# Patient Record
Sex: Female | Born: 1985
Health system: Southern US, Community
[De-identification: ages and names within clinical notes are randomized; demographics above are authoritative.]

## PROBLEM LIST (undated history)

## (undated) ENCOUNTER — Ambulatory Visit: Admission: EM | Payer: 59

## (undated) ENCOUNTER — Emergency Department (HOSPITAL_COMMUNITY): Discharge status: Absent without leave | Payer: Self-pay

## (undated) DIAGNOSIS — F32A Depression, unspecified: Secondary | ICD-10-CM

## (undated) DIAGNOSIS — G5600 Carpal tunnel syndrome, unspecified upper limb: Secondary | ICD-10-CM

## (undated) DIAGNOSIS — K5790 Diverticulosis of intestine, part unspecified, without perforation or abscess without bleeding: Secondary | ICD-10-CM

## (undated) DIAGNOSIS — E669 Obesity, unspecified: Secondary | ICD-10-CM

## (undated) DIAGNOSIS — F419 Anxiety disorder, unspecified: Secondary | ICD-10-CM

## (undated) HISTORY — PX: CHOLECYSTECTOMY: SHX55

## (undated) HISTORY — PX: WISDOM TOOTH EXTRACTION: SHX21

## (undated) HISTORY — DX: Depression, unspecified: F32.A

## (undated) HISTORY — DX: Carpal tunnel syndrome, unspecified upper limb: G56.00

## (undated) HISTORY — DX: Anxiety disorder, unspecified: F41.9

## (undated) HISTORY — PX: TUBAL LIGATION: SHX77

## (undated) HISTORY — PX: MOUTH SURGERY: SHX715

## (undated) HISTORY — DX: Obesity, unspecified: E66.9

---
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
## 2003-05-05 ENCOUNTER — Emergency Department (HOSPITAL_COMMUNITY): Admission: AD | Admit: 2003-05-05 | Discharge: 2003-05-05 | Payer: Self-pay | Admitting: Family Medicine

## 2003-08-06 ENCOUNTER — Inpatient Hospital Stay (HOSPITAL_COMMUNITY): Admission: AD | Admit: 2003-08-06 | Discharge: 2003-08-07 | Payer: Self-pay | Admitting: Obstetrics and Gynecology

## 2003-08-16 ENCOUNTER — Inpatient Hospital Stay (HOSPITAL_COMMUNITY): Admission: AD | Admit: 2003-08-16 | Discharge: 2003-08-16 | Payer: Self-pay | Admitting: Obstetrics and Gynecology

## 2003-09-11 ENCOUNTER — Inpatient Hospital Stay (HOSPITAL_COMMUNITY): Admission: AD | Admit: 2003-09-11 | Discharge: 2003-09-11 | Payer: Self-pay | Admitting: Obstetrics and Gynecology

## 2003-10-04 ENCOUNTER — Inpatient Hospital Stay (HOSPITAL_COMMUNITY): Admission: AD | Admit: 2003-10-04 | Discharge: 2003-10-04 | Payer: Self-pay | Admitting: Obstetrics and Gynecology

## 2003-11-08 ENCOUNTER — Inpatient Hospital Stay (HOSPITAL_COMMUNITY): Admission: AD | Admit: 2003-11-08 | Discharge: 2003-11-10 | Payer: Self-pay | Admitting: Obstetrics and Gynecology

## 2003-11-12 ENCOUNTER — Inpatient Hospital Stay (HOSPITAL_COMMUNITY): Admission: AD | Admit: 2003-11-12 | Discharge: 2003-11-12 | Payer: Self-pay | Admitting: Obstetrics and Gynecology

## 2003-12-01 ENCOUNTER — Observation Stay (HOSPITAL_COMMUNITY): Admission: AD | Admit: 2003-12-01 | Discharge: 2003-12-02 | Payer: Self-pay | Admitting: Obstetrics & Gynecology

## 2003-12-03 ENCOUNTER — Inpatient Hospital Stay (HOSPITAL_COMMUNITY): Admission: AD | Admit: 2003-12-03 | Discharge: 2003-12-03 | Payer: Self-pay | Admitting: Obstetrics and Gynecology

## 2003-12-04 ENCOUNTER — Observation Stay (HOSPITAL_COMMUNITY): Admission: AD | Admit: 2003-12-04 | Discharge: 2003-12-04 | Payer: Self-pay | Admitting: Obstetrics and Gynecology

## 2003-12-13 ENCOUNTER — Inpatient Hospital Stay (HOSPITAL_COMMUNITY): Admission: RE | Admit: 2003-12-13 | Discharge: 2003-12-16 | Payer: Self-pay | Admitting: Obstetrics and Gynecology

## 2003-12-13 ENCOUNTER — Encounter (INDEPENDENT_AMBULATORY_CARE_PROVIDER_SITE_OTHER): Payer: Self-pay | Admitting: Specialist

## 2004-01-04 ENCOUNTER — Emergency Department (HOSPITAL_COMMUNITY): Admission: EM | Admit: 2004-01-04 | Discharge: 2004-01-04 | Payer: Self-pay | Admitting: Emergency Medicine

## 2004-01-07 ENCOUNTER — Emergency Department (HOSPITAL_COMMUNITY): Admission: EM | Admit: 2004-01-07 | Discharge: 2004-01-08 | Payer: Self-pay | Admitting: Emergency Medicine

## 2004-01-31 ENCOUNTER — Emergency Department (HOSPITAL_COMMUNITY): Admission: EM | Admit: 2004-01-31 | Discharge: 2004-01-31 | Payer: Self-pay | Admitting: Emergency Medicine

## 2004-02-01 ENCOUNTER — Ambulatory Visit (HOSPITAL_COMMUNITY): Admission: RE | Admit: 2004-02-01 | Discharge: 2004-02-01 | Payer: Self-pay | Admitting: General Surgery

## 2004-02-01 ENCOUNTER — Encounter (INDEPENDENT_AMBULATORY_CARE_PROVIDER_SITE_OTHER): Payer: Self-pay | Admitting: *Deleted

## 2004-02-05 ENCOUNTER — Other Ambulatory Visit: Admission: RE | Admit: 2004-02-05 | Discharge: 2004-02-05 | Payer: Self-pay | Admitting: Obstetrics and Gynecology

## 2004-04-04 ENCOUNTER — Emergency Department (HOSPITAL_COMMUNITY): Admission: EM | Admit: 2004-04-04 | Discharge: 2004-04-04 | Payer: Self-pay

## 2004-04-25 ENCOUNTER — Emergency Department (HOSPITAL_COMMUNITY): Admission: EM | Admit: 2004-04-25 | Discharge: 2004-04-26 | Payer: Self-pay | Admitting: Emergency Medicine

## 2004-06-27 ENCOUNTER — Emergency Department (HOSPITAL_COMMUNITY): Admission: EM | Admit: 2004-06-27 | Discharge: 2004-06-27 | Payer: Self-pay | Admitting: Emergency Medicine

## 2004-06-29 ENCOUNTER — Emergency Department (HOSPITAL_COMMUNITY): Admission: EM | Admit: 2004-06-29 | Discharge: 2004-06-30 | Payer: Self-pay

## 2004-06-30 ENCOUNTER — Emergency Department (HOSPITAL_COMMUNITY): Admission: EM | Admit: 2004-06-30 | Discharge: 2004-06-30 | Payer: Self-pay | Admitting: *Deleted

## 2004-08-13 ENCOUNTER — Emergency Department (HOSPITAL_COMMUNITY): Admission: EM | Admit: 2004-08-13 | Discharge: 2004-08-13 | Payer: Self-pay | Admitting: Emergency Medicine

## 2004-10-05 ENCOUNTER — Emergency Department (HOSPITAL_COMMUNITY): Admission: EM | Admit: 2004-10-05 | Discharge: 2004-10-05 | Payer: Self-pay | Admitting: Emergency Medicine

## 2004-10-15 ENCOUNTER — Emergency Department (HOSPITAL_COMMUNITY): Admission: EM | Admit: 2004-10-15 | Discharge: 2004-10-15 | Payer: Self-pay | Admitting: Emergency Medicine

## 2004-11-23 ENCOUNTER — Emergency Department (HOSPITAL_COMMUNITY): Admission: EM | Admit: 2004-11-23 | Discharge: 2004-11-23 | Payer: Self-pay | Admitting: Emergency Medicine

## 2004-11-25 ENCOUNTER — Emergency Department (HOSPITAL_COMMUNITY): Admission: EM | Admit: 2004-11-25 | Discharge: 2004-11-26 | Payer: Self-pay | Admitting: Emergency Medicine

## 2004-12-16 ENCOUNTER — Emergency Department (HOSPITAL_COMMUNITY): Admission: EM | Admit: 2004-12-16 | Discharge: 2004-12-16 | Payer: Self-pay | Admitting: Emergency Medicine

## 2005-01-02 ENCOUNTER — Emergency Department (HOSPITAL_COMMUNITY): Admission: EM | Admit: 2005-01-02 | Discharge: 2005-01-02 | Payer: Self-pay | Admitting: Emergency Medicine

## 2005-01-10 ENCOUNTER — Emergency Department (HOSPITAL_COMMUNITY): Admission: EM | Admit: 2005-01-10 | Discharge: 2005-01-10 | Payer: Self-pay | Admitting: Emergency Medicine

## 2005-01-14 ENCOUNTER — Emergency Department (HOSPITAL_COMMUNITY): Admission: EM | Admit: 2005-01-14 | Discharge: 2005-01-14 | Payer: Self-pay | Admitting: Emergency Medicine

## 2005-01-15 ENCOUNTER — Emergency Department (HOSPITAL_COMMUNITY): Admission: EM | Admit: 2005-01-15 | Discharge: 2005-01-15 | Payer: Self-pay | Admitting: Emergency Medicine

## 2005-01-27 ENCOUNTER — Emergency Department (HOSPITAL_COMMUNITY): Admission: EM | Admit: 2005-01-27 | Discharge: 2005-01-28 | Payer: Self-pay | Admitting: Emergency Medicine

## 2005-01-28 ENCOUNTER — Emergency Department (HOSPITAL_COMMUNITY): Admission: EM | Admit: 2005-01-28 | Discharge: 2005-01-28 | Payer: Self-pay | Admitting: Emergency Medicine

## 2005-01-29 ENCOUNTER — Emergency Department (HOSPITAL_COMMUNITY): Admission: EM | Admit: 2005-01-29 | Discharge: 2005-01-29 | Payer: Self-pay | Admitting: *Deleted

## 2005-02-05 ENCOUNTER — Emergency Department (HOSPITAL_COMMUNITY): Admission: EM | Admit: 2005-02-05 | Discharge: 2005-02-05 | Payer: Self-pay | Admitting: Emergency Medicine

## 2005-02-11 ENCOUNTER — Emergency Department (HOSPITAL_COMMUNITY): Admission: EM | Admit: 2005-02-11 | Discharge: 2005-02-12 | Payer: Self-pay | Admitting: Emergency Medicine

## 2005-02-14 ENCOUNTER — Emergency Department (HOSPITAL_COMMUNITY): Admission: EM | Admit: 2005-02-14 | Discharge: 2005-02-14 | Payer: Self-pay | Admitting: Emergency Medicine

## 2005-02-23 ENCOUNTER — Emergency Department (HOSPITAL_COMMUNITY): Admission: EM | Admit: 2005-02-23 | Discharge: 2005-02-23 | Payer: Self-pay | Admitting: Emergency Medicine

## 2005-03-10 ENCOUNTER — Emergency Department (HOSPITAL_COMMUNITY): Admission: EM | Admit: 2005-03-10 | Discharge: 2005-03-10 | Payer: Self-pay | Admitting: Emergency Medicine

## 2005-03-17 ENCOUNTER — Emergency Department (HOSPITAL_COMMUNITY): Admission: EM | Admit: 2005-03-17 | Discharge: 2005-03-17 | Payer: Self-pay | Admitting: Emergency Medicine

## 2005-04-07 ENCOUNTER — Emergency Department (HOSPITAL_COMMUNITY): Admission: EM | Admit: 2005-04-07 | Discharge: 2005-04-07 | Payer: Self-pay | Admitting: Family Medicine

## 2005-04-16 ENCOUNTER — Emergency Department (HOSPITAL_COMMUNITY): Admission: EM | Admit: 2005-04-16 | Discharge: 2005-04-16 | Payer: Self-pay | Admitting: Emergency Medicine

## 2005-04-25 ENCOUNTER — Emergency Department (HOSPITAL_COMMUNITY): Admission: EM | Admit: 2005-04-25 | Discharge: 2005-04-25 | Payer: Self-pay | Admitting: Emergency Medicine

## 2005-05-18 ENCOUNTER — Emergency Department (HOSPITAL_COMMUNITY): Admission: EM | Admit: 2005-05-18 | Discharge: 2005-05-18 | Payer: Self-pay | Admitting: Family Medicine

## 2005-06-15 ENCOUNTER — Emergency Department (HOSPITAL_COMMUNITY): Admission: EM | Admit: 2005-06-15 | Discharge: 2005-06-16 | Payer: Self-pay | Admitting: Emergency Medicine

## 2005-06-26 ENCOUNTER — Emergency Department (HOSPITAL_COMMUNITY): Admission: EM | Admit: 2005-06-26 | Discharge: 2005-06-27 | Payer: Self-pay | Admitting: Emergency Medicine

## 2005-07-06 ENCOUNTER — Emergency Department (HOSPITAL_COMMUNITY): Admission: EM | Admit: 2005-07-06 | Discharge: 2005-07-06 | Payer: Self-pay | Admitting: Emergency Medicine

## 2005-08-10 ENCOUNTER — Emergency Department (HOSPITAL_COMMUNITY): Admission: EM | Admit: 2005-08-10 | Discharge: 2005-08-10 | Payer: Self-pay | Admitting: Emergency Medicine

## 2005-08-13 ENCOUNTER — Emergency Department (HOSPITAL_COMMUNITY): Admission: EM | Admit: 2005-08-13 | Discharge: 2005-08-14 | Payer: Self-pay | Admitting: Emergency Medicine

## 2005-08-18 ENCOUNTER — Inpatient Hospital Stay (HOSPITAL_COMMUNITY): Admission: AD | Admit: 2005-08-18 | Discharge: 2005-08-18 | Payer: Self-pay | Admitting: Obstetrics and Gynecology

## 2005-09-02 ENCOUNTER — Other Ambulatory Visit: Admission: RE | Admit: 2005-09-02 | Discharge: 2005-09-02 | Payer: Self-pay | Admitting: Obstetrics and Gynecology

## 2005-09-04 ENCOUNTER — Inpatient Hospital Stay (HOSPITAL_COMMUNITY): Admission: AD | Admit: 2005-09-04 | Discharge: 2005-09-04 | Payer: Self-pay | Admitting: Obstetrics and Gynecology

## 2005-09-04 ENCOUNTER — Encounter: Payer: Self-pay | Admitting: Emergency Medicine

## 2005-09-15 ENCOUNTER — Inpatient Hospital Stay (HOSPITAL_COMMUNITY): Admission: AD | Admit: 2005-09-15 | Discharge: 2005-09-16 | Payer: Self-pay | Admitting: Obstetrics and Gynecology

## 2005-09-21 ENCOUNTER — Inpatient Hospital Stay (HOSPITAL_COMMUNITY): Admission: AD | Admit: 2005-09-21 | Discharge: 2005-09-21 | Payer: Self-pay | Admitting: Obstetrics & Gynecology

## 2005-10-03 ENCOUNTER — Inpatient Hospital Stay (HOSPITAL_COMMUNITY): Admission: AD | Admit: 2005-10-03 | Discharge: 2005-10-03 | Payer: Self-pay | Admitting: Obstetrics and Gynecology

## 2005-10-12 ENCOUNTER — Inpatient Hospital Stay (HOSPITAL_COMMUNITY): Admission: AD | Admit: 2005-10-12 | Discharge: 2005-10-12 | Payer: Self-pay | Admitting: Obstetrics and Gynecology

## 2005-10-20 ENCOUNTER — Inpatient Hospital Stay (HOSPITAL_COMMUNITY): Admission: AD | Admit: 2005-10-20 | Discharge: 2005-10-21 | Payer: Self-pay | Admitting: Obstetrics and Gynecology

## 2005-11-24 ENCOUNTER — Inpatient Hospital Stay (HOSPITAL_COMMUNITY): Admission: AD | Admit: 2005-11-24 | Discharge: 2005-11-24 | Payer: Self-pay | Admitting: Obstetrics and Gynecology

## 2005-12-02 ENCOUNTER — Inpatient Hospital Stay (HOSPITAL_COMMUNITY): Admission: AD | Admit: 2005-12-02 | Discharge: 2005-12-03 | Payer: Self-pay | Admitting: Obstetrics and Gynecology

## 2005-12-28 ENCOUNTER — Inpatient Hospital Stay (HOSPITAL_COMMUNITY): Admission: AD | Admit: 2005-12-28 | Discharge: 2005-12-28 | Payer: Self-pay | Admitting: Obstetrics and Gynecology

## 2006-01-18 ENCOUNTER — Inpatient Hospital Stay (HOSPITAL_COMMUNITY): Admission: AD | Admit: 2006-01-18 | Discharge: 2006-01-19 | Payer: Self-pay | Admitting: Obstetrics & Gynecology

## 2006-01-30 ENCOUNTER — Inpatient Hospital Stay (HOSPITAL_COMMUNITY): Admission: AD | Admit: 2006-01-30 | Discharge: 2006-01-30 | Payer: Self-pay | Admitting: Obstetrics and Gynecology

## 2006-02-09 ENCOUNTER — Inpatient Hospital Stay (HOSPITAL_COMMUNITY): Admission: AD | Admit: 2006-02-09 | Discharge: 2006-02-10 | Payer: Self-pay | Admitting: Obstetrics and Gynecology

## 2006-02-11 ENCOUNTER — Inpatient Hospital Stay (HOSPITAL_COMMUNITY): Admission: AD | Admit: 2006-02-11 | Discharge: 2006-02-11 | Payer: Self-pay | Admitting: Obstetrics and Gynecology

## 2006-02-19 ENCOUNTER — Inpatient Hospital Stay (HOSPITAL_COMMUNITY): Admission: AD | Admit: 2006-02-19 | Discharge: 2006-02-19 | Payer: Self-pay | Admitting: Obstetrics and Gynecology

## 2006-02-28 ENCOUNTER — Inpatient Hospital Stay (HOSPITAL_COMMUNITY): Admission: AD | Admit: 2006-02-28 | Discharge: 2006-02-28 | Payer: Self-pay | Admitting: Obstetrics and Gynecology

## 2006-03-04 ENCOUNTER — Inpatient Hospital Stay (HOSPITAL_COMMUNITY): Admission: AD | Admit: 2006-03-04 | Discharge: 2006-03-04 | Payer: Self-pay | Admitting: Obstetrics and Gynecology

## 2006-03-12 ENCOUNTER — Inpatient Hospital Stay (HOSPITAL_COMMUNITY): Admission: AD | Admit: 2006-03-12 | Discharge: 2006-03-15 | Payer: Self-pay | Admitting: Obstetrics and Gynecology

## 2006-03-12 ENCOUNTER — Encounter (INDEPENDENT_AMBULATORY_CARE_PROVIDER_SITE_OTHER): Payer: Self-pay | Admitting: Specialist

## 2006-03-19 ENCOUNTER — Inpatient Hospital Stay (HOSPITAL_COMMUNITY): Admission: RE | Admit: 2006-03-19 | Discharge: 2006-03-19 | Payer: Self-pay | Admitting: Obstetrics and Gynecology

## 2006-03-20 ENCOUNTER — Inpatient Hospital Stay (HOSPITAL_COMMUNITY): Admission: AD | Admit: 2006-03-20 | Discharge: 2006-03-20 | Payer: Self-pay | Admitting: Obstetrics and Gynecology

## 2006-03-21 ENCOUNTER — Inpatient Hospital Stay (HOSPITAL_COMMUNITY): Admission: AD | Admit: 2006-03-21 | Discharge: 2006-03-21 | Payer: Self-pay | Admitting: *Deleted

## 2006-04-16 ENCOUNTER — Emergency Department (HOSPITAL_COMMUNITY): Admission: EM | Admit: 2006-04-16 | Discharge: 2006-04-16 | Payer: Self-pay | Admitting: Emergency Medicine

## 2006-05-30 ENCOUNTER — Emergency Department (HOSPITAL_COMMUNITY): Admission: EM | Admit: 2006-05-30 | Discharge: 2006-05-31 | Payer: Self-pay | Admitting: Emergency Medicine

## 2006-06-06 ENCOUNTER — Emergency Department (HOSPITAL_COMMUNITY): Admission: EM | Admit: 2006-06-06 | Discharge: 2006-06-06 | Payer: Self-pay | Admitting: Emergency Medicine

## 2006-06-17 ENCOUNTER — Emergency Department (HOSPITAL_COMMUNITY): Admission: EM | Admit: 2006-06-17 | Discharge: 2006-06-17 | Payer: Self-pay | Admitting: Emergency Medicine

## 2006-07-23 ENCOUNTER — Emergency Department (HOSPITAL_COMMUNITY): Admission: EM | Admit: 2006-07-23 | Discharge: 2006-07-23 | Payer: Self-pay | Admitting: Emergency Medicine

## 2006-08-10 ENCOUNTER — Emergency Department (HOSPITAL_COMMUNITY): Admission: EM | Admit: 2006-08-10 | Discharge: 2006-08-10 | Payer: Self-pay | Admitting: Emergency Medicine

## 2006-09-06 ENCOUNTER — Emergency Department (HOSPITAL_COMMUNITY): Admission: EM | Admit: 2006-09-06 | Discharge: 2006-09-06 | Payer: Self-pay | Admitting: Emergency Medicine

## 2006-10-17 ENCOUNTER — Emergency Department (HOSPITAL_COMMUNITY): Admission: EM | Admit: 2006-10-17 | Discharge: 2006-10-18 | Payer: Self-pay | Admitting: Emergency Medicine

## 2006-10-24 ENCOUNTER — Emergency Department (HOSPITAL_COMMUNITY): Admission: EM | Admit: 2006-10-24 | Discharge: 2006-10-24 | Payer: Self-pay | Admitting: *Deleted

## 2006-11-15 ENCOUNTER — Emergency Department (HOSPITAL_COMMUNITY): Admission: EM | Admit: 2006-11-15 | Discharge: 2006-11-15 | Payer: Self-pay | Admitting: Emergency Medicine

## 2006-12-02 ENCOUNTER — Emergency Department (HOSPITAL_COMMUNITY): Admission: EM | Admit: 2006-12-02 | Discharge: 2006-12-02 | Payer: Self-pay | Admitting: Emergency Medicine

## 2006-12-04 ENCOUNTER — Emergency Department (HOSPITAL_COMMUNITY): Admission: EM | Admit: 2006-12-04 | Discharge: 2006-12-04 | Payer: Self-pay | Admitting: Emergency Medicine

## 2006-12-05 ENCOUNTER — Emergency Department (HOSPITAL_COMMUNITY): Admission: EM | Admit: 2006-12-05 | Discharge: 2006-12-05 | Payer: Self-pay | Admitting: Emergency Medicine

## 2006-12-08 ENCOUNTER — Emergency Department (HOSPITAL_COMMUNITY): Admission: EM | Admit: 2006-12-08 | Discharge: 2006-12-08 | Payer: Self-pay | Admitting: Emergency Medicine

## 2006-12-13 ENCOUNTER — Emergency Department (HOSPITAL_COMMUNITY): Admission: EM | Admit: 2006-12-13 | Discharge: 2006-12-13 | Payer: Self-pay | Admitting: Emergency Medicine

## 2007-01-16 ENCOUNTER — Emergency Department (HOSPITAL_COMMUNITY): Admission: EM | Admit: 2007-01-16 | Discharge: 2007-01-16 | Payer: Self-pay | Admitting: Emergency Medicine

## 2007-02-12 ENCOUNTER — Emergency Department (HOSPITAL_COMMUNITY): Admission: EM | Admit: 2007-02-12 | Discharge: 2007-02-12 | Payer: Self-pay | Admitting: Family Medicine

## 2007-03-06 ENCOUNTER — Emergency Department (HOSPITAL_COMMUNITY): Admission: EM | Admit: 2007-03-06 | Discharge: 2007-03-06 | Payer: Self-pay | Admitting: Emergency Medicine

## 2007-03-30 ENCOUNTER — Emergency Department (HOSPITAL_COMMUNITY): Admission: EM | Admit: 2007-03-30 | Discharge: 2007-03-30 | Payer: Self-pay | Admitting: Emergency Medicine

## 2007-04-11 ENCOUNTER — Emergency Department (HOSPITAL_COMMUNITY): Admission: EM | Admit: 2007-04-11 | Discharge: 2007-04-11 | Payer: Self-pay | Admitting: Emergency Medicine

## 2007-06-05 ENCOUNTER — Emergency Department (HOSPITAL_COMMUNITY): Admission: EM | Admit: 2007-06-05 | Discharge: 2007-06-06 | Payer: Self-pay | Admitting: Emergency Medicine

## 2007-07-01 ENCOUNTER — Emergency Department (HOSPITAL_COMMUNITY): Admission: EM | Admit: 2007-07-01 | Discharge: 2007-07-01 | Payer: Self-pay | Admitting: Family Medicine

## 2007-07-02 ENCOUNTER — Emergency Department (HOSPITAL_COMMUNITY): Admission: EM | Admit: 2007-07-02 | Discharge: 2007-07-02 | Payer: Self-pay | Admitting: Emergency Medicine

## 2007-08-31 ENCOUNTER — Emergency Department (HOSPITAL_COMMUNITY): Admission: EM | Admit: 2007-08-31 | Discharge: 2007-08-31 | Payer: Self-pay | Admitting: Emergency Medicine

## 2007-09-11 ENCOUNTER — Emergency Department (HOSPITAL_COMMUNITY): Admission: EM | Admit: 2007-09-11 | Discharge: 2007-09-11 | Payer: Self-pay | Admitting: Emergency Medicine

## 2007-10-11 ENCOUNTER — Emergency Department (HOSPITAL_COMMUNITY): Admission: EM | Admit: 2007-10-11 | Discharge: 2007-10-11 | Payer: Self-pay | Admitting: Family Medicine

## 2007-11-16 ENCOUNTER — Emergency Department (HOSPITAL_COMMUNITY): Admission: EM | Admit: 2007-11-16 | Discharge: 2007-11-16 | Payer: Self-pay | Admitting: Emergency Medicine

## 2007-11-18 ENCOUNTER — Emergency Department (HOSPITAL_COMMUNITY): Admission: EM | Admit: 2007-11-18 | Discharge: 2007-11-18 | Payer: Self-pay | Admitting: Emergency Medicine

## 2008-08-09 ENCOUNTER — Emergency Department (HOSPITAL_COMMUNITY): Admission: EM | Admit: 2008-08-09 | Discharge: 2008-08-09 | Payer: Self-pay | Admitting: Internal Medicine

## 2008-09-19 ENCOUNTER — Emergency Department (HOSPITAL_COMMUNITY): Admission: EM | Admit: 2008-09-19 | Discharge: 2008-09-19 | Payer: Self-pay | Admitting: Emergency Medicine

## 2009-01-04 ENCOUNTER — Emergency Department (HOSPITAL_COMMUNITY): Admission: EM | Admit: 2009-01-04 | Discharge: 2009-01-04 | Payer: Self-pay | Admitting: Emergency Medicine

## 2009-04-05 ENCOUNTER — Emergency Department (HOSPITAL_COMMUNITY): Admission: EM | Admit: 2009-04-05 | Discharge: 2009-04-06 | Payer: Self-pay | Admitting: Emergency Medicine

## 2009-04-17 ENCOUNTER — Emergency Department (HOSPITAL_COMMUNITY): Admission: EM | Admit: 2009-04-17 | Discharge: 2009-04-17 | Payer: Self-pay | Admitting: Emergency Medicine

## 2009-09-04 ENCOUNTER — Emergency Department (HOSPITAL_COMMUNITY): Admission: EM | Admit: 2009-09-04 | Discharge: 2009-09-05 | Payer: Self-pay | Admitting: Emergency Medicine

## 2010-01-11 ENCOUNTER — Emergency Department (HOSPITAL_COMMUNITY): Admission: EM | Admit: 2010-01-11 | Discharge: 2010-01-11 | Payer: Self-pay | Admitting: Emergency Medicine

## 2010-02-04 ENCOUNTER — Emergency Department (HOSPITAL_COMMUNITY): Admission: EM | Admit: 2010-02-04 | Discharge: 2010-02-04 | Payer: Self-pay | Admitting: Emergency Medicine

## 2010-02-09 ENCOUNTER — Emergency Department (HOSPITAL_COMMUNITY): Admission: EM | Admit: 2010-02-09 | Discharge: 2010-02-10 | Payer: Self-pay | Admitting: Emergency Medicine

## 2010-07-13 ENCOUNTER — Encounter: Payer: Self-pay | Admitting: Emergency Medicine

## 2010-08-24 ENCOUNTER — Emergency Department (HOSPITAL_COMMUNITY)
Admission: EM | Admit: 2010-08-24 | Discharge: 2010-08-24 | Disposition: A | Discharge status: Home / Self Care | Payer: Medicaid Other | Attending: Emergency Medicine | Admitting: Emergency Medicine

## 2010-08-24 DIAGNOSIS — R197 Diarrhea, unspecified: Secondary | ICD-10-CM | POA: Insufficient documentation

## 2010-08-24 DIAGNOSIS — R112 Nausea with vomiting, unspecified: Secondary | ICD-10-CM | POA: Insufficient documentation

## 2010-08-24 DIAGNOSIS — R109 Unspecified abdominal pain: Secondary | ICD-10-CM | POA: Insufficient documentation

## 2010-09-25 LAB — DIFFERENTIAL
Basophils Absolute: 0.1 10*3/uL (ref 0.0–0.1)
Basophils Relative: 1 % (ref 0–1)
Eosinophils Absolute: 0.3 10*3/uL (ref 0.0–0.7)
Eosinophils Relative: 3 % (ref 0–5)
Monocytes Absolute: 0.6 10*3/uL (ref 0.1–1.0)
Monocytes Relative: 6 % (ref 3–12)
Neutro Abs: 6.6 10*3/uL (ref 1.7–7.7)

## 2010-09-25 LAB — BASIC METABOLIC PANEL
CO2: 25 mEq/L (ref 19–32)
Calcium: 8.7 mg/dL (ref 8.4–10.5)
Chloride: 111 mEq/L (ref 96–112)
Creatinine, Ser: 0.58 mg/dL (ref 0.4–1.2)
GFR calc Af Amer: >60 mL/min (ref >60)
Glucose, Bld: 98 mg/dL (ref 70–99)
Sodium: 140 mEq/L (ref 135–145)

## 2010-09-25 LAB — CBC
Hemoglobin: 12.8 g/dL (ref 12.0–15.0)
MCHC: 35 g/dL (ref 30.0–36.0)
MCV: 80.4 fL (ref 78.0–100.0)
RBC: 4.52 MIL/uL (ref 3.87–5.11)
RDW: 13.6 % (ref 11.5–15.5)

## 2010-10-05 ENCOUNTER — Inpatient Hospital Stay (INDEPENDENT_AMBULATORY_CARE_PROVIDER_SITE_OTHER)
Admission: RE | Admit: 2010-10-05 | Discharge: 2010-10-05 | Disposition: A | Discharge status: Home / Self Care | Payer: Medicaid Other | Source: Ambulatory Visit | Attending: Family Medicine | Admitting: Family Medicine

## 2010-10-05 DIAGNOSIS — R197 Diarrhea, unspecified: Secondary | ICD-10-CM

## 2010-10-05 DIAGNOSIS — R112 Nausea with vomiting, unspecified: Secondary | ICD-10-CM

## 2010-10-07 LAB — URINALYSIS, ROUTINE W REFLEX MICROSCOPIC
Hgb urine dipstick: NEGATIVE
Ketones, ur: NEGATIVE mg/dL
Nitrite: NEGATIVE
Protein, ur: NEGATIVE mg/dL
Urobilinogen, UA: 0.2 mg/dL (ref 0.0–1.0)

## 2010-11-07 NOTE — Op Note (Signed)
NAME:  Stacy [name], Stacy [name]                           ACCOUNT NO.:  [id number]   MEDICAL RECORD NO.:  [id number]                   PATIENT TYPE:  INP   LOCATION:  9123                                 FACILITY:  [facility]   PHYSICIAN:  Guy Sandifer. Arleta Creek, M.D.           DATE OF BIRTH:  12-25-1985   DATE OF PROCEDURE:  12/13/2003  DATE OF DISCHARGE:                                 OPERATIVE REPORT   PREOPERATIVE DIAGNOSES:  1. Intrauterine pregnancy at 37-4/7 weeks estimated gestational age.  2. Advanced cervical dilation.  3. Active vulvar herpes.   POSTOPERATIVE DIAGNOSES:  1. Intrauterine pregnancy at 37-4/7 weeks estimated gestational age.  2. Advanced cervical dilation.  3. Active vulvar herpes.   PROCEDURE:  Low transverse cesarean section.   SURGEON:  Guy Sandifer. Henderson Cloud, M.D.   ANESTHESIA:  Epidural per Burnett Corrente, M.D.   ESTIMATED BLOOD LOSS:  800 mL.   SPECIMENS:  Placenta.   FINDINGS:  Viable female infant with Apgars of 9 and 9 at one and five  minutes, respectively.  Birth weight 5 pounds 15 ounces.  Arterial cord pH  7.27.   INDICATIONS FOR PROCEDURE:  This patient is a 25 year old single white  female G2, P0 with an EDC of December 30, 2003, who was 4 cm dilated, completely  effaced and +1 station with prodromal symptoms of a recurrent vulvar HSV  lesion.  Details are dictated in the history and physical.  Cesarean section  is discussed.  Potential risks and complications have been discussed  preoperatively including, but not limited to, infection, bowel, bladder,  ureteral damage, bleeding requiring transfusion of blood products and  possible transfusion reaction, HIV and hepatitis acquisition, DVT, PE,  pneumonia.  All questions have been answered and consent is signed on the  chart.   DESCRIPTION OF PROCEDURE:  The patient is taken to the operating room where  she is identified. Spinal anesthetic is placed and she is placed in the  dorsal supine position with a 15  degree left lateral wedge.  She is prepped  abdominally.  Foley catheter is placed in the bladder and she is draped in  sterile fashion.  After testing for adequate spinal anesthesia, skin is  entered through a Pfannenstiel incision and dissection is carried out in  layers to the peritoneum.  The peritoneum is incised and extended superiorly  and inferiorly.  Vesicouterine peritoneum is taken down cephalolaterally.  The bladder flap is developed and bladder blade is placed.  Uterus is  incised in a low transverse manner. The uterine cavity is entered bluntly  with a Kelly clamp.  The uterine incision is extended cephalolaterally with  the fingers.  Clear fluid is noted.  Baby is occiput posterior.  Vertex is  delivered without difficulty and oral and nasopharynx are suctioned.  Nuchal  cord x2 is reduced.  The baby is then delivered.  Good cry and tone is  noted.  Cord is clamped and cut and the baby is handed to the awaiting  pediatrics team.  Placenta is manually delivered.  Uterus is clean.  Uterus  is closed in a running locking layer of 0 Monocryl and a second imbricating  layer of 0 Monocryl.  Anterior peritoneum is closed in a running fashion  with 0 Monocryl suture which is also used to reapproximate the pyramidalis  muscle in the midline.  Anterior rectus fascia is closed in a running  fashion with 0 PDS suture and the skin is closed with clips.  Sponge, needle  and instrument counts were correct and the patient was transferred to the  recovery room in stable condition.                                               Guy Sandifer Arleta Creek, M.D.    JET/MEDQ  D:  12/13/2003  T:  12/13/2003  Job:  [id number]

## 2010-11-07 NOTE — Discharge Summary (Signed)
NAMEMARVELINE, Stacy [name]                 ACCOUNT NO.:  [id number]   MEDICAL RECORD NO.:  [id number]          PATIENT TYPE:  INP   LOCATION:  9136                          FACILITY:  [facility]   PHYSICIAN:  Guy Sandifer. Henderson Cloud, M.D. DATE OF BIRTH:  May 23, 1986   DATE OF ADMISSION:  03/12/2006  DATE OF DISCHARGE:  03/15/2006                                 DISCHARGE SUMMARY   ADMITTING DIAGNOSES:  1. Intrauterine pregnancy at 36-6/7th's weeks' estimated gestational age.  2. Spontaneous onset of labor.  3. Previous cesarean section, declines vaginal birth after delivery.  4. Active HSV.  5. Multiparity, desires permanent sterilization.   DISCHARGE DIAGNOSES:  1. Status post low transverse cesarean section.  2. Viable female infant.  3. Permanent sterilization.   PROCEDURE:  1. Repeat low transverse cesarean section.  2. Bilateral tubal ligation, modified Pomeroy procedure.   REASON FOR ADMISSION:  Please see written H&P.   HOSPITAL COURSE:  The patient is 25 year old gravida 2, para 1, that  presented to [facility] at 36-6/7th's weeks in active  labor.  The patient had had a previous cesarean section and desired repeat.  The patient also was known to have positive HSV outbreak.  Due to  multiparity, the patient also requested permanent sterilization.  On  admission, vital signs were stable.  Fetal heart tones were reactive.  Contractions were noted to be regular.  The patient was then transferred to  the operating room where spinal anesthesia was administered without  difficulty.  A low transverse incision was made with delivery of a viable  female infant, weighing 5 pounds 2 ounces, Apgar's of 9 at 1 minute and 9 at  5 minutes.  Bilateral tubal ligation was performed without difficulty.  The  patient tolerated procedure well and was taken to the recovery room in  stable condition.  On postoperative day #1, the patient was without complaint.  Vital signs  stable.  Urine  output was good.  Fundus was firm and nontender.  Abdominal  dressing was noted to be clean, dry and intact.  Laboratory findings  revealed hemoglobin of 11.2, platelet count 165,000, WBC count of 12.7.  On postoperative day #2, the patient was doing well.  Vital signs continued  be stable.  She was afebrile.  Fundus firm and nontender.  Abdominal  dressing had been removed revealing incision that is clean, dry and intact.  On postoperative day #3, the patient was without complaint.  Vital signs  were stable.  Fundus firm and nontender.  Incision was clean, dry and  intact.  Staples were removed and the patient was discharged home.   CONDITION ON DISCHARGE:  Good.   DIET:  Regular, as tolerated.   ACTIVITY:  No heavy lifting, no driving x2 weeks, no vaginal entry.   FOLLOWUP:  Patient to follow up in the office in 2 weeks for an incision  check.   She is to call for temperature greater than 100 degrees, persistent nausea,  vomiting, heavy vaginal bleeding and/or redness or drainage from incisional  site.   DISCHARGE MEDICATIONS:  1. Percocet  5/325, #30, one p.o. every four to six hours p.r.n.  2. Motrin 600 mg every 6 hours.  3. Prenatal vitamins one p.o. daily.  4. Colace one p.o. daily p.r.n.      Julio Sicks, N.P.      Guy Sandifer. Henderson Cloud, M.D.  Electronically Signed    CC/MEDQ  D:  04/02/2006  T:  04/03/2006  Job:  [id number]

## 2010-11-07 NOTE — Discharge Summary (Signed)
[name], [name]                 ACCOUNT NO.:  [id number]   MEDICAL RECORD NO.:  [id number]          PATIENT TYPE:  INP   LOCATION:  9158                          FACILITY:  [facility]   PHYSICIAN:  Juluis Mire, M.D.   DATE OF BIRTH:  January 05, 1986   DATE OF ADMISSION:  02/09/2006  DATE OF DISCHARGE:  02/10/2006                                 DISCHARGE SUMMARY   ADMISSION DIAGNOSIS:  Intrauterine pregnancy at 33-4/7 with preterm uterine  activity.  Prior cesarean section for repeat.   DISCHARGE DIAGNOSIS:  Intrauterine pregnancy at 33-4/7 with preterm uterine  activity.  Prior cesarean section for repeat.   For complete history and physical, please see written note.   HOSPITAL COURSE:  Patient brought in and begun on IV hydration as well as  p.o. Terbutaline.  She had a resolution of uterine activity.  Fetal  fibronectin was negative.  Next morning the cervix was 2, 50% which was  basically unchanged from in the office.  Evidently had been monitored with a  reactive tracing.  In view of negative fibronectin, resolution of uterine  activity, she will be sent home.  She had an ultrasound yesterday that was  unremarkable. Cervical length 3.7 cm.  Lab work was otherwise unremarkable.   __________  stay in the hospital.  Discharged home in stable condition.   DISPOSITION:  Will be resting at home.  Continue p.o. Terbutaline.  Preterm  labor warning signs are given.  Will follow up in the office in one to two  days.      Juluis Mire, M.D.  Electronically Signed     JSM/MEDQ  D:  02/10/2006  T:  02/10/2006  Job:  [id number]

## 2010-11-07 NOTE — Op Note (Signed)
NAME:  Stacy [name], STRASSNER                           ACCOUNT NO.:  [id number]   MEDICAL RECORD NO.:  [id number]                   PATIENT TYPE:  AMB   LOCATION:  DAY                                  FACILITY:  [facility]   PHYSICIAN:  Timothy E. Earlene Plater, M.D.              DATE OF BIRTH:  1986-02-29   DATE OF PROCEDURE:  DATE OF DISCHARGE:                                 OPERATIVE REPORT   PREOPERATIVE DIAGNOSES:  Chronic cholecystolithiasis.   POSTOPERATIVE DIAGNOSES:  Chronic cholecystolithiasis.   PROCEDURE:  Laparoscopic cholecystectomy and cholangiogram.   SURGEON:  Timothy E. Earlene Plater, M.D.   ASSISTANT:  Lebron Conners, M.D.   ANESTHESIA:  General.   Ms. Stacy [name] is otherwise healthy, 25 years old, overweight, recent postpartum  with symptomatic gallstones, normal liver function studies. After careful  discussion she is ready to proceed with surgery. She was identified and the  permit signed.   She was taken to the operating room, placed supine, general endotracheal  anesthesia administered.  The abdomen was prepped and draped in the usual  fashion.  Marcaine 0.25% with epinephrine is used prior to each incision. A  vertical infraumbilical incision made, the fascia identified, opened in the  midline, peritoneum entered without complications.  The Hasson catheter  placed, tied in place with a #1 Vicryl.  The abdomen was insufflated,  general peritoneoscopy was unremarkable except for a small amount of dilute  blood in the pelvis. A second 10 mm trocar was placed in the mid  epigastrium, two 5 mm trocars in the right upper quadrant.  Instruments were  applied, the gallbladder was grasped, placed on tension, there were no  adhesions.  Gentle dissection at the base of the gallbladder revealed a  normal appearing cystic duct, this was cleared and dissected free.  Behind  that a window was created revealing an anterior and a posterior artery both  of which were dissected. A clip was placed on  the gallbladder side of the  cystic duct, the cystic duct was opened. A percutaneously passed Cook  catheter was introduced into the cystic duct and then using real-time  fluoroscopy and 1/2 strength dye, a cholangiogram was made.  There was  complete filling of the biliary tree and free flow of dye into the duodenum.  The [facility] catheter were removed, the base of the cystic duct was doubly  clipped, it was fully divided. The arteries were then triply clipped and  each divided and then the gallbladder was dissected from the gallbladder bed  without incident or complication. The gallbladder bed was dry, the irrigant  was clear.  The gallbladder was removed through the infraumbilical incision  which was then closed under direct vision with a #1 Vicryl tie. Again  irrigant was clear. Counts were correct, all irrigation, CO2,  instruments and trocars removed from the abdomen. Each skin incision was  checked and then closed with 3-0 Monocryl,  Steri-Strips applied.  Final  counts correct, dry sterile dressing applied. She was awakened and taken to  the recovery room in good condition.                                               Timothy E. Earlene Plater, M.D.    TED/MEDQ  D:  02/01/2004  T:  02/02/2004  Job:  [id number]   cc:   Marne A. Milinda Antis, M.D. [facility]

## 2010-11-07 NOTE — Op Note (Signed)
Stacy [name], Stacy [name]                 ACCOUNT NO.:  [id number]   MEDICAL RECORD NO.:  [id number]          PATIENT TYPE:  INP   LOCATION:  9136                          FACILITY:  [facility]   PHYSICIAN:  Michelle L. Grewal, M.D.DATE OF BIRTH:  1985-08-20   DATE OF PROCEDURE:  03/15/2006  DATE OF DISCHARGE:  03/15/2006                                 OPERATIVE REPORT   PREOPERATIVE DIAGNOSES:  1. Intrauterine pregnancy at 49 and 6.  2. Labor.  3. Previous cesarean section, declines vaginal birth after cesarean      section.  4. Active herpes simplex virus outbreak.  5. Desires permanent sterilization.   POSTOPERATIVE DIAGNOSES:  1. Intrauterine pregnancy at 53 and 6.  2. Labor.  3. Previous cesarean section, declines vaginal birth after cesarean      section.  4. Active herpes simplex virus outbreak.  5. Desires permanent sterilization.   PROCEDURES:  1. Repeat low transverse cesarean section  2. Modified Pomeroy bilateral tubal ligation.   SURGEON:  Michelle L. Vincente Poli, M.D.   ANESTHESIA:  Spinal.   SPECIMENS:  Female infant with Apgars 9 at 1 minute and 9 at 5 minutes.   ESTIMATED BLOOD LOSS:  200 mL.   PATHOLOGY:  Fallopian tube segments.   DRAINS:  Foley.   COMPLICATIONS:  None.   PROCEDURE:  Patient is taken to the operating room from [facility].  Her spinal was  placed by Dr. Pamalee Leyden.  She is prepped and draped in a Foley catheter is  inserted.  A low transverse incision was made, carried down to the fascia.  Fascia was scored in the midline and extended laterally.  The rectus muscles  were separated in the midline and the peritoneum was entered bluntly.  The  peritoneal incision was stretched.  The lower uterine segment was  identified.  The bladder flap was created sharply and then digitally and the  bladder blade was readjusted.  A low transverse incision was made in the  uterus.  The uterus was entered using a hemostat.  The baby's head was at  the incision and was  delivered quite easily with one gentle pull of the  vacuum with no pop-offs.  The baby was a female infant with Apgars of 9 at 1  minute and 9 at 5 minutes. NICU was present at the delivery.  After the baby  was delivered and cord was clamped and cut, the placenta was manually  removed and noted be normal and intact with three-vessel cord.  The uterus  was exteriorized and cleared of all clots and debris.  The uterine incision  was closed in one layer using 0 chromic in continuous running locked stitch.  It was hemostatic.  The patient had signed her tubal ligation papers and  prior to surgery, had voiced that she wanted to have permanent  sterilization.  The fallopian tubes were then identified.  The fimbriated  end on each side was noted.  The mid portion of each tube on each side was  grasped using the Babcock clamp.  A 2 cm knuckle was tied off on  each side  using plain gut suture and that knuckle of tissue on each side was  separately excised using Metzenbaum scissors.  Hemostasis was excellent.  The uterus was returned to the abdomen and irrigation performed.  All  surgical sites were inspected and hemostasis was noted.  The peritoneum was  closed using 0 Vicryl in a running stitch.  The rectus muscles were  reapproximated using 0 Vicryl.  The fascia was closed using 0 Vicryl in  continuous running stitch starting at each corner and meeting in the  midline.  After irrigation of the subcutaneous layers, skin was closed with  staples.  All sponge, lap and instrument counts were correct x2.  The  patient went to recovery room in stable condition.      Michelle L. Vincente Poli, M.D.  Electronically Signed     MLG/MEDQ  D:  03/12/2006  T:  03/15/2006  Job:  [id number]

## 2010-11-07 NOTE — Discharge Summary (Signed)
NAME:  Stacy [name], Stacy [name]                           ACCOUNT NO.:  [id number]   MEDICAL RECORD NO.:  [id number]                   PATIENT TYPE:  INP   LOCATION:  9123                                 FACILITY:  [facility]   PHYSICIAN:  Michelle L. Vincente Poli, M.D.            DATE OF BIRTH:  1985/08/16   DATE OF ADMISSION:  12/13/2003  DATE OF DISCHARGE:  12/16/2003                                 DISCHARGE SUMMARY   ADMISSION DIAGNOSES:  1.  Intrauterine pregnancy at  37-4/7 weeks estimated gestational age.  2.  Active herpes simplex virus.  3.  Advanced cervical dilatation.   DISCHARGE DIAGNOSES:  1.  Status post low transverse cesarean section.  2.  Viable female infant.   PROCEDURE:  Primary low transverse cesarean section.   REASON FOR ADMISSION:  Please see dictated H&P.   HOSPITAL COURSE:  The patient was a 25 year old white single female, gravida  2, para 0, who was admitted to [facility] for a cesarean  section.  The patient had been seen in the office with prodromal HSV  symptoms for approximately 3 days.  No obvious vulvar lesions were noted.  Cervix was noted to be unchanged at 4 cm dilated, completely effaced, in a  +1 station.  The patient was not leaking any amniotic fluid.  She did  complain of some decreased fetal movement.  An NST in the office had been  performed which was reactive.  In view of her advanced cervical dilatation  and low station with HSV prodromal symptoms for the past 3 days, cesarean  section was recommend.  The patient was then taken to the operating room  where spinal anesthesia was administered without difficulty.  A low  transverse incision was made with the delivery of a viable female infant,  weighing 5 pounds 15 ounces with Apgars of 9 at one minute, 9 at five  minute.  Arterial cord pH was 7.27.  The patient tolerated the procedure  well and was taken to the recovery room in stable condition.  On  postoperative day one, the patient was  without complaint.  Vital signs were  stable.  She was afebrile.  Abdomen was soft with good return of bowel  function.  Fundus was firm and nontender.  Abdominal dressing was noted to  be clean, dry, and intact.  Labs revealed a hemoglobin of 9.7, platelet  count 197,000, WBC count of 9.4.  On postoperative day two, the patient was  doing well.  She was tolerating a regular diet without complaints of nausea  and vomiting.  Vital signs were stable.  She was afebrile.  Fundus was firm  and nontender.  Incision was clean, dry, and intact.  On postoperative day  three, the patient was doing well.  Vital signs remained stable.  She was  afebrile.  Fundus was firm and nontender.  Incision was clean, dry, and  intact.  Staples  were removed.  The patient was discharged home.   CONDITION ON DISCHARGE:  Good.   DIET:  Regular as tolerated.   ACTIVITY:  No heavy lifting.  No driving x 2 weeks.  No vaginal entry.   FOLLOW UP:  The patient is to follow up in the office in 1-2 weeks for an  incision check.  She is to call for temperature greater than 100 degrees,  persistent nausea and vomiting, heavy vaginal bleeding, and/or redness or  drainage from the incisional site.   DISCHARGE MEDICATIONS:  1.  Tylox #30, 1 p.o. q.4-6h. p.r.n.  2.  Motrin 600 mg q.6h. p.r.n.  3.  Prenatal vitamins 1 p.o. daily.  4.  Colace 1 p.o. daily p.r.n.     Julio Sicks, N.P.                        Stann Mainland. Vincente Poli, M.D.    CC/MEDQ  D:  02/22/2004  T:  02/25/2004  Job:  [id number]

## 2010-11-07 NOTE — H&P (Signed)
NAME:  Stacy [name], Stacy [name]                           ACCOUNT NO.:  [id number]   MEDICAL RECORD NO.:  [id number]                   PATIENT TYPE:  INP   LOCATION:  NA                                   FACILITY:  [facility]   PHYSICIAN:  Guy Sandifer. Arleta Creek, M.D.           DATE OF BIRTH:  04-07-1986   DATE OF ADMISSION:  12/13/2003  DATE OF DISCHARGE:                                HISTORY & PHYSICAL   HISTORY OF PRESENT ILLNESS:  This patient is a 25 year old, single white  female, G2, P0 with an EDC of December 30, 2003 which is consistent with 13 week  examination and 18 5/7 week ultrasound placing her at 37 4/7 weeks.  She has  a history of vulvar HSV.  On December 04, 2003, she was 36 3/7 weeks. The cervix  was 3-4 cm dilated, completely effaced and plus 1 station. She had no  prodromal HSV symptoms and no lesions. She was Valtrex daily.  On December 12, 2003, she returned to the office complaining of three days of HSV prodromal  type symptoms.  No obvious vulvar lesions are noted.  The cervix is  unchanged at 4 cm dilated, completely effaced and +1 station.  No symptoms  of leaking fluid.  She did complain of some decreased fetal movement and NST  is reactive.  In view of her advanced cervical dilation and low station with  the HSV prodromal symptoms for the past three days, cesarean section is  recommended.  The potential risks and complications have been reviewed.   Past medical history, past surgical history, OB history, family history see  prenatal history and physical.   MEDICATIONS:  Prenatal vitamins.   ALLERGIES:  No known drug allergies.   SOCIAL HISTORY:  The patient denies tobacco, alcohol or drug abuse.   REVIEW OF SYMPTOMS:  NEUROLOGIC:  Denies recent headache.  CARDIO:  Denies  chest pain.  PULMONARY:  Denies shortness of breath.   PHYSICAL EXAMINATION:  VITAL SIGNS:  Height 5 feet 7 inches, weight 260 1/2  pounds, blood pressure 108/68.  HEENT:  Without thyromegaly.  LUNGS:  Clear to  auscultation.  HEART:  Regular rate and rhythm.  BREASTS:  Not examined.  ABDOMEN:  Gravid, nontender.  Cervix as above.  EXTREMITIES:  2+ edema, deep tendon reflexes 2+ without clonus.   LABORATORY DATA:  blood type O positive, Rh antibody screen negative,  toxoplasmosis negative, RPR nonreactive, rubella titer immune, HSV surface  antigen negative, HIV nonreactive, gonorrhea and chlamydia negative.   ASSESSMENT:  Intrauterine pregnancy at 37 4/7 weeks with advanced cervical  dilation and prodromal symptoms of vulvar HSV.   PLAN:  Cesarean section.  Guy Sandifer Arleta Creek, M.D.    JET/MEDQ  D:  12/12/2003  T:  12/12/2003  Job:  [id number]

## 2010-11-20 ENCOUNTER — Emergency Department (HOSPITAL_COMMUNITY): Payer: Medicaid Other

## 2010-11-20 ENCOUNTER — Emergency Department (HOSPITAL_COMMUNITY)
Admission: EM | Admit: 2010-11-20 | Discharge: 2010-11-20 | Disposition: A | Discharge status: Home / Self Care | Payer: Medicaid Other | Attending: Emergency Medicine | Admitting: Emergency Medicine

## 2010-11-20 DIAGNOSIS — M25569 Pain in unspecified knee: Secondary | ICD-10-CM | POA: Insufficient documentation

## 2010-11-20 DIAGNOSIS — S8990XA Unspecified injury of unspecified lower leg, initial encounter: Secondary | ICD-10-CM | POA: Insufficient documentation

## 2010-11-20 DIAGNOSIS — X500XXA Overexertion from strenuous movement or load, initial encounter: Secondary | ICD-10-CM | POA: Insufficient documentation

## 2010-11-20 DIAGNOSIS — M2390 Unspecified internal derangement of unspecified knee: Secondary | ICD-10-CM | POA: Insufficient documentation

## 2010-11-20 DIAGNOSIS — Y9302 Activity, running: Secondary | ICD-10-CM | POA: Insufficient documentation

## 2010-11-25 ENCOUNTER — Emergency Department (HOSPITAL_COMMUNITY)
Admission: EM | Admit: 2010-11-25 | Discharge: 2010-11-25 | Disposition: A | Discharge status: Home / Self Care | Payer: Medicaid Other | Attending: Emergency Medicine | Admitting: Emergency Medicine

## 2010-11-25 ENCOUNTER — Other Ambulatory Visit: Payer: Self-pay | Admitting: Family Medicine

## 2010-11-25 ENCOUNTER — Emergency Department (HOSPITAL_COMMUNITY): Payer: Medicaid Other

## 2010-11-25 DIAGNOSIS — R071 Chest pain on breathing: Secondary | ICD-10-CM | POA: Insufficient documentation

## 2010-11-25 DIAGNOSIS — M25562 Pain in left knee: Secondary | ICD-10-CM

## 2010-11-26 ENCOUNTER — Emergency Department (HOSPITAL_COMMUNITY)
Admission: EM | Admit: 2010-11-26 | Discharge: 2010-11-27 | Disposition: A | Discharge status: Home / Self Care | Payer: Medicaid Other | Attending: Emergency Medicine | Admitting: Emergency Medicine

## 2010-11-26 DIAGNOSIS — M79609 Pain in unspecified limb: Secondary | ICD-10-CM | POA: Insufficient documentation

## 2010-11-28 ENCOUNTER — Ambulatory Visit (HOSPITAL_COMMUNITY)
Admission: RE | Admit: 2010-11-28 | Discharge: 2010-11-28 | Disposition: A | Discharge status: Home / Self Care | Payer: Medicaid Other | Source: Ambulatory Visit | Attending: Family Medicine | Admitting: Family Medicine

## 2010-11-28 DIAGNOSIS — M25562 Pain in left knee: Secondary | ICD-10-CM

## 2010-11-28 DIAGNOSIS — M25569 Pain in unspecified knee: Secondary | ICD-10-CM | POA: Insufficient documentation

## 2010-11-28 DIAGNOSIS — M7120 Synovial cyst of popliteal space [Baker], unspecified knee: Secondary | ICD-10-CM | POA: Insufficient documentation

## 2010-12-28 ENCOUNTER — Emergency Department (HOSPITAL_COMMUNITY)
Admission: EM | Admit: 2010-12-28 | Discharge: 2010-12-28 | Disposition: A | Discharge status: Home / Self Care | Payer: Medicaid Other | Attending: Emergency Medicine | Admitting: Emergency Medicine

## 2010-12-28 ENCOUNTER — Encounter: Payer: Self-pay | Admitting: *Deleted

## 2010-12-28 DIAGNOSIS — R51 Headache: Secondary | ICD-10-CM | POA: Insufficient documentation

## 2010-12-28 MED ORDER — DEXAMETHASONE SODIUM PHOSPHATE 4 MG/ML IJ SOLN
10.0000 mg | Freq: Once | INTRAMUSCULAR | Status: AC
  Administered 2010-12-28: 10 mg via INTRAVENOUS
  Filled 2010-12-28: qty 3

## 2010-12-28 MED ORDER — SODIUM CHLORIDE 0.9 % IV BOLUS (SEPSIS)
1000.0000 mL | Freq: Once | INTRAVENOUS | Status: AC
  Administered 2010-12-28: 1000 mL via INTRAVENOUS
  Filled 2010-12-28: qty 1000

## 2010-12-28 MED ORDER — METOCLOPRAMIDE HCL 5 MG/ML IJ SOLN
10.0000 mg | Freq: Once | INTRAMUSCULAR | Status: AC
  Administered 2010-12-28: 10 mg via INTRAVENOUS
  Filled 2010-12-28: qty 2

## 2010-12-28 MED ORDER — KETOROLAC TROMETHAMINE 30 MG/ML IJ SOLN
30.0000 mg | Freq: Once | INTRAMUSCULAR | Status: AC
  Administered 2010-12-28: 30 mg via INTRAVENOUS
  Filled 2010-12-28: qty 1

## 2010-12-28 MED ORDER — DIPHENHYDRAMINE HCL 50 MG/ML IJ SOLN
25.0000 mg | Freq: Once | INTRAMUSCULAR | Status: AC
  Administered 2010-12-28: 25 mg via INTRAVENOUS
  Filled 2010-12-28: qty 1

## 2010-12-28 NOTE — ED Notes (Addendum)
Pt states pain from 10 to 9 at this time. EDP aware

## 2010-12-28 NOTE — ED Provider Notes (Signed)
History     Chief Complaint  Patient presents with  . Migraine    Pt c/o migraine x 2 days with nausea. No relief with ibuprofen.   Patient is a 25 y.o. female presenting with migraine. The history is provided by the patient. No language interpreter was used.  Migraine This is a new problem. The current episode started 2 days ago. Episode frequency: Initially intermittent 2 days ago and became constant yesterday. The problem has been gradually worsening. Pertinent negatives include no chest pain, no abdominal pain and no shortness of breath. Associated symptoms comments: Associated nausea. Denies blurred vision, photophobia, cough, congestion and rhinorrhea. . The symptoms are relieved by nothing (No relief from Ibuprofen). The treatment provided no relief.  Patient c/o gradual onset left sided HA 2 days ago and persistent since with associated nausea. Reports HA was intermittent initially and progressed to a constant HA yesterday. No relief from Ibuprofen.   History reviewed. No pertinent past medical history.  Past Surgical History  Procedure Date  . Cholecystectomy     Family History  Problem Relation Age of Onset  . Diabetes Mother   . Hypertension Father     History  Substance Use Topics  . Smoking status: Former Games developer  . Smokeless tobacco: Not on file  . Alcohol Use: Yes    OB History    Grav Para Term Preterm Abortions TAB SAB Ect Mult Living   2 2 2              Review of Systems  Constitutional: Negative for fever.  HENT: Negative for congestion and rhinorrhea.   Eyes: Negative for photophobia and visual disturbance.  Respiratory: Negative for cough and shortness of breath.   Cardiovascular: Negative for chest pain.  Gastrointestinal: Positive for nausea. Negative for vomiting and abdominal pain.  All other systems reviewed and are negative.    Physical Exam  BP 123/88  Pulse 85  Temp 97.5 F (36.4 C)  Resp 20  Ht 5\' 7"  (1.702 m)  Wt 235 lb (106.595  kg)  BMI 36.81 kg/m2  SpO2 99%  LMP 12/21/2010  Physical Exam  Nursing note and vitals reviewed. Constitutional: She is oriented to person, place, and time. Vital signs are normal. She appears well-developed and well-nourished.  HENT:  Head: Normocephalic and atraumatic.  Mouth/Throat: Uvula is midline, oropharynx is clear and moist and mucous membranes are normal.  Eyes: Conjunctivae and EOM are normal. Pupils are equal, round, and reactive to light. No scleral icterus.  Neck: Neck supple.  Cardiovascular: Normal rate, regular rhythm and normal heart sounds.   Pulmonary/Chest: Effort normal and breath sounds normal. She has no wheezes.  Abdominal: Soft. Bowel sounds are normal. She exhibits no distension. There is no tenderness.  Neurological: She is alert and oriented to person, place, and time. She has normal strength. No sensory deficit.       Grip strength normal and equal bilaterally.   Skin: Skin is warm and dry.  Psychiatric: She has a normal mood and affect.    ED Course  Procedures Recheck- 8:55AM, Patient resting comfortably upon entry into exam room. Patient reports HA has improved at this time. Informed of intent to d/c home. Patient agrees with plan set forth at this time.   MDM Headache without concern for life threatening intracranial process. Will give IV meds and reassess.   I personally performed the services described in the documentation, which were scribed in my presence. The recorded information has been reviewed  and considered.      Charles B. Bernette Mayers, MD 12/28/10 [phone]

## 2010-12-31 ENCOUNTER — Encounter: Payer: Self-pay | Admitting: Family Medicine

## 2010-12-31 DIAGNOSIS — G5600 Carpal tunnel syndrome, unspecified upper limb: Secondary | ICD-10-CM | POA: Insufficient documentation

## 2010-12-31 DIAGNOSIS — E669 Obesity, unspecified: Secondary | ICD-10-CM | POA: Insufficient documentation

## 2011-01-31 ENCOUNTER — Emergency Department (HOSPITAL_COMMUNITY)
Admission: EM | Admit: 2011-01-31 | Discharge: 2011-01-31 | Disposition: A | Discharge status: Home / Self Care | Payer: Medicaid Other | Attending: Emergency Medicine | Admitting: Emergency Medicine

## 2011-01-31 ENCOUNTER — Encounter (HOSPITAL_COMMUNITY): Payer: Self-pay | Admitting: Emergency Medicine

## 2011-01-31 DIAGNOSIS — K08409 Partial loss of teeth, unspecified cause, unspecified class: Secondary | ICD-10-CM

## 2011-01-31 DIAGNOSIS — K089 Disorder of teeth and supporting structures, unspecified: Secondary | ICD-10-CM | POA: Insufficient documentation

## 2011-01-31 MED ORDER — OXYCODONE-ACETAMINOPHEN 5-325 MG PO TABS: 1.0000 | ORAL_TABLET | ORAL | Status: AC | PRN

## 2011-01-31 MED ORDER — IBUPROFEN 800 MG PO TABS: 800.0000 mg | ORAL_TABLET | Freq: Three times a day (TID) | ORAL | Status: AC

## 2011-01-31 NOTE — ED Provider Notes (Signed)
History     CSN: [id number] Arrival date & time: 01/31/2011  1:17 AM  Chief Complaint  Patient presents with  . Dental Pain   Patient is a 25 y.o. female presenting with tooth pain. The history is provided by the patient.  Dental PainThe primary symptoms include mouth pain. Primary symptoms do not include fever or shortness of breath. Primary symptoms comment: She had a dental extraction 10 days ago and continues to have pain and swelling at the extraction site.   She denies fever. The symptoms began 5 to 7 days ago. The symptoms are unchanged. The symptoms occur constantly.  Additional symptoms include: gum swelling, gum tenderness, jaw pain and ear pain. Additional symptoms do not include: purulent gums, facial swelling, pain with swallowing and dry mouth.    Past Medical History  Diagnosis Date  . Obesity   . Carpal tunnel syndrome     Past Surgical History  Procedure Date  . Cholecystectomy   . Cesarean section     Family History  Problem Relation Age of Onset  . Diabetes Mother   . Hypertension Father     History  Substance Use Topics  . Smoking status: Former Games developer  . Smokeless tobacco: Not on file  . Alcohol Use: No    OB History    Grav Para Term Preterm Abortions TAB SAB Ect Mult Living   2 2 2              Review of Systems  Constitutional: Negative for fever.  HENT: Positive for ear pain. Negative for facial swelling.   Respiratory: Negative for chest tightness and shortness of breath.   Cardiovascular: Negative for chest pain.  Gastrointestinal: Negative for abdominal pain.  Musculoskeletal: Negative for arthralgias.    Physical Exam  BP 126/72  Pulse 56  Temp(Src) 98.2 F (36.8 C) (Oral)  Resp 18  Ht 5\' 7"  (1.702 m)  Wt 235 lb (106.595 kg)  BMI 36.81 kg/m2  SpO2 100%  LMP 01/21/2011  Physical Exam  Constitutional: She is oriented to person, place, and time. She appears well-developed and well-nourished. No distress.  HENT:  Head:  Normocephalic and atraumatic. No trismus in the jaw.  Right Ear: Tympanic membrane and external ear normal.  Left Ear: Tympanic membrane and external ear normal.  Mouth/Throat: Oropharynx is clear and moist and mucous membranes are normal. No oral lesions. No dental abscesses or uvula swelling. No posterior oropharyngeal erythema.    Eyes: Conjunctivae are normal.  Neck: Normal range of motion. Neck supple.  Cardiovascular: Normal rate and normal heart sounds.   Pulmonary/Chest: Effort normal.  Abdominal: She exhibits no distension.  Musculoskeletal: Normal range of motion.  Lymphadenopathy:       Head (right side): No submental, no submandibular, no tonsillar, no preauricular, no posterior auricular and no occipital adenopathy present.       Head (left side): No submental, no submandibular, no tonsillar, no preauricular, no posterior auricular and no occipital adenopathy present.    She has no cervical adenopathy.  Neurological: She is alert and oriented to person, place, and time.  Skin: Skin is warm and dry. No erythema.  Psychiatric: She has a normal mood and affect.    ED Course  Procedures  MDM Patient has just comleted 10 day course of amoxil yesterday.  No sign of infection on todays exam.  Also ran out of her oxycodone ytd.        Candis Musa, PA 01/31/11 1217 Medical screening examination/treatment/procedure(s)  were performed by non-physician practitioner and as supervising physician I was immediately available for consultation/collaboration.  Nicoletta Dress. Colon Branch, MD 02/13/11 [phone]

## 2011-01-31 NOTE — ED Notes (Signed)
Pt has completed antibiotics & out of pain meds. States her procedure site hurts worse today.

## 2011-01-31 NOTE — ED Notes (Signed)
Patient had right lower tooth surgically removed last Thursday; c/o increased pain.

## 2011-02-10 ENCOUNTER — Other Ambulatory Visit: Payer: Self-pay

## 2011-02-10 ENCOUNTER — Encounter (HOSPITAL_COMMUNITY): Payer: Self-pay

## 2011-02-10 ENCOUNTER — Emergency Department (HOSPITAL_COMMUNITY): Payer: Medicaid Other

## 2011-02-10 ENCOUNTER — Emergency Department (HOSPITAL_COMMUNITY)
Admission: EM | Admit: 2011-02-10 | Discharge: 2011-02-10 | Disposition: A | Discharge status: Home / Self Care | Payer: Medicaid Other | Attending: Emergency Medicine | Admitting: Emergency Medicine

## 2011-02-10 DIAGNOSIS — Y92410 Unspecified street and highway as the place of occurrence of the external cause: Secondary | ICD-10-CM | POA: Insufficient documentation

## 2011-02-10 DIAGNOSIS — M545 Low back pain, unspecified: Secondary | ICD-10-CM | POA: Insufficient documentation

## 2011-02-10 DIAGNOSIS — Z87891 Personal history of nicotine dependence: Secondary | ICD-10-CM | POA: Insufficient documentation

## 2011-02-10 DIAGNOSIS — R001 Bradycardia, unspecified: Secondary | ICD-10-CM

## 2011-02-10 DIAGNOSIS — R079 Chest pain, unspecified: Secondary | ICD-10-CM | POA: Insufficient documentation

## 2011-02-10 DIAGNOSIS — I498 Other specified cardiac arrhythmias: Secondary | ICD-10-CM | POA: Insufficient documentation

## 2011-02-10 MED ORDER — OXYCODONE-ACETAMINOPHEN 5-325 MG PO TABS
1.0000 | ORAL_TABLET | Freq: Once | ORAL | Status: AC
  Administered 2011-02-10: 1 via ORAL
  Filled 2011-02-10: qty 1
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
## 2011-03-16 LAB — CBC
HCT: 40.3
MCV: 79.5
RBC: 5.07
WBC: 9.6

## 2011-03-16 LAB — DIFFERENTIAL
Eosinophils Absolute: 0.2
Eosinophils Relative: 2
Lymphs Abs: 2.2
Monocytes Relative: 5
Neutrophils Relative %: 70

## 2011-03-16 LAB — I-STAT 8, (EC8 V) (CONVERTED LAB)
BUN: 6
Chloride: 107
HCT: 42
Operator id: 151321
Potassium: 4.2
pCO2, Ven: 42.2 — ABNORMAL LOW

## 2011-03-18 LAB — COMPREHENSIVE METABOLIC PANEL
ALT: 19
AST: 24
Albumin: 3.7
Alkaline Phosphatase: 45
Chloride: 107
GFR calc Af Amer: >60
Potassium: 3.7
Sodium: 139
Total Bilirubin: 1

## 2011-03-18 LAB — URINALYSIS, ROUTINE W REFLEX MICROSCOPIC
Bilirubin Urine: NEGATIVE
Glucose, UA: NEGATIVE
Hgb urine dipstick: NEGATIVE
Nitrite: NEGATIVE
Protein, ur: NEGATIVE
Specific Gravity, Urine: 1.014
Urobilinogen, UA: 1
Urobilinogen, UA: 1

## 2011-03-18 LAB — PREGNANCY, URINE: Preg Test, Ur: NEGATIVE

## 2011-03-18 LAB — URINE MICROSCOPIC-ADD ON

## 2011-03-18 LAB — DIFFERENTIAL
Basophils Absolute: 0
Basophils Relative: 0
Eosinophils Relative: 2
Lymphocytes Relative: 26
Monocytes Absolute: 0.5

## 2011-03-18 LAB — CBC
Platelets: 271
WBC: 8.2

## 2011-03-26 ENCOUNTER — Encounter (HOSPITAL_COMMUNITY): Payer: Self-pay | Admitting: Emergency Medicine

## 2011-03-26 ENCOUNTER — Emergency Department (HOSPITAL_COMMUNITY)
Admission: EM | Admit: 2011-03-26 | Discharge: 2011-03-26 | Disposition: A | Discharge status: Home / Self Care | Payer: Medicaid Other | Attending: Emergency Medicine | Admitting: Emergency Medicine

## 2011-03-26 DIAGNOSIS — W2209XA Striking against other stationary object, initial encounter: Secondary | ICD-10-CM | POA: Insufficient documentation

## 2011-03-26 DIAGNOSIS — S90129A Contusion of unspecified lesser toe(s) without damage to nail, initial encounter: Secondary | ICD-10-CM

## 2011-03-26 DIAGNOSIS — M79609 Pain in unspecified limb: Secondary | ICD-10-CM | POA: Insufficient documentation

## 2011-03-26 MED ORDER — OXYCODONE-ACETAMINOPHEN 5-325 MG PO TABS: 2.0000 | ORAL_TABLET | ORAL | Status: AC | PRN

## 2011-03-26 MED ORDER — IBUPROFEN 800 MG PO TABS
800.0000 mg | ORAL_TABLET | Freq: Once | ORAL | Status: AC
  Administered 2011-03-26: 800 mg via ORAL
  Filled 2011-03-26: qty 1

## 2011-03-26 NOTE — ED Provider Notes (Signed)
History   Scribed for Dr. Bebe Shaggy, the patient was seen in room APA19/APA19. This chart was scribed by Stacy [name]. This patient's care was started at 9:18PM.  CSN: [id number] Arrival date & time: 03/26/2011  9:09 AM  Chief Complaint  Patient presents with  . Toe Pain   HPI Stacy [name] is a 25 y.o. female who presents to the Emergency Department complaining of constant moderate right great toe pain onset last night after striking toe against concrete while chasing her child. Denies neck pain, back pain, numbness and tingling. Patient reports toe pain is aggravated with walking and relieved by nothing.    HPI ELEMENTS: Location: right greater toe  Onset: last night Duration: persistent since onset  Timing: constant   Severity: moderate  Modifying factors: aggravated with walking and relieved by nothing.  Context:  as above  Associated symptoms: Denies neck pain, back pain, numbness and tingling  PAST MEDICAL HISTORY:  Past Medical History  Diagnosis Date  . Obesity   . Carpal tunnel syndrome     PAST SURGICAL HISTORY:  Past Surgical History  Procedure Date  . Cholecystectomy   . Cesarean section   . Tubal ligation     FAMILY HISTORY:  Family History  Problem Relation Age of Onset  . Diabetes Mother   . Hypertension Father      SOCIAL HISTORY: History   Social History  . Marital Status: Single    Spouse Name: N/A    Number of Children: N/A  . Years of Education: N/A   Social History Main Topics  . Smoking status: Former Games developer  . Smokeless tobacco: None  . Alcohol Use: No  . Drug Use: No  . Sexually Active: Not Currently    Birth Control/ Protection: Surgical   Other Topics Concern  . None   Social History Narrative  . None    OB History    Grav Para Term Preterm Abortions TAB SAB Ect Mult Living   2 2 2              Review of Systems  Musculoskeletal: Positive for joint swelling.  Neurological: Negative for weakness.    Allergies    Vicodin  Home Medications   Current Outpatient Rx  Name Route Sig Dispense Refill  . IBUPROFEN 200 MG PO TABS Oral Take 600 mg by mouth every 6 (six) hours as needed. Pain     . OXYCODONE-ACETAMINOPHEN 10-325 MG PO TABS Oral Take 1 tablet by mouth every 4 (four) hours as needed.        BP 128/80  Pulse 80  Temp(Src) 97.6 F (36.4 C) (Oral)  Resp 19  SpO2 100%  LMP 03/24/2011  Physical Exam CONSTITUTIONAL: Well developed/well nourished HEAD AND FACE: Normocephalic/atraumatic EYES: EOMI/PERRL ENMT: Mucous membranes moist NECK: supple no meningeal signs LUNGS: no apparent distress NEURO: Pt is awake/alert, moves all extremitiesx4 EXTREMITIES: pulses normal, full ROM, tender at joint of right great toe no deformity, no open skin noted SKIN: warm, color normal PSYCH: no abnormalities of mood noted  ED Course  Procedures MDM   OTHER DATA REVIEWED: Nursing notes, vital signs, and past medical records reviewed.  DIAGNOSTIC STUDIES: Oxygen Saturation is 100% on room air, normal by my interpretation.     MDM:  pt did not want xray, she would like buddy tape/postop shoe and to go to work She has no other bony tenderness to foot/ankle   I personally performed the services described in this documentation, which was scribed  in my presence. The recorded information has been reviewed and considered.         Joya Gaskins, MD 03/26/11 1018

## 2011-03-26 NOTE — ED Notes (Signed)
Pt stumped r great toe last night. States it was bleeding. Nad. Slight limp with ambulation.

## 2011-04-08 LAB — I-STAT 8, (EC8 V) (CONVERTED LAB)
Glucose, Bld: 81
HCT: 34 — ABNORMAL LOW
Hemoglobin: 11.6 — ABNORMAL LOW
Potassium: 3.8
Sodium: 140
TCO2: 28

## 2011-04-08 LAB — POCT I-STAT CREATININE
Creatinine, Ser: 0.6
Operator id: 285841

## 2011-04-09 LAB — BASIC METABOLIC PANEL
CO2: 25
Chloride: 109
Creatinine, Ser: 0.64
GFR calc Af Amer: >60
Potassium: 3.5
Sodium: 143

## 2011-04-09 LAB — DIFFERENTIAL
Basophils Absolute: 0.1
Basophils Relative: 1
Eosinophils Absolute: 0.1
Eosinophils Relative: 2
Eosinophils Relative: 1
Lymphocytes Relative: 22
Lymphs Abs: 2.5
Monocytes Absolute: 0.3
Monocytes Relative: 4
Monocytes Relative: 4
Neutrophils Relative %: 73

## 2011-04-09 LAB — POCT CARDIAC MARKERS: Troponin i, poc: <0.05

## 2011-04-09 LAB — URINE CULTURE
Colony Count: NO GROWTH
Culture: NO GROWTH

## 2011-04-09 LAB — URINALYSIS, ROUTINE W REFLEX MICROSCOPIC
Bilirubin Urine: NEGATIVE
Ketones, ur: NEGATIVE
Nitrite: NEGATIVE
Urobilinogen, UA: 1
pH: 8

## 2011-04-09 LAB — POCT I-STAT CREATININE
Creatinine, Ser: 1
Operator id: 146091

## 2011-04-09 LAB — COMPREHENSIVE METABOLIC PANEL
AST: 31
Albumin: 3.9
Alkaline Phosphatase: 48
Chloride: 105
GFR calc Af Amer: >60
Potassium: 4.6
Sodium: 136
Total Bilirubin: 1.9 — ABNORMAL HIGH

## 2011-04-09 LAB — I-STAT 8, (EC8 V) (CONVERTED LAB)
Acid-Base Excess: 8 — ABNORMAL HIGH
Bicarbonate: 34 — ABNORMAL HIGH
Chloride: 100
HCT: 43
Operator id: 146091
TCO2: 36
pCO2, Ven: 53.2 — ABNORMAL HIGH
pH, Ven: 7.413 — ABNORMAL HIGH

## 2011-04-09 LAB — CBC
Hemoglobin: 13.4
MCHC: 34.7
MCV: 76.3 — ABNORMAL LOW
Platelets: 261
RBC: 5.05
WBC: 7.9
WBC: 11.4 — ABNORMAL HIGH

## 2011-04-09 LAB — URINE MICROSCOPIC-ADD ON

## 2011-04-09 LAB — POCT PREGNANCY, URINE: Operator id: 146091

## 2011-04-09 LAB — CK: Total CK: 123

## 2011-05-24 ENCOUNTER — Emergency Department (HOSPITAL_COMMUNITY): Payer: Medicaid Other

## 2011-05-24 ENCOUNTER — Encounter (HOSPITAL_COMMUNITY): Payer: Self-pay | Admitting: *Deleted

## 2011-05-24 ENCOUNTER — Emergency Department (HOSPITAL_COMMUNITY)
Admission: EM | Admit: 2011-05-24 | Discharge: 2011-05-24 | Disposition: A | Discharge status: Home / Self Care | Payer: Medicaid Other | Attending: Emergency Medicine | Admitting: Emergency Medicine

## 2011-05-24 DIAGNOSIS — S8000XA Contusion of unspecified knee, initial encounter: Secondary | ICD-10-CM | POA: Insufficient documentation

## 2011-05-24 DIAGNOSIS — M545 Low back pain, unspecified: Secondary | ICD-10-CM | POA: Insufficient documentation

## 2011-05-24 DIAGNOSIS — S8001XA Contusion of right knee, initial encounter: Secondary | ICD-10-CM

## 2011-05-24 DIAGNOSIS — S335XXA Sprain of ligaments of lumbar spine, initial encounter: Secondary | ICD-10-CM | POA: Insufficient documentation

## 2011-05-24 DIAGNOSIS — T148XXA Other injury of unspecified body region, initial encounter: Secondary | ICD-10-CM

## 2011-05-24 DIAGNOSIS — S39012A Strain of muscle, fascia and tendon of lower back, initial encounter: Secondary | ICD-10-CM

## 2011-05-24 DIAGNOSIS — M25569 Pain in unspecified knee: Secondary | ICD-10-CM | POA: Insufficient documentation

## 2011-05-24 DIAGNOSIS — Y92410 Unspecified street and highway as the place of occurrence of the external cause: Secondary | ICD-10-CM | POA: Insufficient documentation

## 2011-05-24 MED ORDER — IBUPROFEN 400 MG PO TABS: 400.0000 mg | ORAL_TABLET | Freq: Three times a day (TID) | ORAL | Status: DC | PRN

## 2011-05-24 MED ORDER — KETOROLAC TROMETHAMINE 30 MG/ML IJ SOLN
30.0000 mg | Freq: Once | INTRAMUSCULAR | Status: AC
  Administered 2011-05-24: 30 mg via INTRAMUSCULAR
  Filled 2011-05-24: qty 1

## 2011-05-24 MED ORDER — DIAZEPAM 5 MG PO TABS
5.0000 mg | ORAL_TABLET | Freq: Once | ORAL | Status: DC
  Filled 2011-05-24: qty 1

## 2011-05-24 MED ORDER — CYCLOBENZAPRINE HCL 10 MG PO TABS: 10.0000 mg | ORAL_TABLET | Freq: Three times a day (TID) | ORAL | Status: AC | PRN

## 2011-05-24 MED ORDER — DIAZEPAM 5 MG PO TABS
10.0000 mg | ORAL_TABLET | Freq: Once | ORAL | Status: AC
  Administered 2011-05-24: 10 mg via ORAL
  Filled 2011-05-24: qty 1
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
## 2011-05-27 ENCOUNTER — Encounter (HOSPITAL_COMMUNITY): Payer: Self-pay | Admitting: Emergency Medicine

## 2011-05-27 ENCOUNTER — Emergency Department (HOSPITAL_COMMUNITY)
Admission: EM | Admit: 2011-05-27 | Discharge: 2011-05-27 | Disposition: A | Discharge status: Home / Self Care | Payer: Medicaid Other | Attending: Emergency Medicine | Admitting: Emergency Medicine

## 2011-05-27 DIAGNOSIS — S39012A Strain of muscle, fascia and tendon of lower back, initial encounter: Secondary | ICD-10-CM

## 2011-05-27 DIAGNOSIS — M545 Low back pain, unspecified: Secondary | ICD-10-CM | POA: Insufficient documentation

## 2011-05-27 DIAGNOSIS — Z87891 Personal history of nicotine dependence: Secondary | ICD-10-CM | POA: Insufficient documentation

## 2011-05-27 DIAGNOSIS — G5600 Carpal tunnel syndrome, unspecified upper limb: Secondary | ICD-10-CM | POA: Insufficient documentation

## 2011-05-27 DIAGNOSIS — Z6836 Body mass index (BMI) 36.0-36.9, adult: Secondary | ICD-10-CM | POA: Insufficient documentation

## 2011-05-27 DIAGNOSIS — E669 Obesity, unspecified: Secondary | ICD-10-CM | POA: Insufficient documentation

## 2011-05-27 MED ORDER — DEXAMETHASONE 4 MG PO TABS: ORAL_TABLET | ORAL | Status: AC

## 2011-05-27 MED ORDER — DEXAMETHASONE SODIUM PHOSPHATE 4 MG/ML IJ SOLN
8.0000 mg | Freq: Once | INTRAMUSCULAR | Status: AC
  Administered 2011-05-27: 8 mg via INTRAMUSCULAR
  Filled 2011-05-27: qty 2

## 2011-05-27 NOTE — ED Provider Notes (Signed)
History     CSN: [id number] Arrival date & time: 05/27/2011  8:00 AM   First MD Initiated Contact with Patient 05/27/11 [phone]      Chief Complaint  Patient presents with  . Optician, dispensing  . Back Pain    (Consider location/radiation/quality/duration/timing/severity/associated sxs/prior treatment) Patient is a 25 y.o. female presenting with back pain. The history is provided by the patient.  Back Pain     Past Medical History  Diagnosis Date  . Obesity   . Carpal tunnel syndrome     Past Surgical History  Procedure Date  . Cholecystectomy   . Cesarean section   . Tubal ligation     Family History  Problem Relation Age of Onset  . Diabetes Mother   . Hypertension Father     History  Substance Use Topics  . Smoking status: Former Games developer  . Smokeless tobacco: Not on file  . Alcohol Use: No    OB History    Grav Para Term Preterm Abortions TAB SAB Ect Mult Living   2 2 2              Review of Systems  Musculoskeletal: Positive for back pain.    Allergies  Vicodin  Home Medications   Current Outpatient Rx  Name Route Sig Dispense Refill  . CYCLOBENZAPRINE HCL 10 MG PO TABS Oral Take 1 tablet (10 mg total) by mouth 3 (three) times daily as needed for muscle spasms. 15 tablet 0  . IBUPROFEN 200 MG PO TABS Oral Take 600 mg by mouth every 6 (six) hours as needed. Pain     . IBUPROFEN 400 MG PO TABS Oral Take 1 tablet (400 mg total) by mouth every 8 (eight) hours as needed for pain (3 times a day for 3 days then PRN only). 15 tablet 0  . OXYCODONE-ACETAMINOPHEN 10-325 MG PO TABS Oral Take 1 tablet by mouth every 4 (four) hours as needed.        BP 120/66  Pulse 61  Temp(Src) 98 F (36.7 C) (Oral)  Resp 18  Ht 5\' 7"  (1.702 m)  Wt 230 lb (104.327 kg)  BMI 36.02 kg/m2  SpO2 96%  LMP 05/22/2011  Physical Exam  Nursing note and vitals reviewed. Constitutional: She is oriented to person, place, and time. She appears well-developed and  well-nourished.  Non-toxic appearance.  HENT:  Head: Normocephalic.  Right Ear: Tympanic membrane and external ear normal.  Left Ear: Tympanic membrane and external ear normal.  Eyes: EOM and lids are normal. Pupils are equal, round, and reactive to light.  Neck: Normal range of motion. Neck supple. Carotid bruit is not present.  Cardiovascular: Normal rate, regular rhythm, normal heart sounds, intact distal pulses and normal pulses.   Pulmonary/Chest: Breath sounds normal. No respiratory distress.  Abdominal: Soft. Bowel sounds are normal. There is no tenderness. There is no guarding.  Musculoskeletal: Normal range of motion.       Pain of the lumbar area with attempted ROM.  Lymphadenopathy:       Head (right side): No submandibular adenopathy present.       Head (left side): No submandibular adenopathy present.    She has no cervical adenopathy.  Neurological: She is alert and oriented to person, place, and time. She has normal strength. No cranial nerve deficit or sensory deficit. She exhibits normal muscle tone.       Gait wnl  Skin: Skin is warm and dry.  Psychiatric: She has a normal  mood and affect. Her speech is normal.    ED Course  Procedures (including critical care time)  Labs Reviewed - No data to display No results found.   No diagnosis found.    MDM  Previous ED report and xrays reviewed. Plan to add Decadron to current medications. Pt to see Orthopedics for evaluation if not improving.        Kathie Dike, [place] 05/27/11 1719
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
## 2011-05-29 NOTE — ED Provider Notes (Signed)
Medical screening examination/treatment/procedure(s) were performed by non-physician practitioner and as supervising physician I was immediately available for consultation/collaboration.  Nicoletta Dress. Colon Branch, MD 05/29/11 1050

## 2011-05-29 NOTE — ED Provider Notes (Signed)
Medical screening examination/treatment/procedure(s) were performed by non-physician practitioner and as supervising physician I was immediately available for consultation/collaboration.   Gerhard Munch, MD 05/29/11 [phone]

## 2011-06-18 ENCOUNTER — Inpatient Hospital Stay (HOSPITAL_COMMUNITY): Admission: RE | Admit: 2011-06-18 | Payer: Medicaid Other | Source: Ambulatory Visit | Admitting: Physical Therapy

## 2011-08-24 ENCOUNTER — Emergency Department (HOSPITAL_COMMUNITY)
Admission: EM | Admit: 2011-08-24 | Discharge: 2011-08-24 | Disposition: A | Discharge status: Home / Self Care | Payer: Medicaid Other | Attending: Emergency Medicine | Admitting: Emergency Medicine

## 2011-08-24 ENCOUNTER — Encounter (HOSPITAL_COMMUNITY): Payer: Self-pay | Admitting: *Deleted

## 2011-08-24 DIAGNOSIS — Z87891 Personal history of nicotine dependence: Secondary | ICD-10-CM | POA: Insufficient documentation

## 2011-08-24 DIAGNOSIS — G43909 Migraine, unspecified, not intractable, without status migrainosus: Secondary | ICD-10-CM | POA: Insufficient documentation

## 2011-08-24 MED ORDER — DEXAMETHASONE SODIUM PHOSPHATE 10 MG/ML IJ SOLN
10.0000 mg | Freq: Once | INTRAMUSCULAR | Status: AC
  Administered 2011-08-24: 10 mg via INTRAVENOUS
  Filled 2011-08-24: qty 1

## 2011-08-24 MED ORDER — METOCLOPRAMIDE HCL 5 MG/ML IJ SOLN
10.0000 mg | Freq: Once | INTRAMUSCULAR | Status: AC
  Administered 2011-08-24: 10 mg via INTRAVENOUS
  Filled 2011-08-24: qty 2

## 2011-08-24 MED ORDER — DIPHENHYDRAMINE HCL 50 MG/ML IJ SOLN
25.0000 mg | Freq: Once | INTRAMUSCULAR | Status: AC
  Administered 2011-08-24: 25 mg via INTRAVENOUS
  Filled 2011-08-24: qty 1

## 2011-08-24 MED ORDER — SODIUM CHLORIDE 0.9 % IV SOLN
INTRAVENOUS | Status: DC
  Administered 2011-08-24: 1000 mL via INTRAVENOUS

## 2011-08-24 NOTE — ED Provider Notes (Signed)
History     CSN: [id number]  Arrival date & time 08/24/11  0805   First MD Initiated Contact with Patient 08/24/11 [phone]      Chief Complaint  Patient presents with  . Migraine  . Nausea    (Consider location/radiation/quality/duration/timing/severity/associated sxs/prior treatment) Patient is a 26 y.o. female presenting with migraine. The history is provided by the patient.  Migraine This is a new problem. The current episode started in the past 7 days. The problem occurs constantly. The problem has been unchanged. Associated symptoms include headaches and nausea. Pertinent negatives include no chills, fever, rash or vomiting. Associated symptoms comments: Patient reports symptoms typical of her recurrent migraine headaches.. Exacerbated by: Photophobia. She has tried NSAIDs for the symptoms. The treatment provided no relief.    Past Medical History  Diagnosis Date  . Obesity   . Carpal tunnel syndrome   . Migraine     Past Surgical History  Procedure Date  . Cholecystectomy   . Cesarean section   . Tubal ligation   . Wisdom tooth extraction     Family History  Problem Relation Age of Onset  . Diabetes Mother   . Hypertension Father     History  Substance Use Topics  . Smoking status: Former Games developer  . Smokeless tobacco: Not on file  . Alcohol Use: No    OB History    Grav Para Term Preterm Abortions TAB SAB Ect Mult Living   2 2 2              Review of Systems  Constitutional: Negative for fever and chills.  Eyes: Positive for photophobia.  Respiratory: Negative.   Cardiovascular: Negative.   Gastrointestinal: Positive for nausea. Negative for vomiting.  Musculoskeletal: Negative.   Skin: Negative.  Negative for rash.  Neurological: Positive for headaches.    Allergies  Vicodin  Home Medications   Current Outpatient Rx  Name Route Sig Dispense Refill  . IBUPROFEN 200 MG PO TABS Oral Take 800 mg by mouth every 6 (six) hours as needed. Pain       BP 137/92  Pulse 70  Temp(Src) 96.9 F (36.1 C) (Oral)  Resp 18  Ht 5\' 7"  (1.702 m)  Wt 238 lb (107.956 kg)  BMI 37.28 kg/m2  SpO2 98%  Physical Exam  Constitutional: She is oriented to person, place, and time. She appears well-developed and well-nourished.  HENT:  Head: Normocephalic.  Eyes: Pupils are equal, round, and reactive to light.  Neck: Normal range of motion. Neck supple.  Cardiovascular: Normal rate and regular rhythm.   Pulmonary/Chest: Effort normal and breath sounds normal.  Abdominal: Soft. Bowel sounds are normal. There is no tenderness. There is no rebound and no guarding.  Musculoskeletal: Normal range of motion.  Neurological: She is alert and oriented to person, place, and time. She has normal strength and normal reflexes. No cranial nerve deficit or sensory deficit. She displays a negative Romberg sign. Coordination normal.  Skin: Skin is warm and dry. No rash noted.  Psychiatric: She has a normal mood and affect.    ED Course  Procedures (including critical care time)  Labs Reviewed - No data to display No results found.   No diagnosis found.    MDM          Rodena Medin, PA-C 08/24/11 [phone]

## 2011-08-24 NOTE — ED Notes (Signed)
Patient has hx of migraines,  She reports onset of headache on Friday night.  She reports nausea and photosensativity.  Patient has taken ibuprofen w/o relief.  Last dose was today at 300am

## 2011-08-24 NOTE — Discharge Instructions (Signed)
FOLLOW UP WITH [facility] FOR FURTHER MANAGEMENT OF MIGRAINE HEADACHES.  Migraine Headache A migraine is very bad pain on one or both sides of your head. The cause of a migraine is not always known. A migraine can be triggered or caused by different things, such as:  Alcohol.   Smoking.   Stress.   Periods (menstruation) in women.   Aged cheeses.   Foods or drinks that contain nitrates, glutamate, aspartame, or tyramine.   Lack of sleep.   Chocolate.   Caffeine.   Hunger.   Medicines, such as nitroglycerine (used to treat chest pain), birth control pills, estrogen, and some blood pressure medicines.  HOME CARE  Many medicines can help migraine pain or keep migraines from coming back. Your doctor can help you decide on a medicine or treatment program.   If you or your child gets a migraine, it may help to lie down in a dark, quiet room.   Keep a headache journal. This may help find out what is causing the headaches. For example, write down:   What you eat and drink.   How much sleep you get.   Any change to your diet or medicines.  GET HELP RIGHT AWAY IF:   The medicine does not work.   The pain begins again.   The neck is stiff.   You have trouble seeing.   The muscles are weak or you lose muscle control.   You have new symptoms.   You lose your balance.   You have trouble walking.   You feel faint or pass out.  MAKE SURE YOU:   Understand these instructions.   Will watch this condition.   Will get help right away if you are not doing well or get worse.  Document Released: 03/17/2008 Document Revised: 05/28/2011 Document Reviewed: 02/11/2009 [employer] Patient Information 2012 [place], [place].

## 2011-08-24 NOTE — ED Provider Notes (Signed)
Medical screening examination/treatment/procedure(s) were performed by non-physician practitioner and as supervising physician I was immediately available for consultation/collaboration.  Kerri Kovacik, MD 08/24/11 1709 

## 2011-08-24 NOTE — ED Notes (Signed)
Patient here with complaints of headache, sts hx of migraines but no rx meds because occurs very infrequently. Has attempted relief with ibuprofen with no success. Pt sts nauseated, and worse with light.

## 2011-08-24 NOTE — ED Notes (Signed)
Patient states pain improved to 6/10 achy states medication helped feels much better. Ambulate steady gait.

## 2011-08-24 NOTE — ED Notes (Signed)
Patient is resting comfortably. 

## 2011-08-24 NOTE — ED Notes (Signed)
Family at bedside. 

## 2011-09-14 ENCOUNTER — Emergency Department (HOSPITAL_COMMUNITY)
Admission: EM | Admit: 2011-09-14 | Discharge: 2011-09-14 | Disposition: A | Discharge status: Home / Self Care | Payer: Medicaid Other | Attending: Emergency Medicine | Admitting: Emergency Medicine

## 2011-09-14 ENCOUNTER — Other Ambulatory Visit: Payer: Self-pay

## 2011-09-14 ENCOUNTER — Encounter (HOSPITAL_COMMUNITY): Payer: Self-pay | Admitting: *Deleted

## 2011-09-14 ENCOUNTER — Emergency Department (HOSPITAL_COMMUNITY): Payer: Medicaid Other

## 2011-09-14 DIAGNOSIS — R0602 Shortness of breath: Secondary | ICD-10-CM | POA: Insufficient documentation

## 2011-09-14 DIAGNOSIS — E669 Obesity, unspecified: Secondary | ICD-10-CM | POA: Insufficient documentation

## 2011-09-14 DIAGNOSIS — R0789 Other chest pain: Secondary | ICD-10-CM | POA: Insufficient documentation

## 2011-09-14 DIAGNOSIS — R071 Chest pain on breathing: Secondary | ICD-10-CM | POA: Insufficient documentation

## 2011-09-14 LAB — BASIC METABOLIC PANEL
BUN: 14 mg/dL (ref 6–23)
CO2: 25 mEq/L (ref 19–32)
Chloride: 107 mEq/L (ref 96–112)
GFR calc non Af Amer: >90 mL/min (ref >90)
Glucose, Bld: 98 mg/dL (ref 70–99)
Potassium: 4.1 mEq/L (ref 3.5–5.1)
Sodium: 139 mEq/L (ref 135–145)

## 2011-09-14 LAB — URINALYSIS, ROUTINE W REFLEX MICROSCOPIC
Glucose, UA: NEGATIVE mg/dL
Leukocytes, UA: NEGATIVE
Protein, ur: NEGATIVE mg/dL
Specific Gravity, Urine: >1.03 — ABNORMAL HIGH (ref 1.005–1.030)
pH: 5.5 (ref 5.0–8.0)

## 2011-09-14 LAB — CBC
Hemoglobin: 12.5 g/dL (ref 12.0–15.0)
MCH: 26.8 pg (ref 26.0–34.0)
Platelets: 238 10*3/uL (ref 150–400)
RBC: 4.67 MIL/uL (ref 3.87–5.11)
WBC: 6.4 10*3/uL (ref 4.0–10.5)

## 2011-09-14 LAB — URINE MICROSCOPIC-ADD ON

## 2011-09-14 LAB — DIFFERENTIAL
Eosinophils Absolute: 0.2 10*3/uL (ref 0.0–0.7)
Lymphocytes Relative: 29 % (ref 12–46)
Lymphs Abs: 1.9 10*3/uL (ref 0.7–4.0)
Monocytes Relative: 6 % (ref 3–12)
Neutrophils Relative %: 62 % (ref 43–77)

## 2011-09-14 LAB — POCT PREGNANCY, URINE: Preg Test, Ur: NEGATIVE

## 2011-09-14 MED ORDER — NAPROXEN 250 MG PO TABS: 250.0000 mg | ORAL_TABLET | Freq: Two times a day (BID) | ORAL | Status: DC

## 2011-09-14 MED ORDER — KETOROLAC TROMETHAMINE 60 MG/2ML IM SOLN
60.0000 mg | Freq: Once | INTRAMUSCULAR | Status: AC
  Administered 2011-09-14: 60 mg via INTRAMUSCULAR
  Filled 2011-09-14: qty 2

## 2011-09-14 MED ORDER — OXYCODONE-ACETAMINOPHEN 5-325 MG PO TABS
1.0000 | ORAL_TABLET | Freq: Once | ORAL | Status: AC
  Administered 2011-09-14: 1 via ORAL
  Filled 2011-09-14: qty 1

## 2011-09-14 MED ORDER — CYCLOBENZAPRINE HCL 10 MG PO TABS: 10.0000 mg | ORAL_TABLET | Freq: Three times a day (TID) | ORAL | Status: AC | PRN

## 2011-09-14 NOTE — ED Notes (Signed)
Pt resting quietly at present. States that her pain med is not helping.

## 2011-09-14 NOTE — ED Provider Notes (Signed)
History    This chart was scribed for Stacy Anger, DO, MD by Stacy [name]. The patient was seen in room APA07 and the patient's care was started at 8:44AM.   CSN: [id number]  Arrival date & time 09/14/11  0830   First MD Initiated Contact with Patient 09/14/11 [phone]      Chief Complaint  Patient presents with  . Chest Pain  . Shortness of Breath    HPI Stacy [name] is a 26 y.o. female who presents to the Emergency Department complaining of gradual onset and persistence of constant chest pain that began today 1:00am PTA. She describes the pain as "sharp" and "stabbing," worse with palpation of the area and deep breath. Pt reports that she was unable to go to work today because of the pain. Denies strenuous activity/injury, denies SOB/coughing, no fevers, no palpitations, no abd pain, no N/V/D. Pt took ibuprofen without relief.  Pt has been eval in the ED multiple times previously for same complaint.    Past Medical History  Diagnosis Date  . Obesity   . Carpal tunnel syndrome   . Migraine     Past Surgical History  Procedure Date  . Cholecystectomy   . Cesarean section   . Tubal ligation   . Wisdom tooth extraction     Family History  Problem Relation Age of Onset  . Diabetes Mother   . Hypertension Father     History  Substance Use Topics  . Smoking status: Former Games developer  . Smokeless tobacco: Not on file  . Alcohol Use: No    OB History    Grav Para Term Preterm Abortions TAB SAB Ect Mult Living   2 2 2              Review of Systems ROS: Statement: All systems negative except as marked or noted in the HPI; Constitutional: Negative for fever and chills. ; ; Eyes: Negative for eye pain, redness and discharge. ; ; ENMT: Negative for ear pain, hoarseness, nasal congestion, sinus pressure and sore throat. ; ; Cardiovascular: +CP. Negative for palpitations, diaphoresis, dyspnea and peripheral edema. ; ; Respiratory: Negative for cough, wheezing and stridor. ; ;  Gastrointestinal: Negative for nausea, vomiting, diarrhea, abdominal pain, blood in stool, hematemesis, jaundice and rectal bleeding. . ; ; Genitourinary: Negative for dysuria, flank pain and hematuria. ; ; Musculoskeletal: Negative for back pain and neck pain. Negative for swelling and trauma.; ; Skin: Negative for pruritus, rash, abrasions, blisters, bruising and skin lesion.; ; Neuro: Negative for headache, lightheadedness and neck stiffness. Negative for weakness, altered level of consciousness , altered mental status, extremity weakness, paresthesias, involuntary movement, seizure and syncope.     Allergies  Vicodin  Home Medications   Current Outpatient Rx  Name Route Sig Dispense Refill  . IBUPROFEN 200 MG PO TABS Oral Take 800 mg by mouth every 6 (six) hours as needed. Pain      BP 95/50  Pulse 55  Temp 97.9 F (36.6 C)  Resp 20  Ht 5\' 7"  (1.702 m)  Wt 240 lb (108.863 kg)  BMI 37.59 kg/m2  LMP 09/12/2011  Physical Exam 0845: Physical examination:  Nursing notes reviewed; Vital signs and O2 SAT reviewed;  Constitutional: Well developed, Well nourished, Well hydrated, In no acute distress; Head:  Normocephalic, atraumatic; Eyes: EOMI, PERRL, No scleral icterus; ENMT: Mouth and pharynx normal, Mucous membranes moist; Neck: Supple, Full range of motion, No lymphadenopathy; Cardiovascular: Regular rate and rhythm, No murmur, rub,  or gallop; Respiratory: Breath sounds clear & equal bilaterally, No rales, rhonchi, wheezes, or rub, Normal respiratory effort/excursion; Chest: +TTP left upper chest wall and parasternal areas, no soft tissue crepitus, no ecchymosis, no abrasions/erythema, Movement normal; Abdomen: Soft, Nontender, Nondistended, Normal bowel sounds; Extremities: Pulses normal, No tenderness, No edema, No calf edema or asymmetry.; Neuro: AA&Ox3, Major CN grossly intact.  No gross focal motor or sensory deficits in extremities.; Skin: Color normal, Warm, Dry, no rash.    ED  Course  Procedures   MDM  MDM Reviewed: nursing note, vitals and previous chart Reviewed previous: ECG Interpretation: ECG, labs and x-ray    Date: 09/14/2011  Rate: 48  Rhythm: sinus bradycardia  QRS Axis: normal  Intervals: normal  ST/T Wave abnormalities: normal  Conduction Disutrbances:none  Narrative Interpretation:   Old EKG Reviewed: unchanged; no significant changes from previous EKG dated 02/10/2011.  Results for orders placed during the hospital encounter of 09/14/11  TROPONIN I      Component Value Range   Troponin I <0.30  <0.30 (ng/mL)  CBC      Component Value Range   WBC 6.4  4.0 - 10.5 (K/uL)   RBC 4.67  3.87 - 5.11 (MIL/uL)   Hemoglobin 12.5  12.0 - 15.0 (g/dL)   HCT 16.1  09.6 - 04.5 (%)   MCV 78.6  78.0 - 100.0 (fL)   MCH 26.8  26.0 - 34.0 (pg)   MCHC 34.1  30.0 - 36.0 (g/dL)   RDW 40.9  81.1 - 91.4 (%)   Platelets 238  150 - 400 (K/uL)  DIFFERENTIAL      Component Value Range   Neutrophils Relative 62  43 - 77 (%)   Neutro Abs 3.9  1.7 - 7.7 (K/uL)   Lymphocytes Relative 29  12 - 46 (%)   Lymphs Abs 1.9  0.7 - 4.0 (K/uL)   Monocytes Relative 6  3 - 12 (%)   Monocytes Absolute 0.4  0.1 - 1.0 (K/uL)   Eosinophils Relative 3  0 - 5 (%)   Eosinophils Absolute 0.2  0.0 - 0.7 (K/uL)   Basophils Relative 0  0 - 1 (%)   Basophils Absolute 0.0  0.0 - 0.1 (K/uL)  BASIC METABOLIC PANEL      Component Value Range   Sodium 139  135 - 145 (mEq/L)   Potassium 4.1  3.5 - 5.1 (mEq/L)   Chloride 107  96 - 112 (mEq/L)   CO2 25  19 - 32 (mEq/L)   Glucose, Bld 98  70 - 99 (mg/dL)   BUN 14  6 - 23 (mg/dL)   Creatinine, Ser 7.82  0.50 - 1.10 (mg/dL)   Calcium 8.7  8.4 - 95.6 (mg/dL)   GFR calc non Af Amer >90  >90 (mL/min)   GFR calc Af Amer >90  >90 (mL/min)  D-DIMER, QUANTITATIVE      Component Value Range   D-Dimer, Quant 0.24  0.00 - 0.48 (ug/mL-FEU)  URINALYSIS, ROUTINE W REFLEX MICROSCOPIC      Component Value Range   Color, Urine YELLOW  YELLOW     APPearance CLEAR  CLEAR    Specific Gravity, Urine >1.030 (*) 1.005 - 1.030    pH 5.5  5.0 - 8.0    Glucose, UA NEGATIVE  NEGATIVE (mg/dL)   Hgb urine dipstick MODERATE (*) NEGATIVE    Bilirubin Urine NEGATIVE  NEGATIVE    Ketones, ur NEGATIVE  NEGATIVE (mg/dL)   Protein, ur NEGATIVE  NEGATIVE (  mg/dL)   Urobilinogen, UA 0.2  0.0 - 1.0 (mg/dL)   Nitrite NEGATIVE  NEGATIVE    Leukocytes, UA NEGATIVE  NEGATIVE   POCT PREGNANCY, URINE      Component Value Range   Preg Test, Ur NEGATIVE  NEGATIVE   URINE MICROSCOPIC-ADD ON      Component Value Range   Squamous Epithelial / LPF RARE  RARE    WBC, UA 0-2  <3 (WBC/hpf)   RBC / HPF 3-6  <3 (RBC/hpf)   Bacteria, UA FEW (*) RARE    Urine-Other MUCOUS PRESENT     Dg Chest 2 View 09/14/2011  *RADIOLOGY REPORT*  Clinical Data: Chest pain.  CHEST - 2 VIEW  Comparison: 02/10/2011.  Findings: The cardiac silhouette, mediastinal and hilar contours are within normal limits and stable.  Slightly low lung volumes with vascular crowding and streaky atelectasis.  No infiltrates, edema or effusions.  No pneumothorax.  The bony thorax is intact.  IMPRESSION: Slightly low lung volumes with vascular crowding and atelectasis but no infiltrates, edema or effusions.  Original Report Authenticated By: P. Loralie Champagne, M.D.     10:21 AM:  Pt currently has menses.  No UTI.  CP likely msk at this time.  Doubt ACS with normal troponin and EKG after 9 hours of constant pain; doubt PE with neg d-dimer and no hypoxia/tachypnea/tachycardia while in the ED.  Wants to go home now; requesting a work note.  Dx testing d/w pt.  Questions answered.  Verb understanding, agreeable to d/c home with outpt f/u.          I personally performed the services described in this documentation, which was scribed in my presence. The recorded information has been reviewed and considered. Bladyn Tipps Allison Quarry, DO 09/15/11 0825

## 2011-09-14 NOTE — ED Notes (Addendum)
Pt presents to er with chest pain located from mid center to left side chest associated with sob, pt states that the pain woke her up from sleep, pt describes the pain as a sharp stabbing sensation. No n/v, no diaphoresis. Pt also states that pain is worse with cough or deep breath.

## 2011-09-14 NOTE — ED Notes (Signed)
Pt alert and oriented x 3. Skin warm and dry. Color pink. Breath sounds clear and equal bilaterally. Respirations deep and even. Pt showing SB on cardiac monitor. Pt states that the pain is worse with palpation or deep breathing.

## 2011-09-14 NOTE — Discharge Instructions (Signed)
RESOURCE GUIDE  Dental Problems  Patients with Medicaid: [facility]                     [facility] [address]                                           [address] Phone:  [phone]                                                  Phone:  [phone]  If unable to pay or uninsured, contact:  [facility] or [facility] to become qualified for the adult dental clinic.  Chronic Pain Problems Contact [facility]  [phone] Patients need to be referred by their primary care doctor.  Insufficient Money for Medicine Contact [employer]:  call "211" or [facility] 271-5999.  No Primary Care Doctor Call [facility]  832-8000 Other agencies that provide inexpensive medical care    [facility]  [phone]    [facility]  [phone]    [facility]  [phone]    Women's Clinic  [phone]    [facility]  [phone]    [facility]  [phone]  Psychological Services [facility]  [phone] [facility]  [phone] [facility]   [phone] (emergency services [phone])  Substance Abuse Resources [facility]  336-882-2125 [facility] 336-784-9470 [facility] 336-285-9073 [facility] 336-845-3988 Residential & Outpatient Substance Abuse Program  800-659-3381  Abuse/Neglect [place] Child Abuse Hotline (336) 641-3795 [place] Child Abuse Hotline 800-378-5315 (After Hours)  Emergency Shelter [facility] (336) 271-5985  Maternity Homes [facility] (336) 275-9566 [facility] (704) 372-4663  MRSA Hotline #:   832-7006    [place] Resources  [facility] [place] [facility]     [employer]                          [facility] [address] [place]                       [address]      [address]  [place]                                                [place]                            [place] Phone:  [phone]                                   Phone:  [phone]                 Phone:  [phone]  [facility] Phone:  [phone]    [facility] Child Abuse Hotline (479)163-0531 409-686-1970 (After Hours)   Take the prescriptions as directed.  Do not take ibuprofen (motrin, advil) while taking the naprosyn.  Apply moist heat or ice to the area(s) of discomfort, for 15 minutes at a time, several times per day for the next few days.  Do not fall asleep on a heating or ice pack.  Call your regular medical doctor today to schedule a follow up appointment this week.  Return to the Emergency Department immediately if worsening.

## 2011-09-16 ENCOUNTER — Emergency Department (INDEPENDENT_AMBULATORY_CARE_PROVIDER_SITE_OTHER)
Admission: EM | Admit: 2011-09-16 | Discharge: 2011-09-16 | Disposition: A | Discharge status: Home / Self Care | Payer: Medicaid Other | Source: Home / Self Care | Attending: Family Medicine | Admitting: Family Medicine

## 2011-09-16 ENCOUNTER — Encounter (HOSPITAL_COMMUNITY): Payer: Self-pay | Admitting: Emergency Medicine

## 2011-09-16 DIAGNOSIS — J069 Acute upper respiratory infection, unspecified: Secondary | ICD-10-CM

## 2011-09-16 MED ORDER — AMOXICILLIN 875 MG PO TABS: 875.0000 mg | ORAL_TABLET | Freq: Two times a day (BID) | ORAL | Status: AC

## 2011-09-16 NOTE — Discharge Instructions (Signed)
Tylenol or Ibuprofen as needed for discomfort. Increase fluids. May also use throat lozenges as needed for sore throat. Take an over the counter cold & cough medication as needed for nasal congestion and drainage. If you need help choosing one ask the pharmacist for advice. Return if symptoms change or worsen.    Upper Respiratory Infection, Adult An upper respiratory infection (URI) is also known as the common cold. It is often caused by a type of germ (virus). Colds are easily spread (contagious). You can pass it to others by kissing, coughing, sneezing, or drinking out of the same glass. Usually, you get better in 1 or 2 weeks.  HOME CARE   Only take medicine as told by your doctor.   Use a warm mist humidifier or breathe in steam from a hot shower.   Drink enough water and fluids to keep your pee (urine) clear or pale yellow.   Get plenty of rest.   Return to work when your temperature is back to normal or as told by your doctor. You may use a face mask and wash your hands to stop your cold from spreading.  GET HELP RIGHT AWAY IF:   After the first few days, you feel you are getting worse.   You have questions about your medicine.   You have chills, shortness of breath, or brown or red spit (mucus).   You have yellow or brown snot (nasal discharge) or pain in the face, especially when you bend forward.   You have a fever, puffy (swollen) neck, pain when you swallow, or white spots in the back of your throat.   You have a bad headache, ear pain, sinus pain, or chest pain.   You have a high-pitched whistling sound when you breathe in and out (wheezing).   You have a lasting cough or cough up blood.   You have sore muscles or a stiff neck.  MAKE SURE YOU:   Understand these instructions.   Will watch your condition.   Will get help right away if you are not doing well or get worse.  Document Released: 11/25/2007 Document Revised: 05/28/2011 Document Reviewed:  10/13/2010 [facility] Patient Information 2012 [place], [place].

## 2011-09-16 NOTE — ED Provider Notes (Signed)
History     CSN: [id number]  Arrival date & time 09/16/11  4098   First MD Initiated Contact with Patient 09/16/11 1024      Chief Complaint  Patient presents with  . Sore Throat  . Sinusitis    (Consider location/radiation/quality/duration/timing/severity/associated sxs/prior treatment) HPI Comments: Patient presents today with complaints of sore throat, ear discomfort, nasal congestion and cough for 3 days. She states the cough is productive with yellow sputum. No fever or chills. She hasn't taken an over-the-counter cough medication without relief.   Past Medical History  Diagnosis Date  . Obesity   . Carpal tunnel syndrome   . Migraine     Past Surgical History  Procedure Date  . Cholecystectomy   . Cesarean section   . Tubal ligation   . Wisdom tooth extraction     Family History  Problem Relation Age of Onset  . Diabetes Mother   . Hypertension Father     History  Substance Use Topics  . Smoking status: Former Games developer  . Smokeless tobacco: Not on file  . Alcohol Use: No    OB History    Grav Para Term Preterm Abortions TAB SAB Ect Mult Living   2 2 2              Review of Systems  Constitutional: Negative for fever, chills and fatigue.  HENT: Positive for ear pain, congestion, sore throat and rhinorrhea. Negative for sneezing, postnasal drip and sinus pressure.   Respiratory: Positive for cough. Negative for shortness of breath and wheezing.   Cardiovascular: Negative for chest pain.    Allergies  Vicodin  Home Medications   Current Outpatient Rx  Name Route Sig Dispense Refill  . AMOXICILLIN 875 MG PO TABS Oral Take 1 tablet (875 mg total) by mouth 2 (two) times daily. 20 tablet 0  . CYCLOBENZAPRINE HCL 10 MG PO TABS Oral Take 1 tablet (10 mg total) by mouth 3 (three) times daily as needed for muscle spasms. 20 tablet 0  . IBUPROFEN 200 MG PO TABS Oral Take 800 mg by mouth every 6 (six) hours as needed. Pain    . NAPROXEN 250 MG PO TABS Oral  Take 1 tablet (250 mg total) by mouth 2 (two) times daily with a meal. 14 tablet 0    BP 119/80  Pulse 74  Temp(Src) 97.2 F (36.2 C) (Oral)  Resp 20  SpO2 99%  LMP 09/12/2011  Physical Exam  Nursing note and vitals reviewed. Constitutional: She appears well-developed and well-nourished. No distress.  HENT:  Head: Normocephalic and atraumatic.  Right Ear: Tympanic membrane, external ear and ear canal normal.  Left Ear: Tympanic membrane, external ear and ear canal normal.  Nose: Nose normal.  Mouth/Throat: Uvula is midline, oropharynx is clear and moist and mucous membranes are normal. No oropharyngeal exudate, posterior oropharyngeal edema or posterior oropharyngeal erythema.  Neck: Neck supple.  Cardiovascular: Normal rate, regular rhythm and normal heart sounds.   Pulmonary/Chest: Effort normal and breath sounds normal. No respiratory distress.  Lymphadenopathy:    She has no cervical adenopathy.  Neurological: She is alert.  Skin: Skin is warm and dry.  Psychiatric: She has a normal mood and affect.    ED Course  Procedures (including critical care time)   Labs Reviewed  POCT RAPID STREP A ([facility] CARE ONLY)   No results found.   1. Acute URI       MDM  Rapid strep neg.  Melody Comas, [place] 09/16/11 1135

## 2011-09-16 NOTE — ED Notes (Signed)
PT HERE WITH SORE THROAT,NECK PAIN RADIATING TO RIGHT EAR,COUGH WITH YELLOW PHLEGM AND NASAL CONGESTION.SX STARTED X 3 DYS AGO.AFEBRILE.PT HAS BEEN TAKING OTC MEDS BUT NOT WORKING

## 2011-09-17 NOTE — ED Provider Notes (Signed)
Medical screening examination/treatment/procedure(s) were performed by non-physician practitioner and as supervising physician I was immediately available for consultation/collaboration.  Alen Bleacher, MD 09/17/11 1356

## 2011-09-27 ENCOUNTER — Emergency Department (HOSPITAL_COMMUNITY): Payer: Medicaid Other

## 2011-09-27 ENCOUNTER — Emergency Department (HOSPITAL_COMMUNITY)
Admission: EM | Admit: 2011-09-27 | Discharge: 2011-09-27 | Disposition: A | Discharge status: Home / Self Care | Payer: Medicaid Other | Attending: Emergency Medicine | Admitting: Emergency Medicine

## 2011-09-27 ENCOUNTER — Encounter (HOSPITAL_COMMUNITY): Payer: Self-pay | Admitting: Emergency Medicine

## 2011-09-27 DIAGNOSIS — Y92009 Unspecified place in unspecified non-institutional (private) residence as the place of occurrence of the external cause: Secondary | ICD-10-CM | POA: Insufficient documentation

## 2011-09-27 DIAGNOSIS — S8000XA Contusion of unspecified knee, initial encounter: Secondary | ICD-10-CM | POA: Insufficient documentation

## 2011-09-27 DIAGNOSIS — W010XXA Fall on same level from slipping, tripping and stumbling without subsequent striking against object, initial encounter: Secondary | ICD-10-CM | POA: Insufficient documentation

## 2011-09-27 DIAGNOSIS — S8002XA Contusion of left knee, initial encounter: Secondary | ICD-10-CM

## 2011-09-27 DIAGNOSIS — E669 Obesity, unspecified: Secondary | ICD-10-CM | POA: Insufficient documentation

## 2011-09-27 DIAGNOSIS — M25569 Pain in unspecified knee: Secondary | ICD-10-CM | POA: Insufficient documentation

## 2011-09-27 DIAGNOSIS — Y9302 Activity, running: Secondary | ICD-10-CM | POA: Insufficient documentation

## 2011-09-27 MED ORDER — TRAMADOL HCL 50 MG PO TABS
50.0000 mg | ORAL_TABLET | Freq: Once | ORAL | Status: AC
  Administered 2011-09-27: 50 mg via ORAL
  Filled 2011-09-27: qty 1

## 2011-09-27 MED ORDER — IBUPROFEN 800 MG PO TABS
800.0000 mg | ORAL_TABLET | Freq: Once | ORAL | Status: AC
  Administered 2011-09-27: 800 mg via ORAL
  Filled 2011-09-27: qty 1

## 2011-09-27 MED ORDER — IBUPROFEN 600 MG PO TABS: 600.0000 mg | ORAL_TABLET | Freq: Four times a day (QID) | ORAL | Status: AC | PRN

## 2011-09-27 MED ORDER — TRAMADOL HCL 50 MG PO TABS: 50.0000 mg | ORAL_TABLET | Freq: Four times a day (QID) | ORAL | Status: AC | PRN

## 2011-09-27 NOTE — ED Notes (Signed)
Pt states tripped over carpet in her home while running "to get the phone" landing on her knee. Pt has minimal swelling and redness noted to left knee. Weight bearing increases pain per pt. Pt given crutches with instructions on use. Return demonstration without difficulty noted. Ice pack also provided and ace wrap completed. Pt verbalized understanding.

## 2011-09-27 NOTE — Discharge Instructions (Signed)
Contusion A contusion is a deep bruise. Contusions are the result of an injury that caused bleeding under the skin. The contusion may turn blue, purple, or yellow. Minor injuries will give you a painless contusion, but more severe contusions may stay painful and swollen for a few weeks.  CAUSES  A contusion is usually caused by a blow, trauma, or direct force to an area of the body. SYMPTOMS   Swelling and redness of the injured area.   Bruising of the injured area.   Tenderness and soreness of the injured area.   Pain.  DIAGNOSIS  The diagnosis can be made by taking a history and physical exam. An X-ray, CT scan, or MRI may be needed to determine if there were any associated injuries, such as fractures. TREATMENT  Specific treatment will depend on what area of the body was injured. In general, the best treatment for a contusion is resting, icing, elevating, and applying cold compresses to the injured area. Over-the-counter medicines may also be recommended for pain control. Ask your caregiver what the best treatment is for your contusion. HOME CARE INSTRUCTIONS   Put ice on the injured area.   Put ice in a plastic bag.   Place a towel between your skin and the bag.   Leave the ice on for 15 to 20 minutes, 3 to 4 times a day.   Only take over-the-counter or prescription medicines for pain, discomfort, or fever as directed by your caregiver. Your caregiver may recommend avoiding anti-inflammatory medicines (aspirin, ibuprofen, and naproxen) for 48 hours because these medicines may increase bruising.   Rest the injured area.   If possible, elevate the injured area to reduce swelling.  SEEK IMMEDIATE MEDICAL CARE IF:   You have increased bruising or swelling.   You have pain that is getting worse.   Your swelling or pain is not relieved with medicines.  MAKE SURE YOU:   Understand these instructions.   Will watch your condition.   Will get help right away if you are not  doing well or get worse.  Document Released: 03/18/2005 Document Revised: 05/28/2011 Document Reviewed: 04/13/2011 [facility] Patient Information 2012 [place], [facility].'    Use the medicines prescribed for pain and swelling.  Also, an ice pack and elevation will help with your pain.  Use the crutches given as needed for comfort.  Expect gradual improvement over the next week, but get rechecked if your symptoms are not improving as expected.

## 2011-09-27 NOTE — ED Notes (Signed)
Pt ran through house to answer phone and tripped over vacuum and injured left knee.

## 2011-09-28 NOTE — ED Provider Notes (Signed)
History     CSN: [id number]  Arrival date & time 09/27/11  2023   First MD Initiated Contact with Patient 09/27/11 2049      Chief Complaint  Patient presents with  . Knee Pain    (Consider location/radiation/quality/duration/timing/severity/associated sxs/prior treatment) HPI Comments: Patient fell on her bent left knee as she was running through her house and tripped.  Patient is a 26 y.o. female presenting with knee pain. The history is provided by the patient.  Knee Pain This is a new problem. The current episode started today. The problem occurs constantly. The problem has been unchanged. Associated symptoms include arthralgias. Pertinent negatives include no chest pain, fever, joint swelling, nausea, neck pain, numbness, rash or weakness. The symptoms are aggravated by bending, twisting, standing and walking. She has tried nothing for the symptoms.    Past Medical History  Diagnosis Date  . Obesity   . Carpal tunnel syndrome   . Migraine     Past Surgical History  Procedure Date  . Cholecystectomy   . Cesarean section   . Tubal ligation   . Wisdom tooth extraction     Family History  Problem Relation Age of Onset  . Diabetes Mother   . Hypertension Father     History  Substance Use Topics  . Smoking status: Former Games developer  . Smokeless tobacco: Not on file  . Alcohol Use: No    OB History    Grav Para Term Preterm Abortions TAB SAB Ect Mult Living   2 2 2              Review of Systems  Constitutional: Negative for fever.  HENT: Negative for neck pain.   Eyes: Negative.   Respiratory: Negative.   Cardiovascular: Negative for chest pain.  Gastrointestinal: Negative for nausea.  Genitourinary: Negative.   Musculoskeletal: Positive for arthralgias. Negative for joint swelling.  Skin: Negative.  Negative for rash and wound.  Neurological: Negative for weakness and numbness.  Hematological: Negative.   Psychiatric/Behavioral: Negative.     Allergies   Vicodin  Home Medications   Current Outpatient Rx  Name Route Sig Dispense Refill  . IBUPROFEN 200 MG PO TABS Oral Take 800 mg by mouth every 6 (six) hours as needed. Pain    . IBUPROFEN 600 MG PO TABS Oral Take 1 tablet (600 mg total) by mouth every 6 (six) hours as needed for pain. 24 tablet 0  . NAPROXEN 250 MG PO TABS Oral Take 1 tablet (250 mg total) by mouth 2 (two) times daily with a meal. 14 tablet 0  . TRAMADOL HCL 50 MG PO TABS Oral Take 1 tablet (50 mg total) by mouth every 6 (six) hours as needed for pain. 15 tablet 0    BP 118/87  Pulse 56  Temp(Src) 97.8 F (36.6 C) (Oral)  Resp 16  SpO2 100%  LMP 09/12/2011  Physical Exam  Nursing note and vitals reviewed. Constitutional: She is oriented to person, place, and time. She appears well-developed and well-nourished.  HENT:  Head: Normocephalic.  Eyes: Conjunctivae are normal.  Neck: Normal range of motion.  Cardiovascular: Normal rate and intact distal pulses.  Exam reveals no decreased pulses.   Pulses:      Dorsalis pedis pulses are 2+ on the right side, and 2+ on the left side.       Posterior tibial pulses are 2+ on the right side, and 2+ on the left side.  Pulmonary/Chest: Effort normal.  Musculoskeletal: She exhibits  edema and tenderness.       Left knee: She exhibits swelling and bony tenderness. She exhibits no effusion, no ecchymosis, no deformity, no laceration, no LCL laxity and no MCL laxity. tenderness found. Patellar tendon tenderness noted.       Tender over inferior patellar tendon.  Pt can SLR with no weakness of knee joint. Left ankle and hip normal.  Neurological: She is alert and oriented to person, place, and time. No sensory deficit.  Skin: Skin is warm, dry and intact.    ED Course  Procedures (including critical care time)  Labs Reviewed - No data to display Dg Knee Complete 4 Views Left  09/27/2011  *RADIOLOGY REPORT*  Clinical Data: Anterior knee pain secondary to a fall today.  LEFT  KNEE - COMPLETE 4+ VIEW  Comparison: 05/24/2011  Findings: There is no fracture, dislocation, or joint effusion.  IMPRESSION: Normal exam.  Original Report Authenticated By: Gwynn Burly, M.D.     1. Contusion of left knee       MDM  Ace,  Crutches.  RICE.   Ibuprofen,  Ultram prescribed.        Candis Musa, PA 09/28/11 1227

## 2011-09-29 NOTE — ED Provider Notes (Signed)
Medical screening examination/treatment/procedure(s) were performed by non-physician practitioner and as supervising physician I was immediately available for consultation/collaboration. Prinston Kynard, MD, FACEP   Clela Hagadorn L Aysiah Jurado, MD 09/29/11 0048 

## 2012-01-18 ENCOUNTER — Encounter (HOSPITAL_COMMUNITY): Payer: Self-pay

## 2012-01-18 ENCOUNTER — Emergency Department (HOSPITAL_COMMUNITY): Payer: Medicaid Other

## 2012-01-18 ENCOUNTER — Emergency Department (HOSPITAL_COMMUNITY)
Admission: EM | Admit: 2012-01-18 | Discharge: 2012-01-18 | Disposition: A | Discharge status: Home / Self Care | Payer: Medicaid Other | Attending: Emergency Medicine | Admitting: Emergency Medicine

## 2012-01-18 DIAGNOSIS — S8392XA Sprain of unspecified site of left knee, initial encounter: Secondary | ICD-10-CM

## 2012-01-18 DIAGNOSIS — W108XXA Fall (on) (from) other stairs and steps, initial encounter: Secondary | ICD-10-CM | POA: Insufficient documentation

## 2012-01-18 DIAGNOSIS — IMO0002 Reserved for concepts with insufficient information to code with codable children: Secondary | ICD-10-CM | POA: Insufficient documentation

## 2012-01-18 MED ORDER — OXYCODONE-ACETAMINOPHEN 5-325 MG PO TABS: 1.0000 | ORAL_TABLET | ORAL | Status: AC | PRN

## 2012-01-18 NOTE — ED Notes (Signed)
Pt reports falling down 8 steps this a.m.  Pt reports injuring her left knee.  Pt denies any head injury or LOC.

## 2012-01-18 NOTE — ED Provider Notes (Signed)
History  This chart was scribed for [employer]. Stacy Payor, MD by Stacy [name]. This patient was seen in room APA18/APA18 and the patient's care was started at 7:34AM.  CSN: [id number]  Arrival date & time 01/18/12  0734   None     Chief Complaint  Patient presents with  . Fall    The history is provided by the patient. No language interpreter was used.    Stacy [name] is a 26 y.o. female who presents to the Emergency Department complaining of sudden onset, gradually worsening, constant left knee pain after a fall that occurred approximately one hour PTA. Pt reports that she miss stepped and fell down 8 steps landing on her left knee. She states that the pain is worse with walking. She reports experiencing chronic left knee pain due to prior falls several years ago. She denies head injury, LOC, wounds, nausea, emesis as associated symptoms. She has a h/o migraine and carpel tunnel syndrome. She is a former smoker but denies alcohol use.  PCP is Dr. Tanya [name].  Past Medical History  Diagnosis Date  . Obesity   . Carpal tunnel syndrome   . Migraine     Past Surgical History  Procedure Date  . Cholecystectomy   . Cesarean section   . Tubal ligation   . Wisdom tooth extraction     Family History  Problem Relation Age of Onset  . Diabetes Mother   . Hypertension Father     History  Substance Use Topics  . Smoking status: Former Games developer  . Smokeless tobacco: Not on file  . Alcohol Use: No    OB History    Grav Para Term Preterm Abortions TAB SAB Ect Mult Living   2 2 2              Review of Systems  Gastrointestinal: Negative for nausea and vomiting.  Musculoskeletal: Negative for back pain.       Left knee pain  Skin: Negative for wound.  Neurological: Negative for syncope and headaches.    Allergies  Vicodin  Home Medications   Current Outpatient Rx  Name Route Sig Dispense Refill  . IBUPROFEN 200 MG PO TABS Oral Take 800 mg by mouth every 6 (six) hours  as needed. Pain    . OXYCODONE-ACETAMINOPHEN 5-325 MG PO TABS Oral Take 1-2 tablets by mouth every 4 (four) hours as needed for pain. 15 tablet 0    Triage Vitals: BP 123/63  Pulse 76  Temp 97.7 F (36.5 C)  Resp 18  SpO2 97%  LMP 01/18/2012  Physical Exam  Nursing note and vitals reviewed. Constitutional: She is oriented to person, place, and time. She appears well-developed and well-nourished. No distress.  HENT:  Head: Normocephalic and atraumatic.  Eyes: EOM are normal.  Neck: Neck supple. No tracheal deviation present.  Cardiovascular: Normal rate.   Pulmonary/Chest: Effort normal. No respiratory distress.  Musculoskeletal: Normal range of motion.       Tenderness over the left MCL, left knee is stable, no effusion noted, neurovascularly intact  Neurological: She is alert and oriented to person, place, and time.  Skin: Skin is warm and dry.  Psychiatric: She has a normal mood and affect. Her behavior is normal.    ED Course  Procedures (including critical care time)  COORDINATION OF CARE: 7:48AM-Discussed treatment plan which includes x-ray and pain medication with pt at bedside and pt agreed to plan. 8:39AM-Informed pt of radiology report. Discussed discharge plan of pain medication.  Advised against using a knee embolizer.   Labs Reviewed - No data to display Dg Knee Complete 4 Views Left  01/18/2012  *RADIOLOGY REPORT*  Clinical Data: Fall, medial knee pain  LEFT KNEE - COMPLETE 4+ VIEW  Comparison: None.  Findings: No fracture or dislocation is seen.  The joint spaces are preserved.  The visualized soft tissues are unremarkable.  No suprapatellar knee joint effusion.  IMPRESSION: No fracture or dislocation is seen.  Original Report Authenticated By: Stacy [name], M.D.     1. Left knee sprain       MDM  patietn with left knee pain after fall. No effusion and rather benign exam. Will d/c with pain meds and ortho follow up as needed  I personally performed  the services described in this documentation, which was scribed in my presence. The recorded information has been reviewed and considered.          Stacy [name]. Stacy Payor, MD 01/18/12 [phone]

## 2012-04-24 ENCOUNTER — Emergency Department (HOSPITAL_COMMUNITY)
Admission: EM | Admit: 2012-04-24 | Discharge: 2012-04-24 | Disposition: A | Discharge status: Home / Self Care | Payer: Medicaid Other | Source: Home / Self Care | Attending: Emergency Medicine | Admitting: Emergency Medicine

## 2012-04-24 ENCOUNTER — Encounter (HOSPITAL_COMMUNITY): Payer: Self-pay | Admitting: Emergency Medicine

## 2012-04-24 DIAGNOSIS — J040 Acute laryngitis: Secondary | ICD-10-CM

## 2012-04-24 LAB — POCT RAPID STREP A: Streptococcus, Group A Screen (Direct): NEGATIVE

## 2012-04-24 MED ORDER — PREDNISONE 5 MG PO KIT: 1.0000 | PACK | Freq: Every day | ORAL | Status: DC

## 2012-04-24 MED ORDER — BENZONATATE 200 MG PO CAPS: 200.0000 mg | ORAL_CAPSULE | Freq: Three times a day (TID) | ORAL | Status: DC | PRN

## 2012-04-24 MED ORDER — FEXOFENADINE-PSEUDOEPHED ER 60-120 MG PO TB12: 1.0000 | ORAL_TABLET | Freq: Two times a day (BID) | ORAL | Status: DC

## 2012-04-24 MED ORDER — NAPROXEN 500 MG PO TABS: 500.0000 mg | ORAL_TABLET | Freq: Two times a day (BID) | ORAL | Status: DC

## 2012-04-24 NOTE — ED Provider Notes (Signed)
Chief Complaint  Patient presents with  . Sore Throat    History of Present Illness:   The patient is a 26 year old female who has had a two-day history of burning of the throat, hoarseness, dry cough, and chest tightness. She has constant pain over the sternal area which is worse with inspiration and coughing, has felt chilled, has some sneezing. She's been exposed to strep and flu. She denies any nasal congestion, rhinorrhea, headache, or GI symptoms.  Review of Systems:  Other than noted above, the patient denies any of the following symptoms. Systemic:  No fever, chills, sweats, fatigue, myalgias, headache, or anorexia. Eye:  No redness, pain or drainage. ENT:  No earache, ear congestion, nasal congestion, sneezing, rhinorrhea, sinus pressure, sinus pain, post nasal drip, or sore throat. Lungs:  No cough, sputum production, wheezing, shortness of breath, or chest pain. GI:  No abdominal pain, nausea, vomiting, or diarrhea.  PMFSH:  Past medical history, family history, social history, meds, and allergies were reviewed.  Physical Exam:   Vital signs:  BP 115/72  Pulse 60  Temp 98.5 F (36.9 C) (Oral)  Resp 16  SpO2 100% General:  Alert, in no distress. Eye:  No conjunctival injection or drainage. Lids were normal. ENT:  TMs and canals were normal, without erythema or inflammation.  Nasal mucosa was clear and uncongested, without drainage.  Mucous membranes were moist.  Pharynx was clear, with copious, mucoid postnasal drainage.  There were no oral ulcerations or lesions. Neck:  Supple, no adenopathy, tenderness or mass. Lungs:  No respiratory distress.  Lungs were clear to auscultation, without wheezes, rales or rhonchi.  Breath sounds were clear and equal bilaterally.  Heart:  Regular rhythm, without gallops, murmers or rubs. Skin:  Clear, warm, and dry, without rash or lesions.  Labs:   Results for orders placed during the hospital encounter of 04/24/12  POCT RAPID STREP A ([facility]  URG CARE ONLY)      Component Value Range   Streptococcus, Group A Screen (Direct) NEGATIVE  NEGATIVE   Assessment:  The encounter diagnosis was Laryngitis.  Plan:   1.  The following meds were prescribed:   New Prescriptions   BENZONATATE (TESSALON) 200 MG CAPSULE    Take 1 capsule (200 mg total) by mouth 3 (three) times daily as needed for cough.   FEXOFENADINE-PSEUDOEPHEDRINE (ALLEGRA-D) 60-120 MG PER TABLET    Take 1 tablet by mouth every 12 (twelve) hours.   NAPROXEN (NAPROSYN) 500 MG TABLET    Take 1 tablet (500 mg total) by mouth 2 (two) times daily.   PREDNISONE 5 MG KIT    Take 1 kit (5 mg total) by mouth daily after breakfast. Prednisone 5 mg 6 day dosepack.  Take as directed.   2.  The patient was instructed in symptomatic care and handouts were given. 3.  The patient was told to return if becoming worse in any way, if no better in 3 or 4 days, and given some red flag symptoms that would indicate earlier return.   Reuben Likes, MD 04/24/12 [phone]

## 2012-04-24 NOTE — ED Notes (Signed)
C/o sore throat since yesterday morning.   Tried OTC medication but no relief.  Denies fever and congestion.  Does have chills.  Burns when she swallows, eat, and drinks.

## 2012-05-25 ENCOUNTER — Emergency Department (HOSPITAL_COMMUNITY)
Admission: EM | Admit: 2012-05-25 | Discharge: 2012-05-25 | Disposition: A | Discharge status: Home / Self Care | Payer: Medicaid Other | Attending: Emergency Medicine | Admitting: Emergency Medicine

## 2012-05-25 ENCOUNTER — Encounter (HOSPITAL_COMMUNITY): Payer: Self-pay | Admitting: *Deleted

## 2012-05-25 DIAGNOSIS — G5600 Carpal tunnel syndrome, unspecified upper limb: Secondary | ICD-10-CM | POA: Insufficient documentation

## 2012-05-25 DIAGNOSIS — E669 Obesity, unspecified: Secondary | ICD-10-CM | POA: Insufficient documentation

## 2012-05-25 DIAGNOSIS — Z8669 Personal history of other diseases of the nervous system and sense organs: Secondary | ICD-10-CM | POA: Insufficient documentation

## 2012-05-25 DIAGNOSIS — Z87891 Personal history of nicotine dependence: Secondary | ICD-10-CM | POA: Insufficient documentation

## 2012-05-25 DIAGNOSIS — M5380 Other specified dorsopathies, site unspecified: Secondary | ICD-10-CM | POA: Insufficient documentation

## 2012-05-25 DIAGNOSIS — M62830 Muscle spasm of back: Secondary | ICD-10-CM

## 2012-05-25 DIAGNOSIS — Z8781 Personal history of (healed) traumatic fracture: Secondary | ICD-10-CM | POA: Insufficient documentation

## 2012-05-25 LAB — URINALYSIS, ROUTINE W REFLEX MICROSCOPIC
Glucose, UA: NEGATIVE mg/dL
Ketones, ur: NEGATIVE mg/dL
Nitrite: NEGATIVE
Specific Gravity, Urine: 1.031 — ABNORMAL HIGH (ref 1.005–1.030)
pH: 5.5 (ref 5.0–8.0)

## 2012-05-25 LAB — URINE MICROSCOPIC-ADD ON

## 2012-05-25 MED ORDER — TRAMADOL HCL 50 MG PO TABS: 50.0000 mg | ORAL_TABLET | Freq: Four times a day (QID) | ORAL | Status: DC | PRN

## 2012-05-25 MED ORDER — METHOCARBAMOL 500 MG PO TABS: 500.0000 mg | ORAL_TABLET | Freq: Two times a day (BID) | ORAL | Status: DC

## 2012-05-25 MED ORDER — MELOXICAM 15 MG PO TABS: 15.0000 mg | ORAL_TABLET | Freq: Every day | ORAL | Status: DC
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
## 2012-05-25 NOTE — ED Notes (Signed)
Pt reports bilateral mid back pain that started 3 days ago.  Pt went to PCP and was told that she has having muscle spasms.  States that he did not give her any medication.  Heating pad not working.  Pt denies injury.

## 2012-05-26 LAB — URINE CULTURE: Colony Count: 60000

## 2012-05-31 NOTE — ED Provider Notes (Signed)
Medical screening examination/treatment/procedure(s) were performed by non-physician practitioner and as supervising physician I was immediately available for consultation/collaboration.  Carmel Waddington T Zeanna Sunde, MD 05/31/12 0807 

## 2012-09-20 ENCOUNTER — Telehealth: Payer: Self-pay | Admitting: Family Medicine

## 2012-09-20 MED ORDER — METRONIDAZOLE 500 MG PO TABS: 500.0000 mg | ORAL_TABLET | Freq: Two times a day (BID) | ORAL | Status: DC

## 2012-09-20 NOTE — Telephone Encounter (Signed)
Med e-scribed, pt aware

## 2012-09-20 NOTE — Telephone Encounter (Signed)
done

## 2012-09-20 NOTE — Telephone Encounter (Signed)
Flagyl only treats BV.  Does she have any symptoms of BV?

## 2012-09-20 NOTE — Telephone Encounter (Signed)
Pt states that she get a weird feeling after her period and it is not every time but she can tell when it is going to get worse.

## 2012-09-24 ENCOUNTER — Emergency Department (HOSPITAL_COMMUNITY)
Admission: EM | Admit: 2012-09-24 | Discharge: 2012-09-24 | Disposition: A | Discharge status: Home / Self Care | Payer: Medicaid Other | Attending: Emergency Medicine | Admitting: Emergency Medicine

## 2012-09-24 ENCOUNTER — Encounter (HOSPITAL_COMMUNITY): Payer: Self-pay | Admitting: *Deleted

## 2012-09-24 DIAGNOSIS — R109 Unspecified abdominal pain: Secondary | ICD-10-CM | POA: Insufficient documentation

## 2012-09-24 DIAGNOSIS — Z8669 Personal history of other diseases of the nervous system and sense organs: Secondary | ICD-10-CM | POA: Insufficient documentation

## 2012-09-24 DIAGNOSIS — N390 Urinary tract infection, site not specified: Secondary | ICD-10-CM | POA: Insufficient documentation

## 2012-09-24 DIAGNOSIS — E669 Obesity, unspecified: Secondary | ICD-10-CM | POA: Insufficient documentation

## 2012-09-24 DIAGNOSIS — R110 Nausea: Secondary | ICD-10-CM | POA: Insufficient documentation

## 2012-09-24 DIAGNOSIS — Z3202 Encounter for pregnancy test, result negative: Secondary | ICD-10-CM | POA: Insufficient documentation

## 2012-09-24 DIAGNOSIS — Z8679 Personal history of other diseases of the circulatory system: Secondary | ICD-10-CM | POA: Insufficient documentation

## 2012-09-24 DIAGNOSIS — R300 Dysuria: Secondary | ICD-10-CM | POA: Insufficient documentation

## 2012-09-24 LAB — URINE MICROSCOPIC-ADD ON

## 2012-09-24 LAB — URINALYSIS, ROUTINE W REFLEX MICROSCOPIC
Bilirubin Urine: NEGATIVE
Glucose, UA: NEGATIVE mg/dL
Ketones, ur: NEGATIVE mg/dL
Protein, ur: NEGATIVE mg/dL

## 2012-09-24 MED ORDER — PHENAZOPYRIDINE HCL 200 MG PO TABS: 200.0000 mg | ORAL_TABLET | Freq: Three times a day (TID) | ORAL | Status: DC

## 2012-09-24 MED ORDER — NITROFURANTOIN MONOHYD MACRO 100 MG PO CAPS: 100.0000 mg | ORAL_CAPSULE | Freq: Two times a day (BID) | ORAL | Status: DC

## 2012-09-24 NOTE — ED Notes (Signed)
C/o dysuria, urinary frequency, lower abd pain x 2 days. "I have a UTI"

## 2012-09-24 NOTE — ED Provider Notes (Signed)
Medical screening examination/treatment/procedure(s) were performed by non-physician practitioner and as supervising physician I was immediately available for consultation/collaboration.   Dione Booze, MD 09/24/12 [phone]

## 2012-09-24 NOTE — ED Notes (Signed)
Denies vaginal discharge, hematuria, n/v/d. C/o pain only with urination

## 2012-09-24 NOTE — ED Provider Notes (Signed)
History     CSN: [id number]  Arrival date & time 09/24/12  4098   First MD Initiated Contact with Patient 09/24/12 [phone]      Chief Complaint  Patient presents with  . Urinary Frequency  . Abdominal Pain    (Consider location/radiation/quality/duration/timing/severity/associated sxs/prior treatment) Patient is a 27 y.o. female presenting with frequency and abdominal pain. The history is provided by the patient. No language interpreter was used.  Urinary Frequency This is a new problem. The current episode started in the past 7 days. The problem occurs constantly. The problem has been unchanged. Associated symptoms include abdominal pain, nausea and urinary symptoms. Pertinent negatives include no fever or vomiting. Nothing aggravates the symptoms. She has tried NSAIDs for the symptoms. The treatment provided no relief.  Abdominal Pain Associated symptoms: nausea   Associated symptoms: no fever and no vomiting   Pt is 26yo female c/o dysuria, urinary frequency and lower abdominal pain for past 2 days.  States she believes it is a UTI.  "Feels just like when I had UTIs when I was pregnant 7 years ago." States abdominal pain is a pressure after she pees.  Pt states she has felt nauseous but has not vomited.  Denies fevers, hematuria, vaginal discharge, or diarrhea.  Has tried ibuprofen and cranberry juice w/o relief.   Past Medical History  Diagnosis Date  . Obesity   . Carpal tunnel syndrome   . Migraine     Past Surgical History  Procedure Laterality Date  . Cholecystectomy    . Cesarean section    . Tubal ligation    . Wisdom tooth extraction    . Cesarean section      Family History  Problem Relation Age of Onset  . Diabetes Mother   . Hypertension Father     History  Substance Use Topics  . Smoking status: Former Games developer  . Smokeless tobacco: Not on file  . Alcohol Use: No    OB History   Grav Para Term Preterm Abortions TAB SAB Ect Mult Living   2 2 2               Review of Systems  Constitutional: Negative for fever.  Gastrointestinal: Positive for nausea and abdominal pain. Negative for vomiting.  Genitourinary: Positive for frequency.    Allergies  Coconut flavor; Pineapple; and Vicodin  Home Medications   Current Outpatient Rx  Name  Route  Sig  Dispense  Refill  . nitrofurantoin, macrocrystal-monohydrate, (MACROBID) 100 MG capsule   Oral   Take 1 capsule (100 mg total) by mouth 2 (two) times daily.   10 capsule   0   . phenazopyridine (PYRIDIUM) 200 MG tablet   Oral   Take 1 tablet (200 mg total) by mouth 3 (three) times daily.   6 tablet   0     BP 104/53  Pulse 68  Temp(Src) 98.1 F (36.7 C) (Oral)  Ht 5\' 7"  (1.702 m)  Wt 225 lb (102.059 kg)  BMI 35.23 kg/m2  SpO2 99%  LMP 09/01/2012  Physical Exam  Nursing note and vitals reviewed. Constitutional: She appears well-developed and well-nourished.  HENT:  Head: Normocephalic and atraumatic.  Eyes: Conjunctivae are normal. No scleral icterus.  Neck: Normal range of motion.  Cardiovascular: Normal rate and regular rhythm.   Pulmonary/Chest: Effort normal and breath sounds normal. No respiratory distress. She has no wheezes.  Abdominal: Soft. Bowel sounds are normal. She exhibits no distension and no mass. There is tenderness (  RLQ and suprapubic region). There is no rebound and no guarding.  Genitourinary:  No CVAT  Musculoskeletal: Normal range of motion.  Neurological: She is alert.  Skin: Skin is warm and dry.    ED Course  Procedures (including critical care time)  Labs Reviewed  URINALYSIS, ROUTINE W REFLEX MICROSCOPIC - Abnormal; Notable for the following:    APPearance CLOUDY (*)    Hgb urine dipstick SMALL (*)    Leukocytes, UA MODERATE (*)    All other components within normal limits  URINE CULTURE  URINE MICROSCOPIC-ADD ON  POCT PREGNANCY, URINE   No results found.   1. UTI (lower urinary tract infection)       MDM  Pt c/o dysuria,  urinary frequency, and lower abdominal pressure pain after urination.  Pt believes she has a UTI.  Has tried ibuprofen and cranberry juice w/o relief.  Denies fever, vomiting, diarrhea, vaginal discharge, or hematuria.   PE: RLQ and suprapubic TTP. No CVAT.  UA: WBC TNT  Dx: UTI  Rx: macrobid and pyridium. F/u with primary care if sxs not improving, or worsening.     Vitals: unremarkable. Discharged in stable condition.        Junius Finner, PA-C 09/24/12 [phone]

## 2012-09-26 LAB — URINE CULTURE

## 2012-09-27 ENCOUNTER — Telehealth (HOSPITAL_COMMUNITY): Payer: Self-pay | Admitting: Emergency Medicine

## 2012-10-04 ENCOUNTER — Emergency Department (HOSPITAL_COMMUNITY)
Admission: EM | Admit: 2012-10-04 | Discharge: 2012-10-04 | Disposition: A | Discharge status: Home / Self Care | Payer: Medicaid Other | Attending: Emergency Medicine | Admitting: Emergency Medicine

## 2012-10-04 ENCOUNTER — Emergency Department (HOSPITAL_COMMUNITY): Payer: Medicaid Other

## 2012-10-04 ENCOUNTER — Encounter (HOSPITAL_COMMUNITY): Payer: Self-pay | Admitting: Emergency Medicine

## 2012-10-04 DIAGNOSIS — S5012XA Contusion of left forearm, initial encounter: Secondary | ICD-10-CM

## 2012-10-04 DIAGNOSIS — Z8669 Personal history of other diseases of the nervous system and sense organs: Secondary | ICD-10-CM | POA: Insufficient documentation

## 2012-10-04 DIAGNOSIS — Z87891 Personal history of nicotine dependence: Secondary | ICD-10-CM | POA: Insufficient documentation

## 2012-10-04 DIAGNOSIS — G5600 Carpal tunnel syndrome, unspecified upper limb: Secondary | ICD-10-CM | POA: Insufficient documentation

## 2012-10-04 DIAGNOSIS — W108XXA Fall (on) (from) other stairs and steps, initial encounter: Secondary | ICD-10-CM | POA: Insufficient documentation

## 2012-10-04 DIAGNOSIS — E669 Obesity, unspecified: Secondary | ICD-10-CM | POA: Insufficient documentation

## 2012-10-04 DIAGNOSIS — Y9289 Other specified places as the place of occurrence of the external cause: Secondary | ICD-10-CM | POA: Insufficient documentation

## 2012-10-04 DIAGNOSIS — S5010XA Contusion of unspecified forearm, initial encounter: Secondary | ICD-10-CM | POA: Insufficient documentation

## 2012-10-04 DIAGNOSIS — Y9389 Activity, other specified: Secondary | ICD-10-CM | POA: Insufficient documentation

## 2012-10-04 MED ORDER — TRAMADOL HCL 50 MG PO TABS: 50.0000 mg | ORAL_TABLET | Freq: Four times a day (QID) | ORAL | Status: DC | PRN

## 2012-10-04 NOTE — Discharge Instructions (Signed)
Bone Bruise   A bone bruise is a small hidden fracture of the bone. It typically occurs with bones located close to the surface of the skin.   SYMPTOMS   The pain lasts longer than a normal bruise.   The bruised area is difficult to use.   There may be discoloration or swelling of the bruised area.   When a bone bruise is found with injury to the anterior cruciate ligament (in the knee) there is often an increased:   Amount of fluid in the knee   Time the fluid in the knee lasts.   Number of days until you are walking normally and regaining the motion you had before the injury.   Number of days with pain from the injury.  DIAGNOSIS   It can only be seen on X-rays known as MRIs. This stands for magnetic resonance imaging. A regular X-ray taken of a bone bruise would appear to be normal. A bone bruise is a common injury in the knee and the heel bone (calcaneus). The problems are similar to those produced by stress fractures, which are bone injuries caused by overuse. A bone bruise may also be a sign of other injuries. For example, bone bruises are commonly found where an anterior cruciate ligament (ACL) in the knee has been pulled away from the bone (ruptured). A ligament is a tough fibrous material that connects bones together to make our joints stable. Bruises of the bone last a lot longer than bruises of the muscle or tissues beneath the skin. Bone bruises can last from days to months and are often more severe and painful than other bruises.   TREATMENT   Because bone bruises are sudden injuries you cannot often prevent them, other than by being extremely careful. Some things you can do to improve the condition are:   Apply ice to the sore area for 15 to 20 minutes, 3 to 4 times per day while awake for the first 2 days. Put the ice in a plastic bag, and place a towel between the bag of ice and your skin.   Keep your bruised area raised (elevated) when possible to lessen swelling.   For activity:   Use crutches when  necessary; do not put weight on the injured leg until you are no longer tender.   You may walk on your affected part as the pain allows, or as instructed.   Start weight bearing gradually on the bruised part.   Continue to use crutches or a cane until you can stand without causing pain, or as instructed.   If a plaster splint was applied, wear the splint until you are seen for a follow-up examination. Rest it on nothing harder than a pillow the first 24 hours. Do not put weight on it. Do not get it wet. You may take it off to take a shower or bath.   If an air splint was applied, more air may be blown into or out of the splint as needed for comfort. You may take it off at night and to take a shower or bath.   Wiggle your toes in the splint several times per day if you are able.   You may have been given an elastic bandage to use with the plaster splint or alone. The splint is too tight if you have numbness, tingling or if your foot becomes cold and blue. Adjust the bandage to make it comfortable.   Only take over-the-counter or prescription medicines   for pain, discomfort, or fever as directed by your caregiver.   Follow all instructions for follow up with your caregiver. This includes any orthopedic referrals, physical therapy, and rehabilitation. Any delay in obtaining necessary care could result in a delay or failure of the bones to heal.  SEEK MEDICAL CARE IF:   You have an increase in bruising, swelling, or pain.   You notice coldness of your toes.   You do not get pain relief with medications.  SEEK IMMEDIATE MEDICAL CARE IF:   Your toes are numb or blue.   You have severe pain not controlled with medications.   If any of the problems that caused you to seek care are becoming worse.  Document Released: 08/29/2003 Document Revised: 08/31/2011 Document Reviewed: 01/11/2008   [employer] Patient Information 2013 [employer].

## 2012-10-04 NOTE — ED Provider Notes (Signed)
History     CSN: [id number]  Arrival date & time 10/04/12  4098   First MD Initiated Contact with Patient 10/04/12 1841      Chief Complaint  Patient presents with  . Arm Pain    (Consider location/radiation/quality/duration/timing/severity/associated sxs/prior treatment) Patient is a 27 y.o. female presenting with arm pain. The history is provided by the patient.  Arm Pain This is a new problem. The current episode started today. Episode frequency: the pain is constant. The problem has been unchanged. Pertinent negatives include no chills, fever, headaches, nausea or vomiting. Exacerbated by: movement of forearm. She has tried NSAIDs for the symptoms. The treatment provided no relief.   Stacy [name] is a 27 y.o. female who presents to the ED with arm pain. The pain is located in the left forearm. She rates the pain as 10/10. She states she was going down the steps after the rain and slipped on water and fell on left arm. Denies any other injuries.   Past Medical History  Diagnosis Date  . Obesity   . Carpal tunnel syndrome   . Migraine     Past Surgical History  Procedure Laterality Date  . Cholecystectomy    . Cesarean section    . Tubal ligation    . Wisdom tooth extraction    . Cesarean section      Family History  Problem Relation Age of Onset  . Diabetes Mother   . Hypertension Father     History  Substance Use Topics  . Smoking status: Former Games developer  . Smokeless tobacco: Not on file  . Alcohol Use: No    OB History   Grav Para Term Preterm Abortions TAB SAB Ect Mult Living   2 2 2              Review of Systems  Constitutional: Negative for fever and chills.  Eyes: Negative for visual disturbance.  Gastrointestinal: Negative for nausea and vomiting.  Skin: Negative for wound.  Allergic/Immunologic: Negative for immunocompromised state.  Neurological: Negative for dizziness and headaches.  Psychiatric/Behavioral: Negative for confusion. The patient  is not nervous/anxious.     Allergies  Coconut flavor; Pineapple; and Vicodin  Home Medications   Current Outpatient Rx  Name  Route  Sig  Dispense  Refill  . ibuprofen (ADVIL,MOTRIN) 200 MG tablet   Oral   Take 400 mg by mouth once as needed for pain.           BP 111/78  Pulse 61  Temp(Src) 98 F (36.7 C) (Oral)  Resp 18  SpO2 98%  LMP 10/01/2012  Physical Exam  Nursing note and vitals reviewed. Constitutional: She is oriented to person, place, and time. She appears well-developed and well-nourished.  HENT:  Head: Atraumatic.  Eyes: EOM are normal.  Neck: Normal range of motion. Neck supple.  Cardiovascular: Normal rate.   Pulmonary/Chest: Effort normal.  Musculoskeletal:       Left forearm: She exhibits tenderness. She exhibits no swelling, no deformity and no laceration.       Arms: Radial pulse strong, adequate circulation, good touch sensation. Passive flexion and extension of elbow without difficulty or pain.   Neurological: She is alert and oriented to person, place, and time. No cranial nerve deficit.  Skin: Skin is warm and dry.  Psychiatric: She has a normal mood and affect. Her behavior is normal. Judgment and thought content normal.    ED Course  Procedures (including critical care time)  Labs  Reviewed - No data to display Dg Forearm Left  10/04/2012  *RADIOLOGY REPORT*  Clinical Data: Forearm pain post fall  LEFT FOREARM - 2 VIEW  Comparison: All  Findings: Two views of the left forearm submitted.  No acute fracture or subluxation.  No radiopaque foreign body.  IMPRESSION: No acute fracture or subluxation.   Original Report Authenticated By: Natasha Mead, M.D.    Assessment: 27 y.o. female with left forearm pain s/p fall   Contusion to left forearm  Plan:  Sling, ice, elevate, pain management, follow up with Dr. Hilda Lias   MDM  I have reviewed this patient's vital signs, nurses notes, appropriate imaging and discussed findings with the patient and  plan of care. Patient voices understanding.    Medication List    TAKE these medications       traMADol 50 MG tablet  Commonly known as:  ULTRAM  Take 1 tablet (50 mg total) by mouth every 6 (six) hours as needed for pain.      ASK your doctor about these medications       ibuprofen 200 MG tablet  Commonly known as:  ADVIL,MOTRIN  Take 400 mg by mouth once as needed for pain.                 [facility] Orlene Och, [place] 10/04/12 1929

## 2012-10-04 NOTE — ED Notes (Signed)
Pt c/o left arm pain after falling down steps earlier this afternoon. Pt reports increased pain with movement. No deformity.

## 2012-10-05 NOTE — ED Provider Notes (Signed)
Medical screening examination/treatment/procedure(s) were performed by non-physician practitioner and as supervising physician I was immediately available for consultation/collaboration.   Shelda Jakes, MD 10/05/12 [phone]

## 2012-10-14 ENCOUNTER — Encounter: Payer: Self-pay | Admitting: Physician Assistant

## 2012-10-14 ENCOUNTER — Ambulatory Visit (INDEPENDENT_AMBULATORY_CARE_PROVIDER_SITE_OTHER): Payer: Medicaid Other | Admitting: Physician Assistant

## 2012-10-14 VITALS — BP 114/74 | HR 56 | Temp 97.2°F | Resp 18 | Ht 65.0 in | Wt 224.0 lb

## 2012-10-14 DIAGNOSIS — N390 Urinary tract infection, site not specified: Secondary | ICD-10-CM

## 2012-10-14 DIAGNOSIS — R350 Frequency of micturition: Secondary | ICD-10-CM

## 2012-10-14 LAB — URINALYSIS, MICROSCOPIC ONLY
Casts: NONE SEEN
Crystals: NONE SEEN

## 2012-10-14 LAB — URINALYSIS, ROUTINE W REFLEX MICROSCOPIC
Bilirubin Urine: NEGATIVE
Glucose, UA: NEGATIVE mg/dL
Ketones, ur: NEGATIVE mg/dL
Specific Gravity, Urine: 1.025 (ref 1.005–1.030)
pH: 7 (ref 5.0–8.0)

## 2012-10-14 MED ORDER — SULFAMETHOXAZOLE-TRIMETHOPRIM 800-160 MG PO TABS: 1.0000 | ORAL_TABLET | Freq: Two times a day (BID) | ORAL | Status: DC

## 2012-10-14 NOTE — Progress Notes (Signed)
Patient ID: IKEA DEMICCO MRN: [id number], DOB: 21-Oct-1985, 27 y.o. Date of Encounter: 10/14/2012, 11:24 AM    Chief Complaint:  Chief Complaint  Patient presents with  . C/O POSS UTI    tx 1 mth ago  seems to have returned  alot of pressure and frequency     HPI: 27 y.o. year old female says she had UTI about 1 month ago- was weekend so went to ER. Took abx as directed.  Now has same type symptoms. Feels pressure in suprapubic area when she urinates. Has urinary frequency-goes very frequently, but very small amount of urine. No dysuria, urgency. No fever/chills. But says this feels the same as symptoms she had with recent UTI.      Home Meds: Current Outpatient Prescriptions on File Prior to Visit  Medication Sig Dispense Refill  . ibuprofen (ADVIL,MOTRIN) 200 MG tablet Take 400 mg by mouth once as needed for pain.      . traMADol (ULTRAM) 50 MG tablet Take 1 tablet (50 mg total) by mouth every 6 (six) hours as needed for pain.  15 tablet  0   No current facility-administered medications on file prior to visit.    Allergies:  Allergies  Allergen Reactions  . Coconut Flavor Other (See Comments)    Mouth/throat lesions   . Pineapple Other (See Comments)    Mouth/throat leasions  . Vicodin (Hydrocodone-Acetaminophen) Other (See Comments)    Shaking/Rash      Review of Systems: see HPI for pertinent ros. All others negative   Physical Exam: Blood pressure 114/74, pulse 56, temperature 97.2 F (36.2 C), temperature source Oral, resp. rate 18, height 5\' 5"  (1.651 m), weight 224 lb (101.606 kg), last menstrual period 10/01/2012., Body mass index is 37.28 kg/(m^2). General: obese WF in no acute distress. Lungs: Clear bilaterally to auscultation without wheezes, rales, or rhonchi. Breathing is unlabored. Heart: Regular rhythm. No murmurs, rubs, or gallops. Abdomen: Soft, non-tender, non-distended with normoactive bowel sounds. No hepatomegaly. No rebound/guarding. No obvious  abdominal masses. Msk:  Strength and tone normal for age. Extremities/Skin: Warm and dry. No clubbing or cyanosis. No edema. No rashes or suspicious lesions. Neuro: Alert and oriented X 3. Moves all extremities spontaneously. Gait is normal. CNII-XII grossly in tact. Psych:  Responds to questions appropriately with a normal affect.   Results for orders placed in visit on 10/14/12  URINALYSIS, ROUTINE W REFLEX MICROSCOPIC      Result Value Range   Color, Urine YELLOW  YELLOW   APPearance CLOUDY (*) CLEAR   Specific Gravity, Urine 1.025  1.005 - 1.030   pH 7.0  5.0 - 8.0   Glucose, UA NEG  NEG mg/dL   Bilirubin Urine NEG  NEG   Ketones, ur NEG  NEG mg/dL   Hgb urine dipstick SMALL (*) NEG   Protein, ur 30 (*) NEG mg/dL   Urobilinogen, UA 1  0.0 - 1.0 mg/dL   Nitrite NEG  NEG   Leukocytes, UA SMALL (*) NEG  URINALYSIS, MICROSCOPIC ONLY      Result Value Range   Squamous Epithelial / LPF RARE  RARE   Crystals NONE SEEN  NONE SEEN   Casts NONE SEEN  NONE SEEN   WBC, UA 11-20 (*) <3 WBC/hpf   RBC / HPF 7-10 (*) <3 RBC/hpf   Bacteria, UA MANY (*) RARE     ASSESSMENT AND PLAN:  27 y.o. year old female with  1. UTI (urinary tract infection)  - sulfamethoxazole-trimethoprim (  BACTRIM DS,SEPTRA DS) 800-160 MG per tablet; Take 1 tablet by mouth 2 (two) times daily.  Dispense: 6 tablet; Refill: 0  2. Frequency of urination  - Urinalysis, Routine w reflex microscopic   Signed, [address] [place], [place], [facility] 10/14/2012 11:24 AM

## 2012-11-25 ENCOUNTER — Encounter (HOSPITAL_COMMUNITY): Payer: Self-pay | Admitting: *Deleted

## 2012-11-25 DIAGNOSIS — E669 Obesity, unspecified: Secondary | ICD-10-CM | POA: Insufficient documentation

## 2012-11-25 DIAGNOSIS — Z87891 Personal history of nicotine dependence: Secondary | ICD-10-CM | POA: Insufficient documentation

## 2012-11-25 DIAGNOSIS — R51 Headache: Secondary | ICD-10-CM | POA: Insufficient documentation

## 2012-11-25 DIAGNOSIS — G5600 Carpal tunnel syndrome, unspecified upper limb: Secondary | ICD-10-CM | POA: Insufficient documentation

## 2012-11-25 NOTE — ED Notes (Signed)
The pt has had a headache for 4 days with nv.  History of the same

## 2012-11-26 ENCOUNTER — Emergency Department (HOSPITAL_COMMUNITY)
Admission: EM | Admit: 2012-11-26 | Discharge: 2012-11-26 | Disposition: A | Discharge status: Home / Self Care | Payer: Medicaid Other | Attending: Emergency Medicine | Admitting: Emergency Medicine

## 2012-11-26 MED ORDER — METOCLOPRAMIDE HCL 5 MG/ML IJ SOLN
10.0000 mg | Freq: Once | INTRAMUSCULAR | Status: AC
  Administered 2012-11-26: 10 mg via INTRAVENOUS
  Filled 2012-11-26: qty 2

## 2012-11-26 MED ORDER — DIPHENHYDRAMINE HCL 50 MG/ML IJ SOLN
25.0000 mg | Freq: Once | INTRAMUSCULAR | Status: AC
  Administered 2012-11-26: 25 mg via INTRAVENOUS
  Filled 2012-11-26: qty 1

## 2012-11-26 NOTE — ED Notes (Signed)
Pt stated that she has been having a headache x4 days. She stated that she was having slight blurred vision in right eye. Headache in frontal area. No additional neurological deficits.

## 2012-11-26 NOTE — ED Provider Notes (Signed)
History     CSN: [id number]  Arrival date & time 11/25/12  2324   First MD Initiated Contact with Patient 11/26/12 0057      Chief Complaint  Patient presents with  . Headache    Patient is a 27 y.o. female presenting with headaches. The history is provided by the patient.  Headache Pain location:  Frontal Quality:  Dull Radiates to:  Does not radiate Onset quality:  Gradual Duration:  4 days Timing:  Intermittent Progression:  Worsening Similar to prior headaches: yes   Relieved by:  Nothing Worsened by:  Light Associated symptoms: blurred vision   Associated symptoms: no abdominal pain, no fever, no loss of balance, no seizures, no syncope, no vomiting and no weakness    Pt reports gradual onset of HA 4 days ago No recent head trauma No fever No rash and no tick bite No focal weakness She reports h/o headaches but none recently  Past Medical History  Diagnosis Date  . Obesity   . Carpal tunnel syndrome   . Migraine     Past Surgical History  Procedure Laterality Date  . Cholecystectomy    . Cesarean section    . Tubal ligation    . Wisdom tooth extraction    . Cesarean section      Family History  Problem Relation Age of Onset  . Diabetes Mother   . Hypertension Father     History  Substance Use Topics  . Smoking status: Former Games developer  . Smokeless tobacco: Not on file  . Alcohol Use: No    OB History   Grav Para Term Preterm Abortions TAB SAB Ect Mult Living   2 2 2              Review of Systems  Constitutional: Negative for fever.  Eyes: Positive for blurred vision.  Respiratory: Negative for shortness of breath.   Cardiovascular: Negative for chest pain and syncope.  Gastrointestinal: Negative for vomiting and abdominal pain.  Skin: Negative for rash.  Neurological: Positive for headaches. Negative for seizures, syncope, speech difficulty, weakness and loss of balance.  All other systems reviewed and are negative.    Allergies   Coconut flavor; Pineapple; and Vicodin  Home Medications   Current Outpatient Rx  Name  Route  Sig  Dispense  Refill  . acetaminophen (TYLENOL) 325 MG tablet   Oral   Take by mouth every 4 (four) hours as needed for pain.         Marland Kitchen aspirin-acetaminophen-caffeine (EXCEDRIN MIGRAINE) 250-250-65 MG per tablet   Oral   Take 2 tablets by mouth once.         Marland Kitchen ibuprofen (ADVIL,MOTRIN) 200 MG tablet   Oral   Take 800 mg by mouth every 6 (six) hours as needed for pain.            BP 122/73  Pulse 64  Temp(Src) 97.8 F (36.6 C) (Oral)  Resp 18  SpO2 99%  Physical Exam CONSTITUTIONAL: Well developed/well nourished HEAD: Normocephalic/atraumatic EYES: EOMI/PERRL, no nystagmus, normal fundoscopic exam  ENMT: Mucous membranes moist NECK: supple no meningeal signs, no bruits SPINE:entire spine nontender CV: S1/S2 noted, no murmurs/rubs/gallops noted LUNGS: Lungs are clear to auscultation bilaterally, no apparent distress ABDOMEN: soft, nontender, no rebound or guarding GU:no cva tenderness NEURO:Awake/alert, facies symmetric, no arm or leg drift is noted Cranial nerves 3/4/5/6/12/28/08/11/12 tested and intact Gait normal without ataxia No past pointing EXTREMITIES: pulses normal, full ROM SKIN: warm, color normal  PSYCH: no abnormalities of mood noted   ED Course  Procedures    1:15 AM Pt well appearing, no distress.  No neuro deficits, she is ambulatory Will treat headache.  Doubt SAH.  I doubt acute neurologic event at this time  2:07 AM Pt improved and she is requesting d/c home MDM  Nursing notes including past medical history and social history reviewed and considered in documentation         Joya Gaskins, MD 11/26/12 0207

## 2012-12-01 ENCOUNTER — Telehealth: Payer: Self-pay | Admitting: Family Medicine

## 2012-12-01 MED ORDER — VALACYCLOVIR HCL 500 MG PO TABS: 500.0000 mg | ORAL_TABLET | Freq: Two times a day (BID) | ORAL | Status: DC

## 2012-12-01 NOTE — Telephone Encounter (Signed)
Rx Refilled  

## 2012-12-01 NOTE — Telephone Encounter (Signed)
ok 

## 2012-12-01 NOTE — Telephone Encounter (Signed)
OK to do-

## 2012-12-06 ENCOUNTER — Telehealth: Payer: Self-pay | Admitting: Family Medicine

## 2012-12-06 NOTE — Telephone Encounter (Signed)
No where in pt chart did I see that she was receiving regular Valtrex.  We have the occasional short dose orders.  Nothing on refill list that shows any regular refills.  Not written on any med lists either.

## 2012-12-06 NOTE — Telephone Encounter (Signed)
Patient can have a refill on her regular dose.  Please verify what she was taking and refill it.

## 2012-12-06 NOTE — Telephone Encounter (Signed)
Begin valtrex 500 mg poqday for prophylaxis

## 2012-12-07 MED ORDER — VALACYCLOVIR HCL 500 MG PO TABS: 500.0000 mg | ORAL_TABLET | Freq: Every day | ORAL | Status: DC

## 2012-12-07 NOTE — Telephone Encounter (Signed)
Pt called made aware of new RX.  Rx sent to pharmacy

## 2012-12-15 IMAGING — CR DG KNEE COMPLETE 4+V*L*
4 series · 4 of 4 positions shown · non-contrast
Comparison: 05/24/2011

CLINICAL DATA: Anterior knee pain secondary to a fall today.

LEFT KNEE - COMPLETE 4+ VIEW

[view not recorded (1 of 4)]
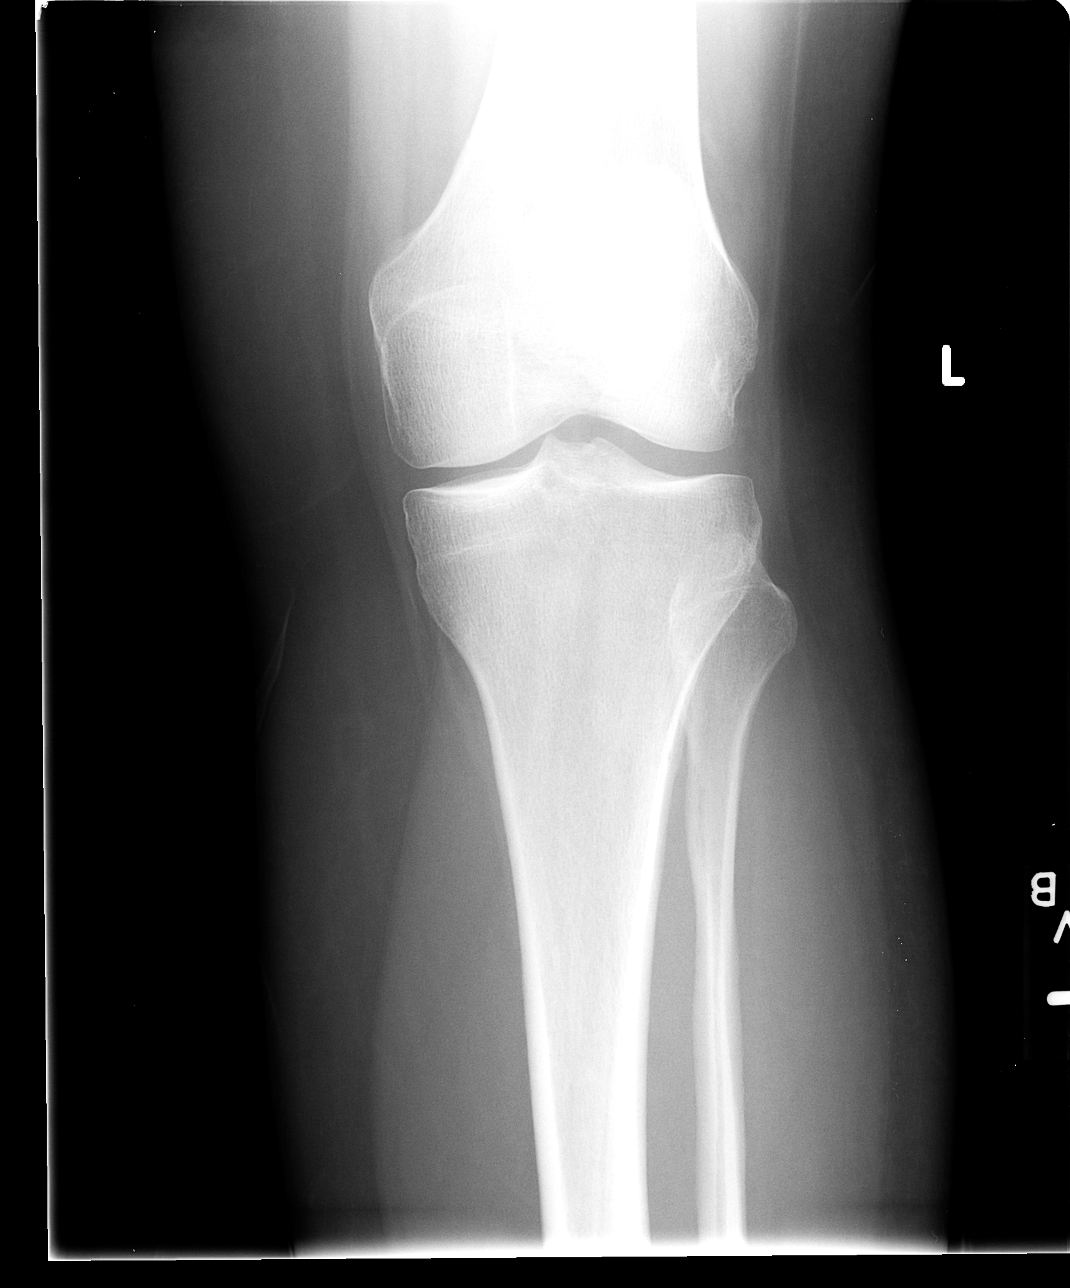

[view not recorded (2 of 4)]
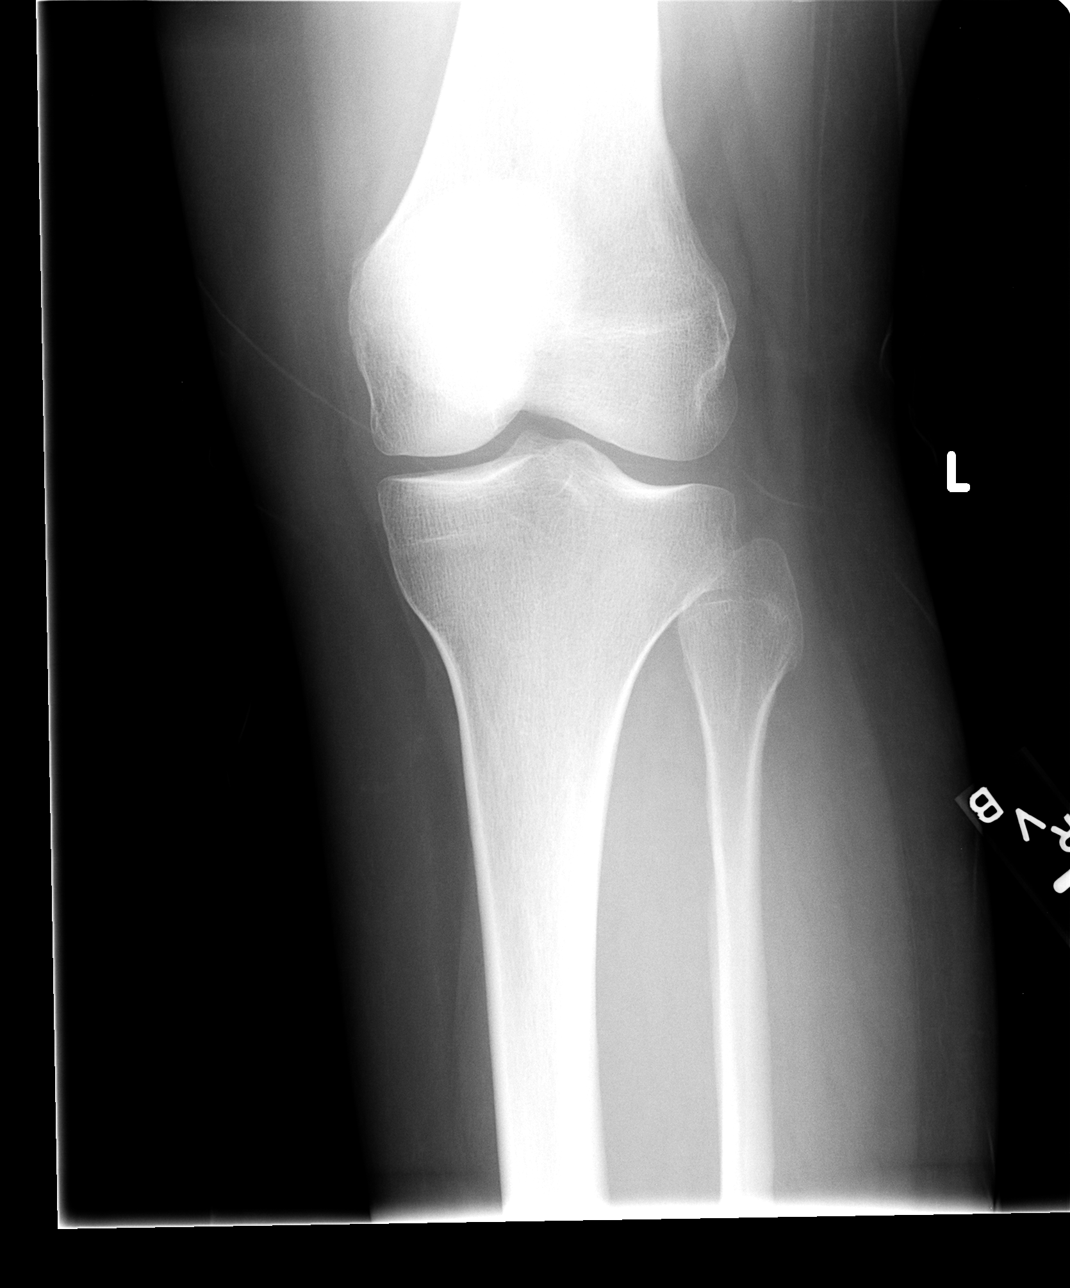

[view not recorded (3 of 4)]
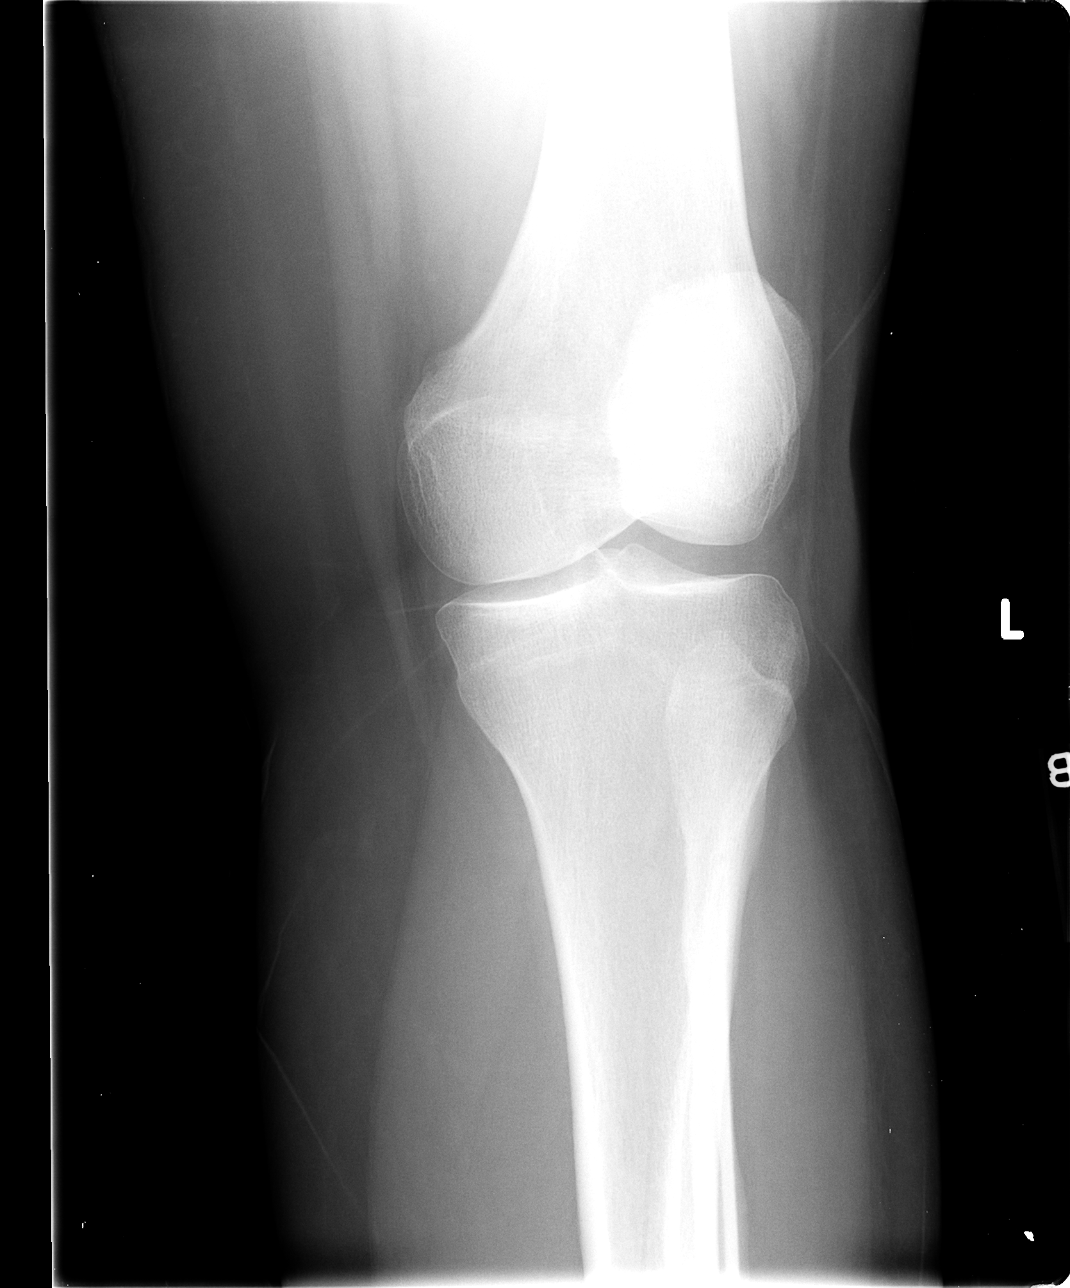

[view not recorded (4 of 4)]
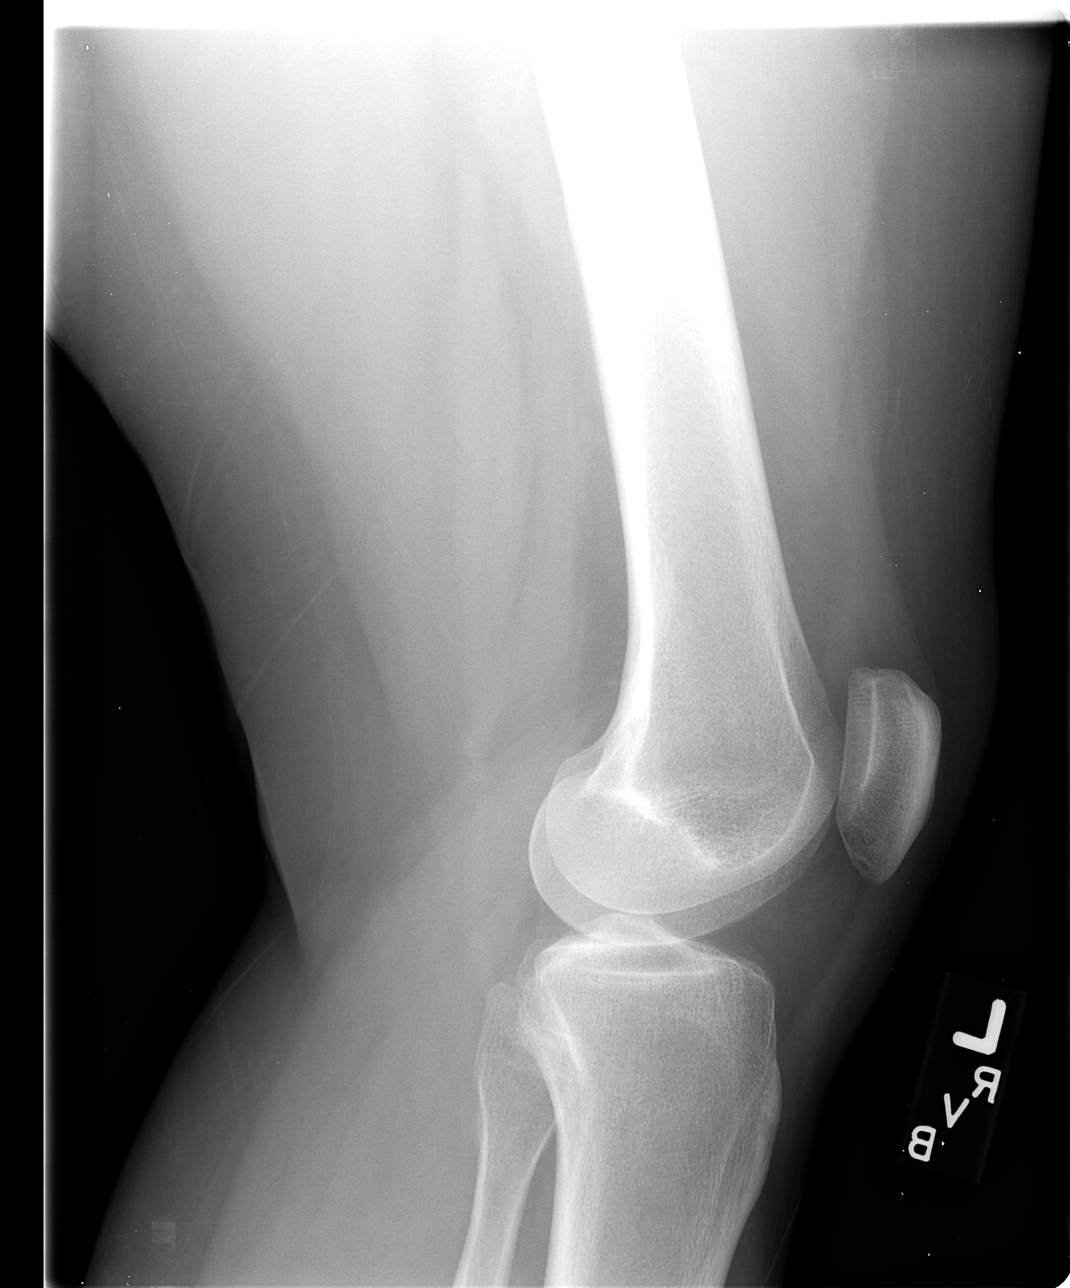

[4 of 4 positions shown; findings below may reference images not displayed]

FINDINGS: There is no fracture, dislocation, or joint effusion.
IMPRESSION: Normal exam.

## 2012-12-23 ENCOUNTER — Emergency Department (HOSPITAL_COMMUNITY)
Admission: EM | Admit: 2012-12-23 | Discharge: 2012-12-23 | Disposition: A | Discharge status: Home / Self Care | Payer: Medicaid Other | Attending: Emergency Medicine | Admitting: Emergency Medicine

## 2012-12-23 ENCOUNTER — Encounter (HOSPITAL_COMMUNITY): Payer: Self-pay

## 2012-12-23 DIAGNOSIS — G43909 Migraine, unspecified, not intractable, without status migrainosus: Secondary | ICD-10-CM | POA: Insufficient documentation

## 2012-12-23 DIAGNOSIS — R350 Frequency of micturition: Secondary | ICD-10-CM | POA: Insufficient documentation

## 2012-12-23 DIAGNOSIS — R110 Nausea: Secondary | ICD-10-CM | POA: Insufficient documentation

## 2012-12-23 DIAGNOSIS — Z8669 Personal history of other diseases of the nervous system and sense organs: Secondary | ICD-10-CM | POA: Insufficient documentation

## 2012-12-23 DIAGNOSIS — Z3202 Encounter for pregnancy test, result negative: Secondary | ICD-10-CM | POA: Insufficient documentation

## 2012-12-23 DIAGNOSIS — Z79899 Other long term (current) drug therapy: Secondary | ICD-10-CM | POA: Insufficient documentation

## 2012-12-23 DIAGNOSIS — E669 Obesity, unspecified: Secondary | ICD-10-CM | POA: Insufficient documentation

## 2012-12-23 DIAGNOSIS — Z87891 Personal history of nicotine dependence: Secondary | ICD-10-CM | POA: Insufficient documentation

## 2012-12-23 DIAGNOSIS — N390 Urinary tract infection, site not specified: Secondary | ICD-10-CM | POA: Insufficient documentation

## 2012-12-23 LAB — URINALYSIS, ROUTINE W REFLEX MICROSCOPIC
Bilirubin Urine: NEGATIVE
Nitrite: NEGATIVE
Specific Gravity, Urine: >1.03 — ABNORMAL HIGH (ref 1.005–1.030)
Urobilinogen, UA: 0.2 mg/dL (ref 0.0–1.0)
pH: 5.5 (ref 5.0–8.0)

## 2012-12-23 LAB — URINE MICROSCOPIC-ADD ON

## 2012-12-23 MED ORDER — CEPHALEXIN 500 MG PO CAPS: 500.0000 mg | ORAL_CAPSULE | Freq: Four times a day (QID) | ORAL | Status: DC

## 2012-12-23 MED ORDER — PHENAZOPYRIDINE HCL 100 MG PO TABS
200.0000 mg | ORAL_TABLET | Freq: Once | ORAL | Status: AC
  Administered 2012-12-23: 200 mg via ORAL
  Filled 2012-12-23: qty 2

## 2012-12-23 MED ORDER — PHENAZOPYRIDINE HCL 200 MG PO TABS: 200.0000 mg | ORAL_TABLET | Freq: Three times a day (TID) | ORAL | Status: DC

## 2012-12-23 MED ORDER — CEPHALEXIN 500 MG PO CAPS
1000.0000 mg | ORAL_CAPSULE | Freq: Once | ORAL | Status: AC
  Administered 2012-12-23: 1000 mg via ORAL
  Filled 2012-12-23: qty 2

## 2012-12-23 NOTE — ED Provider Notes (Addendum)
History  This chart was scribed for Stacy Co, MD by Bennett Scrape, ED Scribe. This patient was seen in room APA08/APA08 and the patient's care was started at 8:35 AM.  CSN: [id number]  Arrival date & time 12/23/12  0829   First MD Initiated Contact with Patient 12/23/12 [phone]     Chief Complaint  Patient presents with  . Urinary Tract Infection    The history is provided by the patient. No language interpreter was used.   HPI Comments: Stacy [name] is a 27 y.o. female who presents to the Emergency Department complaining of dysuria described as burning pressure with associated nausea and frequency that she woke up with this morning. She denies having any symptoms yesterday. She has a h/o UTIs and reports similarities. She reports taking one 800 mg ibuprofen with mild improvement. She denies fever, chills, vaginal odor, vaginal discharge, abdominal pain and hematuria as associated symptoms. No flank pain. Nothing improves or worsens her pain. Her pain is mild/moderate   Past Medical History  Diagnosis Date  . Obesity   . Carpal tunnel syndrome   . Migraine    Past Surgical History  Procedure Laterality Date  . Cholecystectomy    . Cesarean section    . Tubal ligation    . Wisdom tooth extraction    . Cesarean section     Family History  Problem Relation Age of Onset  . Diabetes Mother   . Hypertension Father    History  Substance Use Topics  . Smoking status: Former Games developer  . Smokeless tobacco: Not on file  . Alcohol Use: No   OB History   Grav Para Term Preterm Abortions TAB SAB Ect Mult Living   2 2 2             Review of Systems  A complete 10 system review of systems was obtained and all systems are negative except as noted in the HPI and PMH.   Allergies  Coconut flavor; Pineapple; and Vicodin  Home Medications   Current Outpatient Rx  Name  Route  Sig  Dispense  Refill  . acetaminophen (TYLENOL) 325 MG tablet   Oral   Take by mouth every 4  (four) hours as needed for pain.         Marland Kitchen aspirin-acetaminophen-caffeine (EXCEDRIN MIGRAINE) 250-250-65 MG per tablet   Oral   Take 2 tablets by mouth once.         Marland Kitchen ibuprofen (ADVIL,MOTRIN) 200 MG tablet   Oral   Take 800 mg by mouth every 6 (six) hours as needed for pain.          . valACYclovir (VALTREX) 500 MG tablet   Oral   Take 1 tablet (500 mg total) by mouth daily.   30 tablet   5    Triage Vitals: BP 104/64  Pulse 77  Temp(Src) 97.8 F (36.6 C) (Oral)  Resp 18  Ht 5\' 7"  (1.702 m)  Wt 230 lb (104.327 kg)  BMI 36.01 kg/m2  SpO2 99%  LMP 11/30/2012  Physical Exam  Nursing note and vitals reviewed. Constitutional: She is oriented to person, place, and time. She appears well-developed and well-nourished. No distress.  HENT:  Head: Normocephalic and atraumatic.  Eyes: EOM are normal.  Neck: Normal range of motion.  Cardiovascular: Normal rate, regular rhythm and normal heart sounds.   Pulmonary/Chest: Effort normal and breath sounds normal.  Abdominal: Soft. She exhibits no distension. There is no tenderness.  Musculoskeletal: Normal  range of motion.  Neurological: She is alert and oriented to person, place, and time.  Skin: Skin is warm and dry.  Psychiatric: She has a normal mood and affect. Judgment normal.    ED Course  Procedures (including critical care time)  DIAGNOSTIC STUDIES: Oxygen Saturation is 99% on room air, normal by my interpretation.    COORDINATION OF CARE: 8:59 AM-Discussed discharge plan which includes Pyridium with pt and pt agreed to plan. Also advised pt to take ibuprofen as needed and pt agreed. .   Labs Reviewed  URINALYSIS, ROUTINE W REFLEX MICROSCOPIC - Abnormal; Notable for the following:    Specific Gravity, Urine >1.030 (*)    Hgb urine dipstick SMALL (*)    Ketones, ur TRACE (*)    Leukocytes, UA SMALL (*)    All other components within normal limits  URINE MICROSCOPIC-ADD ON - Abnormal; Notable for the following:     Bacteria, UA FEW (*)    All other components within normal limits  URINE CULTURE  PREGNANCY, URINE   No results found. 1. Urinary tract infection     MDM  This appears to be a urinary tract infection.  Normal vital signs.  No signs of pyelonephritis.  Doubt ureteral stone.  Discharge home with antibiotics and pyridium.  She understands to return to the ER for new or worsening symptoms.  Suspect hemorrhagic cystitis  I personally performed the services described in this documentation, which was scribed in my presence. The recorded information has been reviewed and is accurate.     Stacy Co, MD 12/23/12 0981  Stacy Co, MD 12/23/12 [phone]

## 2012-12-23 NOTE — ED Notes (Signed)
Pt reports waking this am, and thinks she has a uti, having freq. Urination, "lots of pressure", denies any blood in her urine. Denies any vaginal discharge, had uti 2 months ago and symptoms are the same. No fever.

## 2012-12-25 LAB — URINE CULTURE

## 2012-12-26 NOTE — ED Notes (Signed)
+   Urine Patient treated with Cephalexin-sensitive to same-chart appended per protocol MD. 

## 2012-12-28 ENCOUNTER — Emergency Department (HOSPITAL_COMMUNITY)
Admission: EM | Admit: 2012-12-28 | Discharge: 2012-12-28 | Disposition: A | Discharge status: Home / Self Care | Payer: Medicaid Other | Attending: Emergency Medicine | Admitting: Emergency Medicine

## 2012-12-28 ENCOUNTER — Encounter (HOSPITAL_COMMUNITY): Payer: Self-pay

## 2012-12-28 DIAGNOSIS — E669 Obesity, unspecified: Secondary | ICD-10-CM | POA: Insufficient documentation

## 2012-12-28 DIAGNOSIS — Z87891 Personal history of nicotine dependence: Secondary | ICD-10-CM | POA: Insufficient documentation

## 2012-12-28 DIAGNOSIS — G43909 Migraine, unspecified, not intractable, without status migrainosus: Secondary | ICD-10-CM | POA: Insufficient documentation

## 2012-12-28 DIAGNOSIS — Z8669 Personal history of other diseases of the nervous system and sense organs: Secondary | ICD-10-CM | POA: Insufficient documentation

## 2012-12-28 DIAGNOSIS — Z79899 Other long term (current) drug therapy: Secondary | ICD-10-CM | POA: Insufficient documentation

## 2012-12-28 DIAGNOSIS — R0789 Other chest pain: Secondary | ICD-10-CM

## 2012-12-28 DIAGNOSIS — R071 Chest pain on breathing: Secondary | ICD-10-CM | POA: Insufficient documentation

## 2012-12-28 LAB — BASIC METABOLIC PANEL
BUN: 8 mg/dL (ref 6–23)
Calcium: 9 mg/dL (ref 8.4–10.5)
Creatinine, Ser: 0.66 mg/dL (ref 0.50–1.10)
GFR calc Af Amer: >90 mL/min (ref >90)

## 2012-12-28 LAB — CBC WITH DIFFERENTIAL/PLATELET
Basophils Absolute: 0 10*3/uL (ref 0.0–0.1)
Basophils Relative: 0 % (ref 0–1)
Eosinophils Absolute: 0.2 10*3/uL (ref 0.0–0.7)
Eosinophils Relative: 3 % (ref 0–5)
HCT: 36.3 % (ref 36.0–46.0)
MCH: 27.3 pg (ref 26.0–34.0)
MCHC: 34.2 g/dL (ref 30.0–36.0)
MCV: 79.8 fL (ref 78.0–100.0)
Monocytes Absolute: 0.3 10*3/uL (ref 0.1–1.0)
Monocytes Relative: 5 % (ref 3–12)
RDW: 13.3 % (ref 11.5–15.5)

## 2012-12-28 LAB — TROPONIN I: Troponin I: <0.3 ng/mL (ref <0.30)

## 2012-12-28 MED ORDER — TRAMADOL HCL 50 MG PO TABS: 50.0000 mg | ORAL_TABLET | Freq: Four times a day (QID) | ORAL | Status: DC | PRN

## 2012-12-28 MED ORDER — KETOROLAC TROMETHAMINE 30 MG/ML IJ SOLN
30.0000 mg | Freq: Once | INTRAMUSCULAR | Status: AC
  Administered 2012-12-28: 30 mg via INTRAMUSCULAR

## 2012-12-28 MED ORDER — KETOROLAC TROMETHAMINE 30 MG/ML IJ SOLN
30.0000 mg | Freq: Once | INTRAMUSCULAR | Status: DC
  Filled 2012-12-28: qty 1

## 2012-12-28 NOTE — ED Provider Notes (Signed)
History    This chart was scribed for Stacy Crease, MD by Quintella Reichert, ED scribe.  This patient was seen in room APA14/APA14 and the patient's care was started at 11:47 AM.  CSN: [id number]  Arrival date & time 12/28/12  1114    Chief Complaint  Patient presents with  . Chest Pain     The history is provided by the patient. No language interpreter was used.    HPI Comments: Stacy [name] is a 27 y.o. female who presents to the Emergency Department complaining of constant, moderate, left-sided stabbing CP that began when she woke up this morning.  Pain is exacerbated by touching the area and by deep breathing.  Pt denies recent injuries that may have brought on pain.  She denies fever, nausea, emesis or any other associated symptoms.  She denies h/o cardiac problems to her knowledge.    Past Medical History  Diagnosis Date  . Obesity   . Carpal tunnel syndrome   . Migraine    Past Surgical History  Procedure Laterality Date  . Cholecystectomy    . Cesarean section    . Tubal ligation    . Wisdom tooth extraction    . Cesarean section     Family History  Problem Relation Age of Onset  . Diabetes Mother   . Hypertension Father    History  Substance Use Topics  . Smoking status: Former Games developer  . Smokeless tobacco: Not on file  . Alcohol Use: No   OB History   Grav Para Term Preterm Abortions TAB SAB Ect Mult Living   2 2 2               Review of Systems  Constitutional: Negative for fever.  Cardiovascular: Positive for chest pain.  Gastrointestinal: Negative for nausea and vomiting.  All other systems reviewed and are negative.      Allergies  Coconut flavor; Pineapple; and Vicodin  Home Medications   Current Outpatient Rx  Name  Route  Sig  Dispense  Refill  . acetaminophen (TYLENOL) 325 MG tablet   Oral   Take 325 mg by mouth every 4 (four) hours as needed for pain.          Marland Kitchen aspirin-acetaminophen-caffeine (EXCEDRIN MIGRAINE)  250-250-65 MG per tablet   Oral   Take 2 tablets by mouth daily as needed.          . cephALEXin (KEFLEX) 500 MG capsule   Oral   Take 1 capsule (500 mg total) by mouth 4 (four) times daily.   20 capsule   0   . ibuprofen (ADVIL,MOTRIN) 200 MG tablet   Oral   Take 800 mg by mouth every 6 (six) hours as needed for pain.          . phenazopyridine (PYRIDIUM) 200 MG tablet   Oral   Take 1 tablet (200 mg total) by mouth 3 (three) times daily.   6 tablet   0    BP 128/69  Pulse 66  Temp(Src) 97.6 F (36.4 C) (Oral)  Resp 18  Ht 5\' 7"  (1.702 m)  Wt 240 lb (108.863 kg)  BMI 37.58 kg/m2  SpO2 97%  LMP 12/24/2012  Physical Exam  Nursing note and vitals reviewed. Constitutional: She is oriented to person, place, and time. She appears well-developed and well-nourished. No distress.  HENT:  Head: Normocephalic and atraumatic.  Right Ear: Hearing normal.  Left Ear: Hearing normal.  Nose: Nose normal.  Mouth/Throat:  Oropharynx is clear and moist and mucous membranes are normal.  Eyes: Conjunctivae and EOM are normal. Pupils are equal, round, and reactive to light.  Neck: Normal range of motion. Neck supple.  Cardiovascular: Regular rhythm, S1 normal and S2 normal.  Exam reveals no gallop and no friction rub.   No murmur heard. Pulmonary/Chest: Effort normal and breath sounds normal. No respiratory distress. She exhibits tenderness (Left upper chest).  Abdominal: Soft. Normal appearance and bowel sounds are normal. There is no hepatosplenomegaly. There is no tenderness. There is no rebound, no guarding, no tenderness at McBurney's point and negative Murphy's sign. No hernia.  Musculoskeletal: Normal range of motion.  Neurological: She is alert and oriented to person, place, and time. She has normal strength. No cranial nerve deficit or sensory deficit. Coordination normal. GCS eye subscore is 4. GCS verbal subscore is 5. GCS motor subscore is 6.  Skin: Skin is warm, dry and  intact. No rash noted. No cyanosis.  Psychiatric: She has a normal mood and affect. Her speech is normal and behavior is normal. Thought content normal.    ED Course  Procedures (including critical care time)  EKG:  Date: 12/28/2012  Rate: 50  Rhythm: normal sinus rhythm  QRS Axis: normal  Intervals: normal  ST/T Wave abnormalities: normal  Conduction Disutrbances:none  Narrative Interpretation:   Old EKG Reviewed: none available    DIAGNOSTIC STUDIES: Oxygen Saturation is 97% on room air, normal by my interpretation.    COORDINATION OF CARE: 11:49 AM-Discussed treatment plan which includes EKG and labs with pt at bedside and pt agreed to plan.     Labs Reviewed  CBC WITH DIFFERENTIAL  BASIC METABOLIC PANEL  TROPONIN I  D-DIMER, QUANTITATIVE   No results found.  Diagnosis: Chest wall pain  MDM  Patient presents to ER with complaints of left-sided chest pain. Patient has point tenderness in the left upper chest wall area. Pain is very reproducible with touch as well as range of motion. Symptoms are extremely atypical for acute coronary syndrome. Vital signs are stable. EKG was normal. Troponin negative. D-dimer also negative. Patient perc/Wells negative. No concern for PE.   I personally performed the services described in this documentation, which was scribed in my presence. The recorded information has been reviewed and is accurate.    Stacy Crease, MD 12/28/12 [phone]

## 2012-12-28 NOTE — ED Notes (Signed)
Pt reports woke up this am with left sided chest pain and SOB.  Reports pain is sharp and is worse with movement and is tender to touch.  Denies n/v.  Also c/o left eye twitching for the past few days.

## 2013-01-02 ENCOUNTER — Ambulatory Visit (INDEPENDENT_AMBULATORY_CARE_PROVIDER_SITE_OTHER): Payer: Medicaid Other | Admitting: Physician Assistant

## 2013-01-02 ENCOUNTER — Encounter: Payer: Self-pay | Admitting: Physician Assistant

## 2013-01-02 VITALS — BP 120/76 | HR 64 | Temp 98.0°F | Resp 18 | Wt 223.0 lb

## 2013-01-02 DIAGNOSIS — J029 Acute pharyngitis, unspecified: Secondary | ICD-10-CM

## 2013-01-02 NOTE — Progress Notes (Signed)
   Patient ID: Stacy [name] MRN: [id number], DOB: July 25, 1985, 27 y.o. Date of Encounter: 01/02/2013, 3:03 PM    Chief Complaint:  Chief Complaint  Patient presents with  . severe sore throat     HPI: 27 y.o. year old white female presents with c/o sore throat. Says this just started this morning. She has had no nasal congestion and no nasal mucus. Very little cough. No chest congestion. No fever. Works [employer]. Work sent her home today. She is scheduled to work next 2 days then is scheduled off for vacation.     Home Meds: See attached medication section for any medications that were entered at today's visit. The computer does not put those onto this list.The following list is a list of meds entered prior to today's visit.   Current Outpatient Prescriptions on File Prior to Visit  Medication Sig Dispense Refill  . acetaminophen (TYLENOL) 325 MG tablet Take 325 mg by mouth every 4 (four) hours as needed for pain.       Marland Kitchen aspirin-acetaminophen-caffeine (EXCEDRIN MIGRAINE) 250-250-65 MG per tablet Take 2 tablets by mouth daily as needed.       Marland Kitchen ibuprofen (ADVIL,MOTRIN) 200 MG tablet Take 800 mg by mouth every 6 (six) hours as needed for pain.        No current facility-administered medications on file prior to visit.    Allergies:  Allergies  Allergen Reactions  . Coconut Flavor Other (See Comments)    Mouth/throat lesions   . Pineapple Other (See Comments)    Mouth/throat leasions  . Vicodin (Hydrocodone-Acetaminophen) Other (See Comments)    Shaking/Rash      Review of Systems: See HPI for pertinent ROS. All other ROS negative.    Physical Exam: Blood pressure 120/76, pulse 64, temperature 98 F (36.7 C), temperature source Oral, resp. rate 18, weight 223 lb (101.152 kg), last menstrual period 12/24/2012., Body mass index is 34.92 kg/(m^2). General: Obese WF. Appears in no acute distress. HEENT: Normocephalic, atraumatic, eyes without discharge, sclera non-icteric, nares  are without discharge. Bilateral auditory canals clear, TM's are without perforation, pearly grey and translucent with reflective cone of light bilaterally. Oral cavity moist, posterior pharynx without exudate, erythema, peritonsillar abscess, or post nasal drip.There is no erythema and no exudate.  Neck: Supple. No thyromegaly. She reports tenderness wit palpation of bilateral tonsillar nodes and anterior cervical nodes but these are not enlarged.  Lungs: Clear bilaterally to auscultation without wheezes, rales, or rhonchi. Breathing is unlabored. Heart: Regular rhythm. No murmurs, rubs, or gallops. Msk:  Strength and tone normal for age. Neuro: Alert and oriented X 3. Moves all extremities spontaneously. Gait is normal. CNII-XII grossly in tact. Psych:  Responds to questions appropriately with a normal affect.   Results for orders placed in visit on 01/02/13  RAPID STREP SCREEN      Result Value Range   Source THROAT     Streptococcus, Group A Screen (Direct) NEG  NEGATIVE     ASSESSMENT AND PLAN:  27 y.o. year old female with  1. Viral pharyngitis 2. Sore throat - Rapid Strep Screen--Negative  Treat with lozenges spray prn. Note given for oow 01/02/13 -01/04/13.  F/U if dev fever or if sx persists > 1 week.   Stacy [name] [place], [place], [facility] 01/02/2013 3:03 PM

## 2013-01-19 ENCOUNTER — Emergency Department (HOSPITAL_COMMUNITY)
Admission: EM | Admit: 2013-01-19 | Discharge: 2013-01-19 | Disposition: A | Discharge status: Home / Self Care | Payer: Medicaid Other | Attending: Emergency Medicine | Admitting: Emergency Medicine

## 2013-01-19 ENCOUNTER — Encounter (HOSPITAL_COMMUNITY): Payer: Self-pay | Admitting: Emergency Medicine

## 2013-01-19 ENCOUNTER — Emergency Department (HOSPITAL_COMMUNITY): Payer: Medicaid Other

## 2013-01-19 DIAGNOSIS — Z87891 Personal history of nicotine dependence: Secondary | ICD-10-CM | POA: Insufficient documentation

## 2013-01-19 DIAGNOSIS — Z8679 Personal history of other diseases of the circulatory system: Secondary | ICD-10-CM | POA: Insufficient documentation

## 2013-01-19 DIAGNOSIS — R109 Unspecified abdominal pain: Secondary | ICD-10-CM | POA: Insufficient documentation

## 2013-01-19 DIAGNOSIS — Z8669 Personal history of other diseases of the nervous system and sense organs: Secondary | ICD-10-CM | POA: Insufficient documentation

## 2013-01-19 DIAGNOSIS — Z3202 Encounter for pregnancy test, result negative: Secondary | ICD-10-CM | POA: Insufficient documentation

## 2013-01-19 DIAGNOSIS — N83209 Unspecified ovarian cyst, unspecified side: Secondary | ICD-10-CM

## 2013-01-19 DIAGNOSIS — E669 Obesity, unspecified: Secondary | ICD-10-CM | POA: Insufficient documentation

## 2013-01-19 DIAGNOSIS — R112 Nausea with vomiting, unspecified: Secondary | ICD-10-CM | POA: Insufficient documentation

## 2013-01-19 LAB — COMPREHENSIVE METABOLIC PANEL
AST: 16 U/L (ref 0–37)
BUN: 11 mg/dL (ref 6–23)
CO2: 20 mEq/L (ref 19–32)
Calcium: 8.8 mg/dL (ref 8.4–10.5)
Chloride: 106 mEq/L (ref 96–112)
Creatinine, Ser: 0.6 mg/dL (ref 0.50–1.10)
GFR calc non Af Amer: >90 mL/min (ref >90)
Total Bilirubin: 0.6 mg/dL (ref 0.3–1.2)

## 2013-01-19 LAB — URINALYSIS, ROUTINE W REFLEX MICROSCOPIC
Leukocytes, UA: NEGATIVE
Nitrite: NEGATIVE
Protein, ur: NEGATIVE mg/dL
Specific Gravity, Urine: 1.034 — ABNORMAL HIGH (ref 1.005–1.030)
Urobilinogen, UA: 1 mg/dL (ref 0.0–1.0)

## 2013-01-19 LAB — CBC WITH DIFFERENTIAL/PLATELET
Basophils Absolute: 0 10*3/uL (ref 0.0–0.1)
Basophils Relative: 0 % (ref 0–1)
Eosinophils Relative: 1 % (ref 0–5)
HCT: 39.9 % (ref 36.0–46.0)
Hemoglobin: 14.2 g/dL (ref 12.0–15.0)
Lymphocytes Relative: 13 % (ref 12–46)
MCHC: 35.6 g/dL (ref 30.0–36.0)
MCV: 78.2 fL (ref 78.0–100.0)
Monocytes Absolute: 0.8 10*3/uL (ref 0.1–1.0)
Monocytes Relative: 6 % (ref 3–12)
Neutro Abs: 10.6 10*3/uL — ABNORMAL HIGH (ref 1.7–7.7)
RDW: 13.5 % (ref 11.5–15.5)

## 2013-01-19 LAB — PREGNANCY, URINE: Preg Test, Ur: NEGATIVE

## 2013-01-19 MED ORDER — SODIUM CHLORIDE 0.9 % IV BOLUS (SEPSIS)
1000.0000 mL | Freq: Once | INTRAVENOUS | Status: AC
  Administered 2013-01-19: 1000 mL via INTRAVENOUS

## 2013-01-19 MED ORDER — IOHEXOL 300 MG/ML  SOLN
25.0000 mL | INTRAMUSCULAR | Status: AC
  Administered 2013-01-19: 25 mL via ORAL

## 2013-01-19 MED ORDER — IOHEXOL 300 MG/ML  SOLN
100.0000 mL | Freq: Once | INTRAMUSCULAR | Status: AC | PRN
  Administered 2013-01-19: 80 mL via INTRAVENOUS

## 2013-01-19 MED ORDER — MORPHINE SULFATE 4 MG/ML IJ SOLN
4.0000 mg | Freq: Once | INTRAMUSCULAR | Status: AC
  Administered 2013-01-19: 4 mg via INTRAVENOUS
  Filled 2013-01-19: qty 1

## 2013-01-19 MED ORDER — ONDANSETRON HCL 4 MG/2ML IJ SOLN
4.0000 mg | Freq: Once | INTRAMUSCULAR | Status: AC
  Administered 2013-01-19: 4 mg via INTRAVENOUS
  Filled 2013-01-19: qty 2

## 2013-01-19 MED ORDER — OXYCODONE-ACETAMINOPHEN 5-325 MG PO TABS: 1.0000 | ORAL_TABLET | Freq: Four times a day (QID) | ORAL | Status: DC | PRN

## 2013-01-19 MED ORDER — ONDANSETRON HCL 4 MG PO TABS: 4.0000 mg | ORAL_TABLET | Freq: Four times a day (QID) | ORAL | Status: DC

## 2013-01-19 NOTE — ED Provider Notes (Signed)
Medical screening examination/treatment/procedure(s) were performed by non-physician practitioner and as supervising physician I was immediately available for consultation/collaboration.  Martha K Linker, MD 01/19/13 1502 

## 2013-01-19 NOTE — ED Notes (Signed)
Pt states that she drove herself to er. She has called for ride home. Await ride before giving pain meds

## 2013-01-19 NOTE — ED Notes (Signed)
Middle abd pain that rads  To rt side since midnight has had some vomiting she states  Denies vag d/c or dysuria or ovarian cyst lmp was 12/25/12 states has had tubes tied in 07

## 2013-01-19 NOTE — ED Provider Notes (Signed)
CSN: [id number]     Arrival date & time 01/19/13  1002 History     First MD Initiated Contact with Patient 01/19/13 1014     Chief Complaint  Patient presents with  . Abdominal Pain   (Consider location/radiation/quality/duration/timing/severity/associated sxs/prior Treatment) HPI Comments: Patient presents to the emergency department with chief complaint of abdominal pain since last night. She states that it is associated with nausea, vomiting, and diarrhea. She states that the abdominal pain is 9/10, and radiates to the lower right quadrant. She denies any hematemesis or hematochezia. Denies any vaginal discharge or dysuria. She states that she has had a cholecystectomy, as well as tubal ligation. She has tried taking Pepto-Bismol, and OTC pain medicine with no relief.  The history is provided by the patient. No language interpreter was used.    Past Medical History  Diagnosis Date  . Obesity   . Carpal tunnel syndrome   . Migraine    Past Surgical History  Procedure Laterality Date  . Cholecystectomy    . Cesarean section    . Tubal ligation    . Wisdom tooth extraction    . Cesarean section     Family History  Problem Relation Age of Onset  . Diabetes Mother   . Hypertension Father    History  Substance Use Topics  . Smoking status: Former Games developer  . Smokeless tobacco: Not on file  . Alcohol Use: No   OB History   Grav Para Term Preterm Abortions TAB SAB Ect Mult Living   2 2 2             Review of Systems  All other systems reviewed and are negative.    Allergies  Coconut flavor; Pineapple; and Vicodin  Home Medications   Current Outpatient Rx  Name  Route  Sig  Dispense  Refill  . ibuprofen (ADVIL,MOTRIN) 200 MG tablet   Oral   Take 400 mg by mouth every 6 (six) hours as needed for pain.           BP 115/77  Pulse 77  Temp(Src) 97.1 F (36.2 C) (Oral)  Resp 22  SpO2 97%  LMP 12/24/2012 Physical Exam  Nursing note and vitals  reviewed. Constitutional: She is oriented to person, place, and time. She appears well-developed and well-nourished.  HENT:  Head: Normocephalic and atraumatic.  Eyes: Conjunctivae and EOM are normal. Pupils are equal, round, and reactive to light.  Neck: Normal range of motion. Neck supple.  Cardiovascular: Normal rate and regular rhythm.  Exam reveals no gallop and no friction rub.   No murmur heard. Pulmonary/Chest: Effort normal and breath sounds normal. No respiratory distress. She has no wheezes. She has no rales. She exhibits no tenderness.  Abdominal: Soft. Bowel sounds are normal. She exhibits no distension and no mass. There is tenderness. There is no rebound and no guarding.  Diffuse abdominal tenderness, not well localized, and no rebound tenderness, no fluid wave, no signs of acute abdomen  Musculoskeletal: Normal range of motion. She exhibits no edema and no tenderness.  Neurological: She is alert and oriented to person, place, and time.  Skin: Skin is warm and dry.  Psychiatric: She has a normal mood and affect. Her behavior is normal. Judgment and thought content normal.    ED Course   Procedures (including critical care time)  Labs Reviewed  CBC WITH DIFFERENTIAL  COMPREHENSIVE METABOLIC PANEL  URINALYSIS, ROUTINE W REFLEX MICROSCOPIC   Results for orders placed during the hospital  encounter of 01/19/13  CBC WITH DIFFERENTIAL      Result Value Range   WBC 13.3 (*) 4.0 - 10.5 K/uL   RBC 5.10  3.87 - 5.11 MIL/uL   Hemoglobin 14.2  12.0 - 15.0 g/dL   HCT 19.1  47.8 - 29.5 %   MCV 78.2  78.0 - 100.0 fL   MCH 27.8  26.0 - 34.0 pg   MCHC 35.6  30.0 - 36.0 g/dL   RDW 62.1  30.8 - 65.7 %   Platelets 254  150 - 400 K/uL   Neutrophils Relative % 80 (*) 43 - 77 %   Neutro Abs 10.6 (*) 1.7 - 7.7 K/uL   Lymphocytes Relative 13  12 - 46 %   Lymphs Abs 1.8  0.7 - 4.0 K/uL   Monocytes Relative 6  3 - 12 %   Monocytes Absolute 0.8  0.1 - 1.0 K/uL   Eosinophils Relative 1   0 - 5 %   Eosinophils Absolute 0.1  0.0 - 0.7 K/uL   Basophils Relative 0  0 - 1 %   Basophils Absolute 0.0  0.0 - 0.1 K/uL  COMPREHENSIVE METABOLIC PANEL      Result Value Range   Sodium 138  135 - 145 mEq/L   Potassium 3.5  3.5 - 5.1 mEq/L   Chloride 106  96 - 112 mEq/L   CO2 20  19 - 32 mEq/L   Glucose, Bld 95  70 - 99 mg/dL   BUN 11  6 - 23 mg/dL   Creatinine, Ser 8.46  0.50 - 1.10 mg/dL   Calcium 8.8  8.4 - 96.2 mg/dL   Total Protein 7.0  6.0 - 8.3 g/dL   Albumin 3.7  3.5 - 5.2 g/dL   AST 16  0 - 37 U/L   ALT 15  0 - 35 U/L   Alkaline Phosphatase 45  39 - 117 U/L   Total Bilirubin 0.6  0.3 - 1.2 mg/dL   GFR calc non Af Amer >90  >90 mL/min   GFR calc Af Amer >90  >90 mL/min  URINALYSIS, ROUTINE W REFLEX MICROSCOPIC      Result Value Range   Color, Urine YELLOW  YELLOW   APPearance CLEAR  CLEAR   Specific Gravity, Urine 1.034 (*) 1.005 - 1.030   pH 5.5  5.0 - 8.0   Glucose, UA NEGATIVE  NEGATIVE mg/dL   Hgb urine dipstick NEGATIVE  NEGATIVE   Bilirubin Urine NEGATIVE  NEGATIVE   Ketones, ur NEGATIVE  NEGATIVE mg/dL   Protein, ur NEGATIVE  NEGATIVE mg/dL   Urobilinogen, UA 1.0  0.0 - 1.0 mg/dL   Nitrite NEGATIVE  NEGATIVE   Leukocytes, UA NEGATIVE  NEGATIVE  PREGNANCY, URINE      Result Value Range   Preg Test, Ur NEGATIVE  NEGATIVE   Ct Abdomen Pelvis W Contrast  01/19/2013   *RADIOLOGY REPORT*  Clinical Data: Right lower quadrant abdominal pain.  CT ABDOMEN AND PELVIS WITH CONTRAST  Technique:  Multidetector CT imaging of the abdomen and pelvis was performed following the standard protocol during bolus administration of intravenous contrast.  Contrast: 80mL OMNIPAQUE IOHEXOL 300 MG/ML  SOLN  Comparison: None.  Findings: Lung bases are clear.  No effusions.  Heart is normal size.  Prior cholecystectomy.  Liver, pancreas, adrenals and kidneys are normal.  Small calcifications in the spleen, likely related to old granulomatous disease.  Appendix is visualized and is normal.   There are small  right lower quadrant mesenteric lymph nodes and central mesenteric lymph nodes, none pathologically enlarged.  Small bowel is decompressed.  Small amount of free fluid in the pelvis.  There appears to be a collapsing cyst or follicle in the right ovary.  Uterus, ovaries and urinary bladder grossly unremarkable.  Urinary bladder is decompressed.  Aorta is normal caliber.  No acute bony abnormality.  IMPRESSION: Normal appendix.  Small amount of free fluid in the pelvis.  Collapsing cyst or follicle in the right ovary.   Original Report Authenticated By: Charlett Nose, M.D.     1. Abdominal  pain, other specified site   2. Ovarian cyst     MDM  Patient presents with n/v/d.  She has diffuse abdominal tenderness, but without any localization. No pelvic pain, no vaginal discharge. I am initially suspcious of gastroenteritis.    Patient discussed with Dr. Karma Ganja, who recommends CT to rule out appy, because the patient was sent by PCP for this reason.  Discussed the plan with the patient who is agreeable.  CT is negative, except for collapsing ovarian cyst/follicle.  Recommend OBGYN follow-up.  Patient pain controlled with morphine.  Discharge with a few pain pills and zofran.  Patient is stable and ready for discharge.  Roxy Horseman, PA-C 01/19/13 1455

## 2013-01-19 NOTE — ED Notes (Signed)
Pt given ciups of contrast to drink per ct

## 2013-01-20 ENCOUNTER — Emergency Department (HOSPITAL_COMMUNITY): Payer: No Typology Code available for payment source

## 2013-01-20 ENCOUNTER — Emergency Department (HOSPITAL_COMMUNITY)
Admission: EM | Admit: 2013-01-20 | Discharge: 2013-01-20 | Disposition: A | Discharge status: Home / Self Care | Payer: No Typology Code available for payment source | Attending: Emergency Medicine | Admitting: Emergency Medicine

## 2013-01-20 ENCOUNTER — Encounter (HOSPITAL_COMMUNITY): Payer: Self-pay | Admitting: Emergency Medicine

## 2013-01-20 DIAGNOSIS — Y92410 Unspecified street and highway as the place of occurrence of the external cause: Secondary | ICD-10-CM | POA: Insufficient documentation

## 2013-01-20 DIAGNOSIS — S335XXA Sprain of ligaments of lumbar spine, initial encounter: Secondary | ICD-10-CM | POA: Insufficient documentation

## 2013-01-20 DIAGNOSIS — Z8669 Personal history of other diseases of the nervous system and sense organs: Secondary | ICD-10-CM | POA: Insufficient documentation

## 2013-01-20 DIAGNOSIS — S39012A Strain of muscle, fascia and tendon of lower back, initial encounter: Secondary | ICD-10-CM

## 2013-01-20 DIAGNOSIS — Y9389 Activity, other specified: Secondary | ICD-10-CM | POA: Insufficient documentation

## 2013-01-20 DIAGNOSIS — Z791 Long term (current) use of non-steroidal anti-inflammatories (NSAID): Secondary | ICD-10-CM | POA: Insufficient documentation

## 2013-01-20 DIAGNOSIS — N83209 Unspecified ovarian cyst, unspecified side: Secondary | ICD-10-CM | POA: Insufficient documentation

## 2013-01-20 DIAGNOSIS — Z87891 Personal history of nicotine dependence: Secondary | ICD-10-CM | POA: Insufficient documentation

## 2013-01-20 DIAGNOSIS — E669 Obesity, unspecified: Secondary | ICD-10-CM | POA: Insufficient documentation

## 2013-01-20 MED ORDER — IBUPROFEN 800 MG PO TABS: 800.0000 mg | ORAL_TABLET | Freq: Three times a day (TID) | ORAL | Status: DC

## 2013-01-20 MED ORDER — OXYCODONE-ACETAMINOPHEN 5-325 MG PO TABS
2.0000 | ORAL_TABLET | Freq: Once | ORAL | Status: AC
  Administered 2013-01-20: 2 via ORAL
  Filled 2013-01-20: qty 2

## 2013-01-20 NOTE — ED Notes (Signed)
Pt ambulated to restroom without difficulty

## 2013-01-20 NOTE — ED Notes (Signed)
Gave pt sprite to drink 
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
## 2013-01-20 NOTE — ED Notes (Signed)
Patient removed from [facility] and C-collar removed by Dr Manus Gunning.

## 2013-01-20 NOTE — ED Notes (Signed)
See edp's assessment. °

## 2013-01-25 ENCOUNTER — Ambulatory Visit (INDEPENDENT_AMBULATORY_CARE_PROVIDER_SITE_OTHER): Payer: Medicaid Other | Admitting: Physician Assistant

## 2013-01-25 ENCOUNTER — Encounter: Payer: Self-pay | Admitting: Physician Assistant

## 2013-01-25 VITALS — BP 116/66 | HR 60 | Temp 98.1°F | Resp 18 | Wt 222.0 lb

## 2013-01-25 DIAGNOSIS — R109 Unspecified abdominal pain: Secondary | ICD-10-CM

## 2013-01-25 LAB — COMPLETE METABOLIC PANEL WITH GFR
ALT: 14 U/L (ref 0–35)
AST: 15 U/L (ref 0–37)
Albumin: 3.8 g/dL (ref 3.5–5.2)
Alkaline Phosphatase: 37 U/L — ABNORMAL LOW (ref 39–117)
Calcium: 8.7 mg/dL (ref 8.4–10.5)
Chloride: 105 mEq/L (ref 96–112)
Potassium: 4.2 mEq/L (ref 3.5–5.3)
Sodium: 138 mEq/L (ref 135–145)

## 2013-01-25 LAB — CBC W/MCH & 3 PART DIFF
HCT: 36 % (ref 36.0–46.0)
Hemoglobin: 12.3 g/dL (ref 12.0–15.0)
Lymphs Abs: 2.5 10*3/uL (ref 0.7–4.0)
MCH: 27.2 pg (ref 26.0–34.0)
MCHC: 34.2 g/dL (ref 30.0–36.0)
MCV: 79.6 fL (ref 78.0–100.0)
RBC: 4.52 MIL/uL (ref 3.87–5.11)
WBC mixed population %: 5 % (ref 3–18)
WBC mixed population: 0.3 10*3/uL (ref 0.1–1.8)

## 2013-01-25 NOTE — Progress Notes (Signed)
Patient ID: Stacy [name] MRN: [id number], DOB: [birth year], 27 y.o. Date of Encounter: @DATE @  Chief Complaint:  Chief Complaint  Patient presents with  . c/o abd/pelvic pain x 1 week  seen at ED last week for same     HPI: 27 y.o. year old white obese female  presents for f/u of abdominal pain. Went to ER about this 01/19/13- 6 days ago-but says " still having pain and I couldnot do my job-involves bending, lifting, so work sent me here." Works at [employer]. 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   ER Physician note reviewed from 01/19/13. Also got input from pt. She says she had developed vomiting at 1:30 a.m. "Vomited nonstop from 1:30-7:30 am" (vomiting stopped when ER gave her meds per pt). As well she had some diarrhea-pt says the diarrhea was not as frequent and severe as the vomiting but she did have diarrhea that Thursday throught that Saturday. Says she has not vomitied since ER visit but has felt very nauseas like about to vomit so has been taking the Zofran Rxed by ER.  Labs showed : WBC 13.3 all other labs normal, including H/H, CMET, UA, Pregnancy.  Pt has had cholecystectomy in past.  CT Was normal except did mention right ovarian cyst. However, it gives no measurements of its size and makes no mention of it being large, etc. Appendix was normal, remainder of CT normal.  Physicians note states that he thought she had gastroenteritis but did CT b/c pt concerned of appendicitis.    Past Medical History  Diagnosis Date  . Obesity   . Carpal tunnel syndrome   . Migraine      Home Meds: See attached medication section for current medication list. Any medications entered into computer today will not appear on this note's list. The medications listed below were entered prior to today. Current  Outpatient Prescriptions on File Prior to Visit  Medication Sig Dispense Refill  . ibuprofen (ADVIL,MOTRIN) 200 MG tablet Take 400 mg by mouth every 6 (six) hours as needed for pain.       Marland Kitchen ibuprofen (ADVIL,MOTRIN) 800 MG tablet Take 1 tablet (800 mg total) by mouth 3 (three) times daily.  21 tablet  0   No current facility-administered medications on file prior to visit.    Allergies:  Allergies  Allergen Reactions  . Coconut Flavor Other (See Comments)    Mouth/throat lesions   . Pineapple Other (See Comments)    Mouth/throat leasions  . Vicodin (Hydrocodone-Acetaminophen) Other (See Comments)    Shaking/Rash    History   Social History  . Marital Status: Single    Spouse Name: N/A    Number of Children: N/A  . Years of Education: N/A   Occupational History  . Not on file.   Social History Main Topics  . Smoking status: Former Smoker -- 0.04 packs/day for 1.5 years    Types: Cigarettes    Quit date: 11/21/2010  . Smokeless tobacco: Never Used  . Alcohol Use: No  . Drug Use: No  . Sexually Active: Not Currently    Birth Control/ Protection: Surgical   Other Topics Concern  . Not on file   Social History Narrative  . No narrative on file    Family History  Problem Relation Age of Onset  . Diabetes Mother   . Hypertension Father      Review of Systems:  See HPI for pertinent ROS. All other ROS negative.    Physical Exam: Blood pressure 116/66, pulse 60, temperature 98.1 F (36.7 C), temperature source Oral, resp. rate 18, weight 222 lb (100.699 kg), last menstrual period 12/24/2012., Body mass index is 34.76 kg/(m^2). General: Obese WF. Appears in no acute distress.Sitting comfortably in exam room. Appears to be in no pain.  Lungs: Clear bilaterally to auscultation without wheezes, rales, or rhonchi. Breathing is unlabored. Heart: RRR with S1 S2. No murmurs, rubs, or gallops. Abdomen: Soft,  non-distended with normoactive bowel sounds. No hepatomegaly. No  rebound/guarding. No obvious abdominal masses. I palpated entire abdomen repeatedly. The only area that she indicates any pain is beginning at umbilicus, extending straight over to the right and also just superior to this, toward right upper abdomen. No pain in upper RUQ at liver edge.  Musculoskeletal:  Strength and tone normal for age. Extremities/Skin: Warm and dry. No edema. Neuro: Alert and oriented X 3. Moves all extremities spontaneously. Gait is normal. CNII-XII grossly in tact. Psych:  Responds to questions appropriately with a normal affect.   Results for orders placed in visit on 01/25/13  CBC [facility] & 3 PART DIFF      Result Value Range   WBC 7.4  4.0 - 10.5 K/uL   RBC 4.52  3.87 - 5.11 MIL/uL   Hemoglobin 12.3  12.0 - 15.0 g/dL   HCT 16.1  09.6 - 04.5 %   MCV 79.6  78.0 - 100.0 fL   MCH 27.2  26.0 - 34.0 pg   MCHC 34.2  30.0 - 36.0 g/dL   RDW 40.9  81.1 - 91.4 %   Platelets 246  150 - 400 K/uL   Neutrophils Relative % 61  43 - 77 %   Neutro Abs 4.6  1.7 - 7.7 K/uL   Lymphocytes Relative 34  12 - 46 %   Lymphs Abs 2.5  0.7 - 4.0 K/uL   WBC mixed population % 5  3 - 18 %   WBC mixed population 0.3  0.1 - 1.8 K/uL     ASSESSMENT AND PLAN:  27 y.o. year old female with  1. Abdominal pain, unspecified site-I feel that her symptoms are secondary to viral gastroenteritis. I think the pain she is experiencing is secondary to inflammation in intestine sec to recent diarrhea and illness.  WBC Normal. I reassured pt of above assessment. May return to work. I discussed with her that the cyst seen on CT is not causing her current symptoms and was an incidntal finding.  - CBC w/[facility] & 3 Part Diff - COMPLETE METABOLIC PANEL WITH GFR   Signed, [address] [place], [place], [facility] 01/25/2013 12:22 PM

## 2013-01-26 ENCOUNTER — Telehealth: Payer: Self-pay | Admitting: Family Medicine

## 2013-01-26 NOTE — Telephone Encounter (Signed)
Message copied by Donne Anon on Thu Jan 26, 2013 10:05 AM ------      Message from: Allayne Butcher      Created: Wed Jan 25, 2013  4:42 PM       Labs normal. ------

## 2013-01-26 NOTE — Telephone Encounter (Signed)
Left patient mess that all lab results were normal.

## 2013-03-03 ENCOUNTER — Telehealth: Payer: Self-pay | Admitting: Family Medicine

## 2013-03-03 NOTE — Telephone Encounter (Signed)
Just now went to get in shower and noticed red bump under breast.  Area not painful.  She says it does appear to go into breast but is superficial.  Also states she checks her breast regularly and has never noticed this before.  Gave her appt for Monday and told patient if gets worse over weekend can go to Urgent Care.

## 2013-03-06 ENCOUNTER — Ambulatory Visit: Payer: Self-pay | Admitting: Family Medicine

## 2013-03-17 ENCOUNTER — Ambulatory Visit (INDEPENDENT_AMBULATORY_CARE_PROVIDER_SITE_OTHER): Payer: Medicaid Other | Admitting: Family Medicine

## 2013-03-17 ENCOUNTER — Encounter: Payer: Self-pay | Admitting: Family Medicine

## 2013-03-17 VITALS — BP 118/70 | HR 66 | Temp 98.3°F | Resp 14 | Ht 67.0 in | Wt 226.0 lb

## 2013-03-17 DIAGNOSIS — J029 Acute pharyngitis, unspecified: Secondary | ICD-10-CM

## 2013-03-17 LAB — RAPID STREP SCREEN (MED CTR MEBANE ONLY)

## 2013-03-17 MED ORDER — FIRST-DUKES MOUTHWASH MT SUSP: 5.0000 mL | Freq: Four times a day (QID) | OROMUCOSAL | Status: DC | PRN

## 2013-03-17 NOTE — Progress Notes (Signed)
Subjective:    Patient ID: Stacy [name], female    DOB: 12/31/1985, 27 y.o.   MRN: [id number]  HPI  Patient is a pleasant 27 year [name] white female who comes in today complaining of a sore throat. It began approximately 6 days ago. Today she had fever as high as 103.  She denies any cough, rhinorrhea, otalgia, nausea, vomiting, or rashes. She has had congestion. She denies sinus pain or postnasal drip. She denies headache. She still complains of pain with swallowing and talking. She denies any hoarseness.  A family member recently had a sore throat with blisters in the mouth but was never diagnosed with hand foot mouth disease. Past Medical History  Diagnosis Date  . Obesity   . Carpal tunnel syndrome   . Migraine    Current Outpatient Prescriptions on File Prior to Visit  Medication Sig Dispense Refill  . ibuprofen (ADVIL,MOTRIN) 800 MG tablet Take 1 tablet (800 mg total) by mouth 3 (three) times daily.  21 tablet  0   No current facility-administered medications on file prior to visit.   Allergies  Allergen Reactions  . Coconut Flavor Other (See Comments)    Mouth/throat lesions   . Pineapple Other (See Comments)    Mouth/throat leasions  . Vicodin [Hydrocodone-Acetaminophen] Other (See Comments)    Shaking/Rash   History   Social History  . Marital Status: Single    Spouse Name: N/A    Number of Children: N/A  . Years of Education: N/A   Occupational History  . Not on file.   Social History Main Topics  . Smoking status: Former Smoker -- 0.04 packs/day for 1.5 years    Types: Cigarettes    Quit date: 11/21/2010  . Smokeless tobacco: Never Used  . Alcohol Use: No  . Drug Use: No  . Sexual Activity: Not Currently    Birth Control/ Protection: Surgical   Other Topics Concern  . Not on file   Social History Narrative  . No narrative on file   Family History  Problem Relation Age of Onset  . Diabetes Mother   . Hypertension Father      Review of Systems   All other systems reviewed and are negative.       Objective:   Physical Exam  Vitals reviewed. Constitutional: She appears well-developed and well-nourished.  HENT:  Right Ear: External ear normal.  Left Ear: External ear normal.  Nose: Nose normal.  Mouth/Throat: Oropharynx is clear and moist. No oropharyngeal exudate.  Eyes: Conjunctivae are normal. No scleral icterus.  Neck: Neck supple.  Cardiovascular: Normal rate, regular rhythm and normal heart sounds.   No murmur heard. Pulmonary/Chest: Effort normal and breath sounds normal. No respiratory distress. She has no wheezes. She has no rales.  Abdominal: Soft. Bowel sounds are normal. She exhibits no distension. There is no tenderness. There is no rebound and no guarding.  Lymphadenopathy:    She has no cervical adenopathy.  Skin: Skin is warm. No rash noted.          Assessment & Plan:  1. Acute pharyngitis Strep screen is negative.  Patient likely has viral pharyngitis. She may have been exposed to hand foot mouth disease. Anticipate self limited resolution in 3-4 days. I recommended ibuprofen for pain and fever. Also gave patient prescription for Dukes Magic mouthwash. She can gargle and swallow 1 teaspoon every 4 hours as needed for pain. If symptoms worsen she is to call the office. If she is no better  in 3-4 days she will call the office. - Rapid strep screen

## 2013-05-02 ENCOUNTER — Encounter (HOSPITAL_COMMUNITY): Payer: Self-pay | Admitting: Emergency Medicine

## 2013-05-02 ENCOUNTER — Emergency Department (HOSPITAL_COMMUNITY)
Admission: EM | Admit: 2013-05-02 | Discharge: 2013-05-02 | Disposition: A | Discharge status: Home / Self Care | Payer: Medicaid Other | Attending: Emergency Medicine | Admitting: Emergency Medicine

## 2013-05-02 DIAGNOSIS — Z791 Long term (current) use of non-steroidal anti-inflammatories (NSAID): Secondary | ICD-10-CM | POA: Insufficient documentation

## 2013-05-02 DIAGNOSIS — M25569 Pain in unspecified knee: Secondary | ICD-10-CM | POA: Insufficient documentation

## 2013-05-02 DIAGNOSIS — M222X2 Patellofemoral disorders, left knee: Secondary | ICD-10-CM

## 2013-05-02 DIAGNOSIS — Z87891 Personal history of nicotine dependence: Secondary | ICD-10-CM | POA: Insufficient documentation

## 2013-05-02 DIAGNOSIS — Z8679 Personal history of other diseases of the circulatory system: Secondary | ICD-10-CM | POA: Insufficient documentation

## 2013-05-02 DIAGNOSIS — E669 Obesity, unspecified: Secondary | ICD-10-CM | POA: Insufficient documentation

## 2013-05-02 DIAGNOSIS — Z8669 Personal history of other diseases of the nervous system and sense organs: Secondary | ICD-10-CM | POA: Insufficient documentation

## 2013-05-02 MED ORDER — DICLOFENAC SODIUM 75 MG PO TBEC: 75.0000 mg | DELAYED_RELEASE_TABLET | Freq: Two times a day (BID) | ORAL | Status: DC

## 2013-05-02 MED ORDER — OXYCODONE-ACETAMINOPHEN 5-325 MG PO TABS: 1.0000 | ORAL_TABLET | ORAL | Status: DC | PRN
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
## 2013-05-02 NOTE — ED Provider Notes (Signed)
CSN: [id number]     Arrival date & time 05/02/13  1835 History   First MD Initiated Contact with Patient 05/02/13 1916     Chief Complaint  Patient presents with  . Knee Pain   (Consider location/radiation/quality/duration/timing/severity/associated sxs/prior Treatment) Patient is a 27 y.o. female presenting with knee pain. The history is provided by the patient.  Knee Pain Location:  Knee Time since incident: Pain began early this morning and has persisted since. Injury: no   Knee location:  L knee Pain details:    Quality:  Aching and throbbing   Radiates to:  Does not radiate   Severity:  Moderate   Onset quality:  Gradual   Timing:  Constant   Progression:  Worsening Chronicity:  Recurrent Dislocation: no   Foreign body present:  No foreign bodies Prior injury to area:  Yes Relieved by:  Nothing Worsened by:  Activity and bearing weight Ineffective treatments:  NSAIDs Associated symptoms: no back pain, no decreased ROM, no fever, no neck pain, no numbness, no swelling and no tingling     Past Medical History  Diagnosis Date  . Obesity   . Carpal tunnel syndrome   . Migraine    Past Surgical History  Procedure Laterality Date  . Cholecystectomy    . Cesarean section    . Tubal ligation    . Wisdom tooth extraction    . Cesarean section     Family History  Problem Relation Age of Onset  . Diabetes Mother   . Hypertension Father    History  Substance Use Topics  . Smoking status: Former Smoker -- 0.04 packs/day for 1.5 years    Types: Cigarettes    Quit date: 11/21/2010  . Smokeless tobacco: Never Used  . Alcohol Use: No   OB History   Grav Para Term Preterm Abortions TAB SAB Ect Mult Living   2 2 2       2      Review of Systems  Constitutional: Negative for fever and chills.  Genitourinary: Negative for dysuria and difficulty urinating.  Musculoskeletal: Positive for arthralgias. Negative for back pain, gait problem, joint swelling and neck pain.   Skin: Negative for color change and wound.  All other systems reviewed and are negative.    Allergies  Coconut flavor; Pineapple; and Vicodin  Home Medications   Current Outpatient Rx  Name  Route  Sig  Dispense  Refill  . ibuprofen (ADVIL,MOTRIN) 800 MG tablet   Oral   Take 1 tablet (800 mg total) by mouth 3 (three) times daily.   21 tablet   0   . Diphenhyd-Hydrocort-Nystatin (FIRST-DUKES MOUTHWASH) SUSP   Mouth/Throat   Use as directed 5 mLs in the mouth or throat 4 (four) times daily as needed.   120 mL   0    BP 115/72  Pulse 63  Temp(Src) 98.3 F (36.8 C) (Oral)  Resp 16  Ht 5\' 7"  (1.702 m)  Wt 230 lb (104.327 kg)  BMI 36.01 kg/m2  SpO2 99%  LMP 04/19/2013 Physical Exam  Nursing note and vitals reviewed. Constitutional: She is oriented to person, place, and time. She appears well-developed and well-nourished. No distress.  Cardiovascular: Normal rate, regular rhythm, normal heart sounds and intact distal pulses.   No murmur heard. Pulmonary/Chest: Effort normal and breath sounds normal. No respiratory distress.  Musculoskeletal: She exhibits tenderness. She exhibits no edema.  ttp of the left anterior knee.  Patella crepitus is present .  No erythema,  effusion, or step-off deformity.  DP pulse brisk, distal sensation intact. Calf is soft and NT.  Neurological: She is alert and oriented to person, place, and time. She exhibits normal muscle tone. Coordination normal.  Skin: Skin is warm and dry. No erythema.    ED Course  Procedures (including critical care time) Labs Review Labs Reviewed - No data to display Imaging Review No results found.  EKG Interpretation   None       MDM   Patient with reoccurring pain to the left knee. No acute trauma to indicate need for imaging at this time Mild patellar crepitus on exam. No concerning symptoms for septic joint. Compartments are soft. Neurovascularly intact. Symptoms are likely related to patellofemoral  syndrome. We'll provide symptomatic treatment with anti-inflammatories and #8 Percocet for pain. Knee immobilizer placed at patient's request. She agrees to close orthopedic followup referral information given for Dr. Romeo Apple  Samarah Hogle L. Trisha Mangle, PA-C 05/02/13 9147

## 2013-05-03 NOTE — ED Provider Notes (Signed)
Medical screening examination/treatment/procedure(s) were performed by non-physician practitioner and as supervising physician I was immediately available for consultation/collaboration.  EKG Interpretation   None         Amalio Loe W Aerionna Moravek, MD 05/03/13 1513 

## 2013-05-08 ENCOUNTER — Encounter (HOSPITAL_COMMUNITY): Payer: Self-pay | Admitting: Emergency Medicine

## 2013-05-08 ENCOUNTER — Emergency Department (HOSPITAL_COMMUNITY)
Admission: EM | Admit: 2013-05-08 | Discharge: 2013-05-08 | Disposition: A | Discharge status: Home / Self Care | Payer: Medicaid Other | Source: Home / Self Care | Attending: Family Medicine | Admitting: Family Medicine

## 2013-05-08 DIAGNOSIS — N390 Urinary tract infection, site not specified: Secondary | ICD-10-CM

## 2013-05-08 LAB — POCT URINALYSIS DIP (DEVICE)
Bilirubin Urine: NEGATIVE
Ketones, ur: NEGATIVE mg/dL
Protein, ur: NEGATIVE mg/dL
Specific Gravity, Urine: 1.02 (ref 1.005–1.030)
pH: 7.5 (ref 5.0–8.0)

## 2013-05-08 LAB — POCT PREGNANCY, URINE: Preg Test, Ur: NEGATIVE

## 2013-05-08 MED ORDER — CEPHALEXIN 500 MG PO CAPS: 500.0000 mg | ORAL_CAPSULE | Freq: Two times a day (BID) | ORAL | Status: DC

## 2013-05-08 NOTE — ED Provider Notes (Signed)
CATE ORAVEC is a 27 y.o. female who presents to Urgent Care today for urinary urgency frequency and dysuria. This is been present since yesterday. Patient has tried AZO which did not help much. She denies any fevers or chills nausea vomiting or diarrhea. She feels well otherwise.   Past Medical History  Diagnosis Date  . Obesity   . Carpal tunnel syndrome   . Migraine    History  Substance Use Topics  . Smoking status: Former Smoker -- 0.04 packs/day for 1.5 years    Types: Cigarettes    Quit date: 11/21/2010  . Smokeless tobacco: Never Used  . Alcohol Use: No   ROS as above Medications reviewed. No current facility-administered medications for this encounter.   Current Outpatient Prescriptions  Medication Sig Dispense Refill  . cephALEXin (KEFLEX) 500 MG capsule Take 1 capsule (500 mg total) by mouth 2 (two) times daily.  14 capsule  0    Exam:  LMP 04/27/2013 Gen: Well NAD HEENT: EOMI,  MMM Lungs: CTABL Nl WOB Heart: RRR no MRG Abd: NABS, NT, ND, soft. No CV angle tenderness to percussion Exts: Non edematous BL  LE, warm and well perfused.   Results for orders placed during the hospital encounter of 05/08/13 (from the past 24 hour(s))  POCT URINALYSIS DIP (DEVICE)     Status: Abnormal   Collection Time    05/08/13 12:41 PM      Result Value Range   Glucose, UA NEGATIVE  NEGATIVE mg/dL   Bilirubin Urine NEGATIVE  NEGATIVE   Ketones, ur NEGATIVE  NEGATIVE mg/dL   Specific Gravity, Urine 1.020  1.005 - 1.030   Hgb urine dipstick TRACE (*) NEGATIVE   pH 7.5  5.0 - 8.0   Protein, ur NEGATIVE  NEGATIVE mg/dL   Urobilinogen, UA 1.0  0.0 - 1.0 mg/dL   Nitrite NEGATIVE  NEGATIVE   Leukocytes, UA LARGE (*) NEGATIVE  POCT PREGNANCY, URINE     Status: None   Collection Time    05/08/13 12:47 PM      Result Value Range   Preg Test, Ur NEGATIVE  NEGATIVE   No results found.  Assessment and Plan: 27 y.o. female with urinary tract infection. Plan to treat empirically  with Keflex.  Discussed warning signs or symptoms. Please see discharge instructions. Patient expresses understanding.      Rodolph Bong, MD 05/08/13 [phone]

## 2013-05-08 NOTE — ED Notes (Signed)
C/o UTI which started last night States AZO medication taken but no reflief

## 2013-05-12 ENCOUNTER — Telehealth: Payer: Self-pay | Admitting: *Deleted

## 2013-05-14 NOTE — Telephone Encounter (Signed)
.?   OK to Refill , We give this to her sporadically after she had a period.

## 2013-05-15 MED ORDER — METRONIDAZOLE 500 MG PO TABS: 500.0000 mg | ORAL_TABLET | Freq: Two times a day (BID) | ORAL | Status: DC

## 2013-05-15 NOTE — Telephone Encounter (Signed)
Ok for flagyl 500 bid for 7 days for possible bv

## 2013-05-15 NOTE — Telephone Encounter (Signed)
Med sent to pharm 

## 2013-05-31 ENCOUNTER — Emergency Department (HOSPITAL_COMMUNITY)
Admission: EM | Admit: 2013-05-31 | Discharge: 2013-05-31 | Disposition: A | Discharge status: Home / Self Care | Payer: Medicaid Other | Attending: Emergency Medicine | Admitting: Emergency Medicine

## 2013-05-31 ENCOUNTER — Encounter (HOSPITAL_COMMUNITY): Payer: Self-pay | Admitting: Emergency Medicine

## 2013-05-31 ENCOUNTER — Emergency Department (HOSPITAL_COMMUNITY): Payer: Medicaid Other

## 2013-05-31 DIAGNOSIS — R0789 Other chest pain: Secondary | ICD-10-CM

## 2013-05-31 DIAGNOSIS — R072 Precordial pain: Secondary | ICD-10-CM | POA: Diagnosis present

## 2013-05-31 DIAGNOSIS — R071 Chest pain on breathing: Secondary | ICD-10-CM | POA: Insufficient documentation

## 2013-05-31 DIAGNOSIS — Z87891 Personal history of nicotine dependence: Secondary | ICD-10-CM | POA: Insufficient documentation

## 2013-05-31 DIAGNOSIS — Z8669 Personal history of other diseases of the nervous system and sense organs: Secondary | ICD-10-CM | POA: Insufficient documentation

## 2013-05-31 DIAGNOSIS — E669 Obesity, unspecified: Secondary | ICD-10-CM | POA: Insufficient documentation

## 2013-05-31 DIAGNOSIS — Z8679 Personal history of other diseases of the circulatory system: Secondary | ICD-10-CM | POA: Insufficient documentation

## 2013-05-31 LAB — POCT I-STAT, CHEM 8
BUN: 9 mg/dL (ref 6–23)
Calcium, Ion: 1.19 mmol/L (ref 1.12–1.23)
Chloride: 105 mEq/L (ref 96–112)
HCT: 38 % (ref 36.0–46.0)
Sodium: 142 mEq/L (ref 135–145)
TCO2: 25 mmol/L (ref 0–100)

## 2013-05-31 LAB — POCT I-STAT TROPONIN I

## 2013-05-31 LAB — CK TOTAL AND CKMB (NOT AT ARMC)
CK, MB: 0.7 ng/mL (ref 0.3–4.0)
Total CK: 92 U/L (ref 7–177)

## 2013-05-31 MED ORDER — ASPIRIN 81 MG PO CHEW
324.0000 mg | CHEWABLE_TABLET | Freq: Once | ORAL | Status: AC
  Administered 2013-05-31: 324 mg via ORAL
  Filled 2013-05-31: qty 4

## 2013-05-31 MED ORDER — OXYCODONE-ACETAMINOPHEN 5-325 MG PO TABS: 2.0000 | ORAL_TABLET | ORAL | Status: DC | PRN

## 2013-05-31 NOTE — ED Notes (Signed)
Spoke with Bjorn Loser PA for Cardiology, states no further cardiac work up indicated at this time. Requesting for this information to be relayed to Dr. Radford Pax. Dr. Radford Pax made aware.

## 2013-05-31 NOTE — ED Notes (Signed)
Pt offered pain medication by Dr. Radford Pax and declines at this time.

## 2013-05-31 NOTE — ED Notes (Signed)
Cardiology at bedside.

## 2013-05-31 NOTE — ED Notes (Signed)
Pt presents to department for evaluation of midsternal non radiating chest pain. Ongoing x4 days. Also states SOB. 9/10 stabbing sensation at the time, becomes worse with movement. Pt is alert and oriented x4. NAD.

## 2013-05-31 NOTE — ED Provider Notes (Signed)
CSN: [id number]     Arrival date & time 05/31/13  0810 History   First MD Initiated Contact with Patient 05/31/13 [phone]     Chief Complaint  Patient presents with  . Chest Pain  ) HPI Pt presents to department for evaluation of midsternal non radiating chest pain. Ongoing x4 days. Also states SOB. 9/10 stabbing sensation at the time, becomes worse with movement. Pt is alert and oriented x4. NAD.  Patient has history of sternal fracture or which is remote.  Has had chest wall pain the past.  Patient has no risk factors of hypertension, diabetes, cocaine abuse.  Patient is a nonsmoker.  Past Medical History  Diagnosis Date  . Obesity   . Carpal tunnel syndrome   . Migraine    Past Surgical History  Procedure Laterality Date  . Cholecystectomy    . Cesarean section    . Tubal ligation    . Wisdom tooth extraction    . Cesarean section     Family History  Problem Relation Age of Onset  . Diabetes Mother   . Hypertension Father    History  Substance Use Topics  . Smoking status: Former Smoker -- 0.04 packs/day for 1.5 years    Types: Cigarettes    Quit date: 11/21/2010  . Smokeless tobacco: Never Used  . Alcohol Use: No   OB History   Grav Para Term Preterm Abortions TAB SAB Ect Mult Living   2 2 2       2      Review of Systems All other systems reviewed and are negative Allergies  Coconut flavor; Pineapple; and Vicodin  Home Medications   Current Outpatient Rx  Name  Route  Sig  Dispense  Refill  . ibuprofen (ADVIL,MOTRIN) 200 MG tablet   Oral   Take 800 mg by mouth every 6 (six) hours as needed for moderate pain.          BP 101/53  Pulse 49  Temp(Src) 98.7 F (37.1 C) (Oral)  Resp 15  Ht 5\' 7"  (1.702 m)  Wt 230 lb (104.327 kg)  BMI 36.01 kg/m2  SpO2 98%  LMP 05/22/2013 Physical Exam  Nursing note and vitals reviewed. Constitutional: She is oriented to person, place, and time. She appears well-developed and well-nourished. No distress.  HENT:  Head:  Normocephalic and atraumatic.  Eyes: Pupils are equal, round, and reactive to light.  Neck: Normal range of motion.  Cardiovascular: Normal rate and intact distal pulses.   Pulmonary/Chest: No respiratory distress.    Abdominal: Normal appearance. She exhibits no distension.  Musculoskeletal: Normal range of motion.  Neurological: She is alert and oriented to person, place, and time. No cranial nerve deficit.  Skin: Skin is warm and dry. No rash noted.  Psychiatric: She has a normal mood and affect. Her behavior is normal.    ED Course  Procedures (including critical care time) Labs Review Labs Reviewed  POCT I-STAT TROPONIN I - Abnormal; Notable for the following:    Troponin i, poc 0.17 (*)    All other components within normal limits  CK TOTAL AND CKMB  TROPONIN I  POCT I-STAT, CHEM 8   Imaging Review Dg Chest Portable 1 View  05/31/2013   CLINICAL DATA:  Chest pain and shortness of breath.  EXAM: PORTABLE CHEST - 1 VIEW  COMPARISON:  Two-view chest 01/20/2013.  FINDINGS: The heart size is normal. The lungs are clear. The visualized soft tissues and bony thorax are unremarkable.  IMPRESSION: Negative one-view chest.   Electronically Signed   By: Gennette Pac M.D.   On: 05/31/2013 11:27    EKG Interpretation    Date/Time:  Wednesday May 31 2013 08:17:46 EST Ventricular Rate:  71 PR Interval:  142 QRS Duration: 88 QT Interval:  388 QTC Calculation: 421 R Axis:   44 Text Interpretation:  Normal sinus rhythm with sinus arrhythmia Normal ECG Confirmed by Shelena Castelluccio  MD, Sheza Strickland ([id number]) on 05/31/2013 8:24:54 AM           Was seen and evaluated by cardiology.  Repeat enzymes were negative.  Cardiology doubts ACS as etiology.  It felt like most likely false positive LAD international units. MDM   1. Precordial pain   2. Chest wall pain        Nelia Shi, MD 05/31/13 [phone]

## 2013-05-31 NOTE — ED Notes (Signed)
Pt comfortable with d/c and f/u instructions. Prescriptions x1 
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
## 2013-05-31 NOTE — ED Notes (Signed)
Patient lab results given to Dr.Beaton. 

## 2013-06-30 ENCOUNTER — Other Ambulatory Visit: Payer: Self-pay | Admitting: Family Medicine

## 2013-06-30 MED ORDER — VALACYCLOVIR HCL 500 MG PO TABS: 500.0000 mg | ORAL_TABLET | Freq: Every day | ORAL | Status: DC

## 2013-06-30 NOTE — Telephone Encounter (Signed)
Med sent to pharm 

## 2013-07-17 ENCOUNTER — Ambulatory Visit (INDEPENDENT_AMBULATORY_CARE_PROVIDER_SITE_OTHER): Payer: Medicaid Other | Admitting: Family Medicine

## 2013-07-17 ENCOUNTER — Encounter: Payer: Self-pay | Admitting: Family Medicine

## 2013-07-17 VITALS — BP 128/72 | HR 68 | Temp 97.2°F | Resp 16 | Ht 68.0 in | Wt 230.0 lb

## 2013-07-17 DIAGNOSIS — J029 Acute pharyngitis, unspecified: Secondary | ICD-10-CM

## 2013-07-17 DIAGNOSIS — R509 Fever, unspecified: Secondary | ICD-10-CM

## 2013-07-17 LAB — INFLUENZA A AND B
Inflenza A Ag: NEGATIVE
Influenza B Ag: NEGATIVE

## 2013-07-17 LAB — RAPID STREP SCREEN (MED CTR MEBANE ONLY): STREPTOCOCCUS, GROUP A SCREEN (DIRECT): NEGATIVE

## 2013-07-17 NOTE — Progress Notes (Signed)
   Subjective:    Patient ID: Stacy [name], female    DOB: 06/27/1985, 28 y.o.   MRN: [id number]  HPI Patient has had a severe sore throat, cough, congestion and fever for 2 days.  She denies any otalgia, sinus pain, n/v/d. Past Medical History  Diagnosis Date  . Obesity   . Carpal tunnel syndrome   . Migraine    Current Outpatient Prescriptions on File Prior to Visit  Medication Sig Dispense Refill  . ibuprofen (ADVIL,MOTRIN) 200 MG tablet Take 800 mg by mouth every 6 (six) hours as needed for moderate pain.      . valACYclovir (VALTREX) 500 MG tablet Take 1 tablet (500 mg total) by mouth daily. 1 tab po qd : 1 tab po BID for outbreak  45 tablet  5   No current facility-administered medications on file prior to visit.   Allergies  Allergen Reactions  . Coconut Flavor Other (See Comments)    Mouth/throat lesions   . Pineapple Other (See Comments)    Mouth/throat leasions  . Vicodin [Hydrocodone-Acetaminophen] Other (See Comments)    Shaking/Rash   History   Social History  . Marital Status: Single    Spouse Name: N/A    Number of Children: N/A  . Years of Education: N/A   Occupational History  . Crew member at [employer]    Social History Main Topics  . Smoking status: Former Smoker -- 0.04 packs/day for 1.5 years    Types: Cigarettes    Quit date: 11/21/2010  . Smokeless tobacco: Never Used  . Alcohol Use: No  . Drug Use: No  . Sexual Activity: Not Currently    Birth Control/ Protection: Surgical   Other Topics Concern  . Not on file   Social History Narrative   Pt lives with 2 children, ages 649 & 7.      Review of Systems  All other systems reviewed and are negative.       Objective:   Physical Exam  Vitals reviewed. Constitutional: She appears well-developed and well-nourished.  HENT:  Right Ear: Tympanic membrane, external ear and ear canal normal.  Left Ear: Tympanic membrane, external ear and ear canal normal.  Nose: Nose normal. No mucosal  edema or rhinorrhea.  Mouth/Throat: Uvula is midline, oropharynx is clear and moist and mucous membranes are normal. No oropharyngeal exudate.  Neck: Neck supple.  Cardiovascular: Normal rate, regular rhythm and normal heart sounds.   No murmur heard. Pulmonary/Chest: Effort normal and breath sounds normal. No respiratory distress. She has no wheezes. She has no rales.  Lymphadenopathy:    She has no cervical adenopathy.          Assessment & Plan:  1. Fever, unspecified - Influenza a and b  2. Sore throat - Rapid strep screen  Both flu and strep tests are negative.  This appears to be a cold.  Recommended supportive care including tylenol and motrin for fever and myalgias, chloraseptic for sore throat, and mucinex dm for cough.

## 2013-07-19 ENCOUNTER — Encounter (HOSPITAL_COMMUNITY): Payer: Self-pay | Admitting: Emergency Medicine

## 2013-07-19 ENCOUNTER — Emergency Department (HOSPITAL_COMMUNITY)
Admission: EM | Admit: 2013-07-19 | Discharge: 2013-07-19 | Disposition: A | Discharge status: Home / Self Care | Payer: Medicaid Other | Attending: Emergency Medicine | Admitting: Emergency Medicine

## 2013-07-19 DIAGNOSIS — J3489 Other specified disorders of nose and nasal sinuses: Secondary | ICD-10-CM | POA: Insufficient documentation

## 2013-07-19 DIAGNOSIS — E669 Obesity, unspecified: Secondary | ICD-10-CM | POA: Insufficient documentation

## 2013-07-19 DIAGNOSIS — H53149 Visual discomfort, unspecified: Secondary | ICD-10-CM | POA: Insufficient documentation

## 2013-07-19 DIAGNOSIS — Z87891 Personal history of nicotine dependence: Secondary | ICD-10-CM | POA: Insufficient documentation

## 2013-07-19 DIAGNOSIS — H109 Unspecified conjunctivitis: Secondary | ICD-10-CM | POA: Insufficient documentation

## 2013-07-19 DIAGNOSIS — Z8679 Personal history of other diseases of the circulatory system: Secondary | ICD-10-CM | POA: Insufficient documentation

## 2013-07-19 MED ORDER — TOBRAMYCIN 0.3 % OP SOLN
2.0000 [drp] | OPHTHALMIC | Status: DC
  Administered 2013-07-19: 2 [drp] via OPHTHALMIC
  Filled 2013-07-19: qty 5

## 2013-07-19 NOTE — ED Notes (Signed)
Pt reports right eye pain that began this morning. Pt states right eye was "red with gunk on it" this morning when she woke up. Pt has used eye drops to help with irritation.

## 2013-07-19 NOTE — ED Provider Notes (Signed)
  Medical screening examination/treatment/procedure(s) were performed by non-physician practitioner and as supervising physician I was immediately available for consultation/collaboration.      Rosalynn Sergent, MD 07/19/13 2020 

## 2013-07-19 NOTE — Discharge Instructions (Signed)
Your examination is consistent with conjunctivitis. Please wash hands frequently. Please keep your distance from people. Please do not use any contact lens until this infection is completely resolved. Please apply cool compresses to the left eye. Use tobramycin eyedrops every 4 hours for the next 5 days. May use Tylenol Extra Strength or ibuprofen 800 mg every 6 hours as needed for pain. Dark sunglasses may also be helpful for protection from fluorescent or natural lites. Conjunctivitis Conjunctivitis is commonly called "pink eye." Conjunctivitis can be caused by bacterial or viral infection, allergies, or injuries. There is usually redness of the lining of the eye, itching, discomfort, and sometimes discharge. There may be deposits of matter along the eyelids. A viral infection usually causes a watery discharge, while a bacterial infection causes a yellowish, thick discharge. Pink eye is very contagious and spreads by direct contact. You may be given antibiotic eyedrops as part of your treatment. Before using your eye medicine, remove all drainage from the eye by washing gently with warm water and cotton balls. Continue to use the medication until you have awakened 2 mornings in a row without discharge from the eye. Do not rub your eye. This increases the irritation and helps spread infection. Use separate towels from other household members. Wash your hands with soap and water before and after touching your eyes. Use cold compresses to reduce pain and sunglasses to relieve irritation from light. Do not wear contact lenses or wear eye makeup until the infection is gone. SEEK MEDICAL CARE IF:   Your symptoms are not better after 3 days of treatment.  You have increased pain or trouble seeing.  The outer eyelids become very red or swollen. Document Released: 07/16/2004 Document Revised: 08/31/2011 Document Reviewed: 06/08/2005 [facility] Patient Information 2014 [place], [place].

## 2013-07-19 NOTE — ED Provider Notes (Signed)
CSN: [id number]     Arrival date & time 07/19/13  1503 History   First MD Initiated Contact with Patient 07/19/13 1623     Chief Complaint  Patient presents with  . Eye Pain   (Consider location/radiation/quality/duration/timing/severity/associated sxs/prior Treatment) Patient is a 28 y.o. female presenting with eye pain. The history is provided by the patient.  Eye Pain This is a new problem. The current episode started yesterday. The problem occurs constantly. The problem has been gradually worsening. Associated symptoms include congestion. Pertinent negatives include no abdominal pain, arthralgias, chest pain, chills, coughing, headaches, nausea, neck pain, swollen glands or vomiting. Exacerbated by: bright lights. She has tried nothing for the symptoms. The treatment provided no relief.    Past Medical History  Diagnosis Date  . Obesity   . Carpal tunnel syndrome   . Migraine    Past Surgical History  Procedure Laterality Date  . Cholecystectomy    . Cesarean section    . Tubal ligation    . Wisdom tooth extraction    . Cesarean section     Family History  Problem Relation Age of Onset  . Diabetes Mother   . Hypertension Father    History  Substance Use Topics  . Smoking status: Former Smoker -- 0.04 packs/day for 1.5 years    Types: Cigarettes    Quit date: 11/21/2010  . Smokeless tobacco: Never Used  . Alcohol Use: No   OB History   Grav Para Term Preterm Abortions TAB SAB Ect Mult Living   2 2 2       2      Review of Systems  Constitutional: Negative for chills and activity change.       All ROS Neg except as noted in HPI  HENT: Positive for congestion. Negative for nosebleeds.   Eyes: Positive for photophobia, pain, discharge and redness.  Respiratory: Negative for cough, shortness of breath and wheezing.   Cardiovascular: Negative for chest pain and palpitations.  Gastrointestinal: Negative for nausea, vomiting, abdominal pain and blood in stool.   Genitourinary: Negative for dysuria, frequency and hematuria.  Musculoskeletal: Negative for arthralgias, back pain and neck pain.  Skin: Negative.   Neurological: Negative for dizziness, seizures, speech difficulty and headaches.  Psychiatric/Behavioral: Negative for hallucinations and confusion.    Allergies  Coconut flavor; Pineapple; and Vicodin  Home Medications   Current Outpatient Rx  Name  Route  Sig  Dispense  Refill  . ibuprofen (ADVIL,MOTRIN) 200 MG tablet   Oral   Take 800 mg by mouth every 6 (six) hours as needed for moderate pain.         . valACYclovir (VALTREX) 500 MG tablet   Oral   Take 1 tablet (500 mg total) by mouth daily. 1 tab po qd : 1 tab po BID for outbreak   45 tablet   5    BP 119/68  Pulse 61  Temp(Src) 97.7 F (36.5 C) (Oral)  Resp 18  SpO2 99%  LMP 07/16/2013 Physical Exam  Nursing note and vitals reviewed. Constitutional: She is oriented to person, place, and time. She appears well-developed and well-nourished.  Non-toxic appearance.  HENT:  Head: Normocephalic.  Right Ear: Tympanic membrane and external ear normal.  Left Ear: Tympanic membrane and external ear normal.  Eyes: EOM are normal. Pupils are equal, round, and reactive to light. Left eye exhibits discharge. Left eye exhibits no exudate and no hordeolum. No foreign body present in the left eye. Right conjunctiva has  no hemorrhage. Left conjunctiva is injected. Left conjunctiva has no hemorrhage. No scleral icterus.  Neck: Normal range of motion. Neck supple. Carotid bruit is not present.  Cardiovascular: Normal rate, regular rhythm, normal heart sounds, intact distal pulses and normal pulses.   Pulmonary/Chest: Breath sounds normal. No respiratory distress.  Abdominal: Soft. Bowel sounds are normal. There is no tenderness. There is no guarding.  Musculoskeletal: Normal range of motion.  Lymphadenopathy:       Head (right side): No submandibular adenopathy present.       Head  (left side): No submandibular adenopathy present.    She has no cervical adenopathy.  Neurological: She is alert and oriented to person, place, and time. She has normal strength. No cranial nerve deficit or sensory deficit.  Skin: Skin is warm and dry.  Psychiatric: She has a normal mood and affect. Her speech is normal.    ED Course  Procedures (including critical care time) Labs Review Labs Reviewed - No data to display Imaging Review No results found.  EKG Interpretation   None       MDM  No diagnosis found. **I have reviewed nursing notes, vital signs, and all appropriate lab and imaging results for this patient.* Patient presents to the emergency department with complaint of pain in redness of the left eye. She noticed" gunk" and that this early a.m. The eye was read most of the day until she used a product called Clear Eyes. She states after she took the Clear Eyes she still has increased redness of the left eye.  The examination is consistent with conjunctivitis.  Plan at this time is for the patient be placed on tobramycin eyedrops every 4 hours. Cool compresses to the right eye. Wash hands frequently. Use Tylenol or ibuprofen for soreness. Patient is to see her primary physician, or return to the emergency department if not improving.   Kathie Dike, PA-C 07/19/13 1654

## 2013-08-16 ENCOUNTER — Encounter: Payer: Self-pay | Admitting: Gastroenterology

## 2013-08-29 ENCOUNTER — Telehealth: Payer: Self-pay | Admitting: Family Medicine

## 2013-08-29 NOTE — Telephone Encounter (Signed)
Do you know what med she is taking about? If not I will call the pt back.

## 2013-08-29 NOTE — Telephone Encounter (Signed)
She probably means flagyl for BV.  Can we verify what symptoms she is having though?

## 2013-08-29 NOTE — Telephone Encounter (Signed)
Pharmacy is [facility] [address] Call back number is [phone] Pt is needing a refill on valgel??? Something about her body produces to much bacteria and Dr Tanya Nonespickard gives her this medication ever so often. She didn't have the bottle so she couldn't spell it for me

## 2013-08-30 NOTE — Telephone Encounter (Signed)
Ok flagyl 500 bid for 7 days.

## 2013-08-30 NOTE — Telephone Encounter (Signed)
She gets this every so many months after she has her period where she gets odiferous discharge. And she is having a small amount of that now and she likes to catch it before it gets worse. Ok to refill her flagyl?

## 2013-08-31 MED ORDER — METRONIDAZOLE 500 MG PO TABS: 500.0000 mg | ORAL_TABLET | Freq: Two times a day (BID) | ORAL | Status: DC

## 2013-08-31 NOTE — Telephone Encounter (Signed)
Med sent to pharm and pt aware per vm 

## 2013-09-06 ENCOUNTER — Encounter (HOSPITAL_COMMUNITY): Payer: Self-pay | Admitting: Emergency Medicine

## 2013-09-06 ENCOUNTER — Emergency Department (HOSPITAL_COMMUNITY): Payer: Medicaid Other

## 2013-09-06 ENCOUNTER — Emergency Department (HOSPITAL_COMMUNITY)
Admission: EM | Admit: 2013-09-06 | Discharge: 2013-09-06 | Disposition: A | Discharge status: Home / Self Care | Payer: Medicaid Other | Attending: Emergency Medicine | Admitting: Emergency Medicine

## 2013-09-06 DIAGNOSIS — S93402A Sprain of unspecified ligament of left ankle, initial encounter: Secondary | ICD-10-CM

## 2013-09-06 DIAGNOSIS — Z791 Long term (current) use of non-steroidal anti-inflammatories (NSAID): Secondary | ICD-10-CM | POA: Insufficient documentation

## 2013-09-06 DIAGNOSIS — S93409A Sprain of unspecified ligament of unspecified ankle, initial encounter: Secondary | ICD-10-CM | POA: Insufficient documentation

## 2013-09-06 DIAGNOSIS — Y929 Unspecified place or not applicable: Secondary | ICD-10-CM | POA: Insufficient documentation

## 2013-09-06 DIAGNOSIS — E669 Obesity, unspecified: Secondary | ICD-10-CM | POA: Insufficient documentation

## 2013-09-06 DIAGNOSIS — X500XXA Overexertion from strenuous movement or load, initial encounter: Secondary | ICD-10-CM | POA: Insufficient documentation

## 2013-09-06 DIAGNOSIS — F17200 Nicotine dependence, unspecified, uncomplicated: Secondary | ICD-10-CM | POA: Insufficient documentation

## 2013-09-06 DIAGNOSIS — Y9389 Activity, other specified: Secondary | ICD-10-CM | POA: Insufficient documentation

## 2013-09-06 DIAGNOSIS — Z8669 Personal history of other diseases of the nervous system and sense organs: Secondary | ICD-10-CM | POA: Insufficient documentation

## 2013-09-06 DIAGNOSIS — Z79899 Other long term (current) drug therapy: Secondary | ICD-10-CM | POA: Insufficient documentation

## 2013-09-06 DIAGNOSIS — Z8679 Personal history of other diseases of the circulatory system: Secondary | ICD-10-CM | POA: Insufficient documentation

## 2013-09-06 MED ORDER — IBUPROFEN 800 MG PO TABS: 800.0000 mg | ORAL_TABLET | Freq: Three times a day (TID) | ORAL | Status: DC

## 2013-09-06 MED ORDER — IBUPROFEN 800 MG PO TABS
800.0000 mg | ORAL_TABLET | Freq: Once | ORAL | Status: AC
  Administered 2013-09-06: 800 mg via ORAL
  Filled 2013-09-06: qty 1

## 2013-09-06 MED ORDER — OXYCODONE-ACETAMINOPHEN 5-325 MG PO TABS
1.0000 | ORAL_TABLET | Freq: Once | ORAL | Status: AC
  Administered 2013-09-06: 1 via ORAL
  Filled 2013-09-06: qty 1

## 2013-09-06 MED ORDER — OXYCODONE-ACETAMINOPHEN 5-325 MG PO TABS: 1.0000 | ORAL_TABLET | ORAL | Status: DC | PRN

## 2013-09-06 NOTE — Discharge Instructions (Signed)

## 2013-09-06 NOTE — ED Notes (Signed)
Pt c/o left ankle pain after twisting ankle stepping off step.

## 2013-09-09 NOTE — ED Provider Notes (Signed)
CSN: [id number]     Arrival date & time 09/06/13  2021 History   First MD Initiated Contact with Patient 09/06/13 2125     Chief Complaint  Patient presents with  . Ankle Pain     (Consider location/radiation/quality/duration/timing/severity/associated sxs/prior Treatment) HPI Comments: Stacy [name] is a 28 y.o. female who presents to the Emergency Department complaining of left ankle pain that began suddenly after a twisting injury.  Incident occurred several hours prior to ED arrival.  Patient c/o pain and swelling of the lateral ankle.  Pain is worse with weight bearing and she has taken NSAID w/o relief.  She denies numbness, laceration, or tenderness proximal to the ankle.  The history is provided by the patient.    Past Medical History  Diagnosis Date  . Obesity   . Carpal tunnel syndrome   . Migraine    Past Surgical History  Procedure Laterality Date  . Cholecystectomy    . Cesarean section    . Tubal ligation    . Wisdom tooth extraction    . Cesarean section    . Mouth surgery     Family History  Problem Relation Age of Onset  . Diabetes Mother   . Hypertension Father    History  Substance Use Topics  . Smoking status: Current Some Day Smoker -- 0.04 packs/day for 1.5 years    Types: Cigarettes    Last Attempt to Quit: 11/21/2010  . Smokeless tobacco: Never Used  . Alcohol Use: No   OB History   Grav Para Term Preterm Abortions TAB SAB Ect Mult Living   2 2 2       2      Review of Systems  Constitutional: Negative for fever and chills.  Genitourinary: Negative for dysuria and difficulty urinating.  Musculoskeletal: Positive for arthralgias and joint swelling.  Skin: Negative for color change and wound.  All other systems reviewed and are negative.      Allergies  Coconut flavor; Pineapple; and Vicodin  Home Medications   Current Outpatient Rx  Name  Route  Sig  Dispense  Refill  . ibuprofen (ADVIL,MOTRIN) 200 MG tablet   Oral   Take 800  mg by mouth every 6 (six) hours as needed for moderate pain.         . metroNIDAZOLE (FLAGYL) 500 MG tablet   Oral   Take 1 tablet (500 mg total) by mouth 2 (two) times daily. X 7 days   14 tablet   0   . ibuprofen (ADVIL,MOTRIN) 800 MG tablet   Oral   Take 1 tablet (800 mg total) by mouth 3 (three) times daily.   21 tablet   0   . oxyCODONE-acetaminophen (PERCOCET/ROXICET) 5-325 MG per tablet   Oral   Take 1 tablet by mouth every 4 (four) hours as needed for severe pain.   10 tablet   0    BP 123/62  Pulse 59  Temp(Src) 98.4 F (36.9 C) (Oral)  Resp 18  Ht 5\' 7"  (1.702 m)  Wt 230 lb (104.327 kg)  BMI 36.01 kg/m2  SpO2 100%  LMP 08/17/2013 Physical Exam  Nursing note and vitals reviewed. Constitutional: She is oriented to person, place, and time. She appears well-developed and well-nourished. No distress.  HENT:  Head: Normocephalic and atraumatic.  Cardiovascular: Normal rate, regular rhythm, normal heart sounds and intact distal pulses.   Pulmonary/Chest: Effort normal and breath sounds normal.  Musculoskeletal: She exhibits edema and tenderness.  Left ankle: She exhibits swelling. She exhibits normal range of motion, no ecchymosis, no deformity, no laceration and normal pulse. Tenderness. Lateral malleolus tenderness found. No head of 5th metatarsal tenderness found. Achilles tendon normal.       Feet:  Left lateral ankle is ttp, mild STS is present.   DP pulse is brisk,distal sensation intact.  No erythema, abrasion, bruising or bony deformity.  No proximal tenderness.  Neurological: She is alert and oriented to person, place, and time. She exhibits normal muscle tone. Coordination normal.  Skin: Skin is warm and dry.    ED Course  Procedures (including critical care time) Labs Review Labs Reviewed - No data to display Imaging Review Dg Ankle Complete Left  09/06/2013   CLINICAL DATA:  Pain.  EXAM: LEFT ANKLE COMPLETE - 3+ VIEW  COMPARISON:  Left foot  series 3/31 2010.  FINDINGS: No acute bony or joint abnormality identified. Tiny bony density noted along the anterior aspect of the distal tibial epiphysis is unchanged most likely a tiny old fracture.  IMPRESSION: No acute abnormality.   Electronically Signed   By: Maisie Fushomas  Register   On: 09/06/2013 21:18    EKG Interpretation None      MDM   Final diagnoses:  Sprain of left ankle   XR reviewed and discussed, likely sprain.  No proximal tenderness, compartments soft.    ASO splint applied.  Pain improved, remains NV intact  Pt agrees to RICE therapy, ibuprofen and percocet for pain.  Referral for orthopedics given.  Pt appears stable for d/c    Rockford Leinen L. Trisha Mangleriplett, PA-C 09/09/13 1154

## 2013-09-10 NOTE — ED Provider Notes (Signed)
Medical screening examination/treatment/procedure(s) were performed by non-physician practitioner and as supervising physician I was immediately available for consultation/collaboration.   EKG Interpretation None       Donnetta HutchingBrian Tyr Franca, MD 09/10/13 [phone]

## 2013-09-26 ENCOUNTER — Encounter: Payer: Self-pay | Admitting: Family Medicine

## 2013-09-26 ENCOUNTER — Ambulatory Visit (INDEPENDENT_AMBULATORY_CARE_PROVIDER_SITE_OTHER): Payer: Medicaid Other | Admitting: Family Medicine

## 2013-09-26 VITALS — BP 124/86 | HR 66 | Temp 98.1°F | Resp 14 | Ht 64.5 in | Wt 222.0 lb

## 2013-09-26 DIAGNOSIS — M546 Pain in thoracic spine: Secondary | ICD-10-CM

## 2013-09-26 MED ORDER — CYCLOBENZAPRINE HCL 10 MG PO TABS: 10.0000 mg | ORAL_TABLET | Freq: Two times a day (BID) | ORAL | Status: DC | PRN

## 2013-09-26 MED ORDER — TRAMADOL HCL 50 MG PO TABS: 50.0000 mg | ORAL_TABLET | Freq: Three times a day (TID) | ORAL | Status: DC | PRN

## 2013-09-26 NOTE — Patient Instructions (Addendum)
Muscle relaxer at bedtime, you can use during the day if no driving or operative any heavy machinary Ultram for pain   Muscle Strain A muscle strain is an injury that occurs when a muscle is stretched beyond its normal length. Usually a small number of muscle fibers are torn when this happens. Muscle strain is rated in degrees. First-degree strains have the least amount of muscle fiber tearing and pain. Second-degree and third-degree strains have increasingly more tearing and pain.  Usually, recovery from muscle strain takes 1 2 weeks. Complete healing takes 5 6 weeks.  CAUSES  Muscle strain happens when a sudden, violent force placed on a muscle stretches it too far. This may occur with lifting, sports, or a fall.  RISK FACTORS Muscle strain is especially common in athletes.  SIGNS AND SYMPTOMS At the site of the muscle strain, there may be:  Pain.  Bruising.  Swelling.  Difficulty using the muscle due to pain or lack of normal function. DIAGNOSIS  Your health care provider will perform a physical exam and ask about your medical history. TREATMENT  Often, the best treatment for a muscle strain is resting, icing, and applying cold compresses to the injured area.  HOME CARE INSTRUCTIONS   Use the PRICE method of treatment to promote muscle healing during the first 2 3 days after your injury. The PRICE method involves:  Protecting the muscle from being injured again.  Restricting your activity and resting the injured body part.  Icing your injury. To do this, put ice in a plastic bag. Place a towel between your skin and the bag. Then, apply the ice and leave it on from 15 20 minutes each hour. After the third day, switch to moist heat packs.  Apply compression to the injured area with a splint or elastic bandage. Be careful not to wrap it too tightly. This may interfere with blood circulation or increase swelling.  Elevate the injured body part above the level of your heart as  often as you can.  Only take over-the-counter or prescription medicines for pain, discomfort, or fever as directed by your health care provider.  Warming up prior to exercise helps to prevent future muscle strains. SEEK MEDICAL CARE IF:   You have increasing pain or swelling in the injured area.  You have numbness, tingling, or a significant loss of strength in the injured area. MAKE SURE YOU:   Understand these instructions.  Will watch your condition.  Will get help right away if you are not doing well or get worse. Document Released: 06/08/2005 Document Revised: 03/29/2013 Document Reviewed: 01/05/2013 [facility] Patient Information 2014 [employer] [place].

## 2013-09-26 NOTE — Progress Notes (Signed)
Patient ID: Stacy [name], female   DOB: 11/06/1985, 28 y.o.   MRN: [id number]   Subjective:    Patient ID: Stacy [name], female    DOB: 01/28/1986, 28 y.o.   MRN: [id number]  Patient presents for Back Pain  patient here with mid lower back pain for the past few days. 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 Afterward she began having some back pain. She's tried a heating pad as well as some ibuprofen with no improvement. The pain is nonradiating but on both sides of her mid back. No change in bowel or bladder no weakness otherwise.     Review Of Systems:  GEN- denies fatigue, fever, weight loss,weakness, recent illness MSK- +joint pain,+ muscle aches, injury Neuro- denies headache, dizziness, syncope, seizure activity       Objective:    BP 124/86  Pulse 66  Temp(Src) 98.1 F (36.7 C) (Oral)  Resp 14  Ht 5' 4.5" (1.638 m)  Wt 222 lb (100.699 kg)  BMI 37.53 kg/m2  LMP 09/11/2013 GEN- NAD, alert and oriented x3 CVS- RRR, no murmur RESP-CTAB MSK- Spine NT, TTP bilat paraspinals low thoracic region, + spasm, neg SLR, FROM HIPS, Neuro- motor equal LE, Sensation in tact, normal tone Ext- No edema Pulses- Radial 2+        Assessment & Plan:      Problem List Items Addressed This Visit   None    Visit Diagnoses   Back pain, thoracic    -  Primary    Musculoskeletal pain. And will give her restrictions for one week from lifting. Flexeril given a short course of ultram, no imaging needed    Relevant Medications       cyclobenzaprine (FLEXERIL) tablet       traMADol (ULTRAM) tablet 50 mg       Note: This dictation was prepared with Dragon dictation along with smaller phrase technology. Any transcriptional errors that result from this process are unintentional.

## 2013-11-21 ENCOUNTER — Ambulatory Visit (INDEPENDENT_AMBULATORY_CARE_PROVIDER_SITE_OTHER): Payer: Medicaid Other | Admitting: Family Medicine

## 2013-11-21 ENCOUNTER — Ambulatory Visit (HOSPITAL_COMMUNITY)
Admission: RE | Admit: 2013-11-21 | Discharge: 2013-11-21 | Disposition: A | Discharge status: Home / Self Care | Payer: Medicaid Other | Source: Ambulatory Visit | Attending: Family Medicine | Admitting: Family Medicine

## 2013-11-21 ENCOUNTER — Encounter: Payer: Self-pay | Admitting: Family Medicine

## 2013-11-21 VITALS — BP 128/70 | HR 64 | Temp 97.9°F | Resp 16 | Ht 64.0 in | Wt 231.0 lb

## 2013-11-21 DIAGNOSIS — M549 Dorsalgia, unspecified: Secondary | ICD-10-CM | POA: Diagnosis not present

## 2013-11-21 DIAGNOSIS — M439 Deforming dorsopathy, unspecified: Secondary | ICD-10-CM

## 2013-11-21 DIAGNOSIS — R109 Unspecified abdominal pain: Secondary | ICD-10-CM

## 2013-11-21 LAB — URINALYSIS, ROUTINE W REFLEX MICROSCOPIC
Bilirubin Urine: NEGATIVE
Glucose, UA: NEGATIVE mg/dL
Ketones, ur: NEGATIVE mg/dL
Leukocytes, UA: NEGATIVE
NITRITE: NEGATIVE
Protein, ur: NEGATIVE mg/dL
Urobilinogen, UA: 0.2 mg/dL (ref 0.0–1.0)
pH: 5.5 (ref 5.0–8.0)

## 2013-11-21 LAB — URINALYSIS, MICROSCOPIC ONLY
BACTERIA UA: NONE SEEN
CRYSTALS: NONE SEEN
Casts: NONE SEEN

## 2013-11-21 MED ORDER — HYDROCODONE-ACETAMINOPHEN 5-325 MG PO TABS: 1.0000 | ORAL_TABLET | Freq: Four times a day (QID) | ORAL | Status: DC | PRN

## 2013-11-21 MED ORDER — IBUPROFEN 800 MG PO TABS: 800.0000 mg | ORAL_TABLET | Freq: Three times a day (TID) | ORAL | Status: DC

## 2013-11-21 NOTE — Progress Notes (Signed)
Patient ID: Stacy [name], female   DOB: 05-Jan-1986, 28 y.o.   MRN: [id number]   Subjective:    Patient ID: Stacy [name], female    DOB: 11-27-1985, 28 y.o.   MRN: [id number]  Patient presents for Possible kidney stones  patient here with bilateral flank pain she states is worse on the left side compared to the right. Of note she was evaluated for back pain about 2 months ago as well. She states this feels different. She thought she may have a kidney stone that she's not having any dysuria any radiation of her pain no blood in the urine. She did recently complete her menstrual cycle. She's not had any nausea vomiting or abdominal pain associated. No specific injury. She did take some ibuprofen but ran out of this. She states that tramadol Flexeril has not helped.    Review Of Systems:  GEN- denies fatigue, fever, weight loss,weakness, recent illness CVS- denies chest pain, palpitations RESP- denies SOB, cough, wheeze ABD- denies N/V, change in stools, abd pain GU- denies dysuria, hematuria, dribbling, incontinence MSK- +joint pain, muscle aches, injury Neuro- denies headache, dizziness, syncope, seizure activity       Objective:    BP 128/70  Pulse 64  Temp(Src) 97.9 F (36.6 C) (Oral)  Resp 16  Ht 5\' 4"  (1.626 m)  Wt 231 lb (104.781 kg)  BMI 39.63 kg/m2  LMP 11/17/2013 GEN- NAD, alert and oriented x3 CVS- RRR, no murmur RESP-CTAB ABD-NABS,soft,NT,ND, equivocal CVA tenderness MSK- Spine TTP thoracic TTP bilat paraspinals low thoracic region, + spasm, neg SLR, FROM HIPS, Neuro- motor equal LE, Sensation in tact, normal tone Ext- No edema Pulses- Radial 2+         Assessment & Plan:      Problem List Items Addressed This Visit   None    Visit Diagnoses   Flank pain    -  Primary    Relevant Orders       Urinalysis, Routine w reflex microscopic (Completed)    Back pain        Relevant Medications       ibuprofen (MOTRIN) tablet       HYDROcodone-acetaminophen  (NORCO/VICODIN) 5-325 MG per tablet    Other Relevant Orders       DG Lumbar Spine Complete (Completed)       DG Thoracic Spine W/Swimmers (Completed)       Note: This dictation was prepared with Dragon dictation along with smaller phrase technology. Any transcriptional errors that result from this process are unintentional.

## 2013-11-21 NOTE — Patient Instructions (Signed)
Get xrays done Urine sample is normal Pain medication short term  F/U as needed

## 2013-11-24 ENCOUNTER — Ambulatory Visit (HOSPITAL_COMMUNITY): Payer: Medicaid Other

## 2013-11-24 ENCOUNTER — Ambulatory Visit (HOSPITAL_COMMUNITY)
Admission: RE | Admit: 2013-11-24 | Discharge: 2013-11-24 | Disposition: A | Discharge status: Home / Self Care | Payer: Medicaid Other | Source: Ambulatory Visit | Attending: Family Medicine | Admitting: Family Medicine

## 2013-11-24 ENCOUNTER — Other Ambulatory Visit (HOSPITAL_COMMUNITY): Payer: Medicaid Other

## 2013-11-24 DIAGNOSIS — M549 Dorsalgia, unspecified: Secondary | ICD-10-CM

## 2013-11-24 DIAGNOSIS — M546 Pain in thoracic spine: Secondary | ICD-10-CM | POA: Insufficient documentation

## 2013-11-24 DIAGNOSIS — IMO0002 Reserved for concepts with insufficient information to code with codable children: Secondary | ICD-10-CM | POA: Diagnosis not present

## 2013-11-24 DIAGNOSIS — M439 Deforming dorsopathy, unspecified: Secondary | ICD-10-CM

## 2013-12-13 ENCOUNTER — Encounter (HOSPITAL_COMMUNITY): Payer: Self-pay | Admitting: Emergency Medicine

## 2013-12-13 ENCOUNTER — Emergency Department (HOSPITAL_COMMUNITY)
Admission: EM | Admit: 2013-12-13 | Discharge: 2013-12-13 | Disposition: A | Discharge status: Home / Self Care | Payer: Medicaid Other | Attending: Emergency Medicine | Admitting: Emergency Medicine

## 2013-12-13 DIAGNOSIS — R112 Nausea with vomiting, unspecified: Secondary | ICD-10-CM

## 2013-12-13 DIAGNOSIS — R109 Unspecified abdominal pain: Secondary | ICD-10-CM | POA: Insufficient documentation

## 2013-12-13 DIAGNOSIS — I498 Other specified cardiac arrhythmias: Secondary | ICD-10-CM | POA: Insufficient documentation

## 2013-12-13 DIAGNOSIS — R001 Bradycardia, unspecified: Secondary | ICD-10-CM

## 2013-12-13 DIAGNOSIS — Z87891 Personal history of nicotine dependence: Secondary | ICD-10-CM | POA: Insufficient documentation

## 2013-12-13 DIAGNOSIS — Z8679 Personal history of other diseases of the circulatory system: Secondary | ICD-10-CM | POA: Insufficient documentation

## 2013-12-13 DIAGNOSIS — Z3202 Encounter for pregnancy test, result negative: Secondary | ICD-10-CM | POA: Insufficient documentation

## 2013-12-13 DIAGNOSIS — E669 Obesity, unspecified: Secondary | ICD-10-CM | POA: Insufficient documentation

## 2013-12-13 LAB — CBC WITH DIFFERENTIAL/PLATELET
Basophils Absolute: 0 10*3/uL (ref 0.0–0.1)
Basophils Relative: 0 % (ref 0–1)
Eosinophils Absolute: 0.3 10*3/uL (ref 0.0–0.7)
Eosinophils Relative: 4 % (ref 0–5)
HCT: 36.6 % (ref 36.0–46.0)
Hemoglobin: 12.9 g/dL (ref 12.0–15.0)
LYMPHS ABS: 2.7 10*3/uL (ref 0.7–4.0)
LYMPHS PCT: 37 % (ref 12–46)
MCH: 28.2 pg (ref 26.0–34.0)
MCHC: 35.2 g/dL (ref 30.0–36.0)
MCV: 79.9 fL (ref 78.0–100.0)
Monocytes Absolute: 0.5 10*3/uL (ref 0.1–1.0)
Monocytes Relative: 6 % (ref 3–12)
NEUTROS PCT: 53 % (ref 43–77)
Neutro Abs: 3.9 10*3/uL (ref 1.7–7.7)
Platelets: 247 10*3/uL (ref 150–400)
RBC: 4.58 MIL/uL (ref 3.87–5.11)
RDW: 13.3 % (ref 11.5–15.5)
WBC: 7.4 10*3/uL (ref 4.0–10.5)

## 2013-12-13 LAB — URINALYSIS, ROUTINE W REFLEX MICROSCOPIC
Bilirubin Urine: NEGATIVE
GLUCOSE, UA: NEGATIVE mg/dL
HGB URINE DIPSTICK: NEGATIVE
Ketones, ur: NEGATIVE mg/dL
Nitrite: NEGATIVE
Protein, ur: NEGATIVE mg/dL
Urobilinogen, UA: 0.2 mg/dL (ref 0.0–1.0)
pH: 6 (ref 5.0–8.0)

## 2013-12-13 LAB — URINE MICROSCOPIC-ADD ON

## 2013-12-13 LAB — COMPREHENSIVE METABOLIC PANEL
ALK PHOS: 43 U/L (ref 39–117)
ALT: 13 U/L (ref 0–35)
AST: 15 U/L (ref 0–37)
Albumin: 3.4 g/dL — ABNORMAL LOW (ref 3.5–5.2)
BUN: 9 mg/dL (ref 6–23)
CALCIUM: 8.5 mg/dL (ref 8.4–10.5)
CO2: 23 meq/L (ref 19–32)
Chloride: 106 mEq/L (ref 96–112)
Creatinine, Ser: 0.55 mg/dL (ref 0.50–1.10)
GFR calc non Af Amer: >90 mL/min (ref >90)
GLUCOSE: 97 mg/dL (ref 70–99)
POTASSIUM: 3.7 meq/L (ref 3.7–5.3)
Sodium: 141 mEq/L (ref 137–147)
TOTAL PROTEIN: 6.5 g/dL (ref 6.0–8.3)
Total Bilirubin: 0.5 mg/dL (ref 0.3–1.2)

## 2013-12-13 LAB — LIPASE, BLOOD: Lipase: 24 U/L (ref 11–59)

## 2013-12-13 LAB — PREGNANCY, URINE: PREG TEST UR: NEGATIVE

## 2013-12-13 MED ORDER — PROMETHAZINE HCL 25 MG/ML IJ SOLN
12.5000 mg | Freq: Once | INTRAMUSCULAR | Status: AC
  Administered 2013-12-13: 12.5 mg via INTRAVENOUS
  Filled 2013-12-13: qty 1

## 2013-12-13 MED ORDER — ONDANSETRON HCL 4 MG/2ML IJ SOLN
4.0000 mg | Freq: Once | INTRAMUSCULAR | Status: AC
Start: 2013-12-13 — End: 2013-12-13
  Administered 2013-12-13: 4 mg via INTRAVENOUS
  Filled 2013-12-13: qty 2

## 2013-12-13 MED ORDER — PROMETHAZINE HCL 25 MG PO TABS: 25.0000 mg | ORAL_TABLET | Freq: Four times a day (QID) | ORAL | Status: DC | PRN

## 2013-12-13 MED ORDER — SODIUM CHLORIDE 0.9 % IV BOLUS (SEPSIS)
1000.0000 mL | Freq: Once | INTRAVENOUS | Status: AC
  Administered 2013-12-13: 1000 mL via INTRAVENOUS

## 2013-12-13 MED ORDER — SODIUM CHLORIDE 0.9 % IV SOLN
1000.0000 mL | Freq: Once | INTRAVENOUS | Status: AC
  Administered 2013-12-13: 1000 mL via INTRAVENOUS

## 2013-12-13 MED ORDER — ONDANSETRON HCL 4 MG PO TABS: 4.0000 mg | ORAL_TABLET | Freq: Four times a day (QID) | ORAL | Status: DC

## 2013-12-13 NOTE — ED Notes (Signed)
PA Idol informed of Orthostatic VS, and pt c/o of weakness and dizziness upon ambulating. NS 1000cc bolus ordered.

## 2013-12-13 NOTE — ED Notes (Signed)
Patient ambulated with steady gait. States she feels weak.

## 2013-12-13 NOTE — ED Notes (Signed)
Pt still co nausea, will medicate with IV Phenergan per PA order.

## 2013-12-13 NOTE — ED Provider Notes (Signed)
CSN: [id number]     Arrival date & time 12/13/13  0736 History   First MD Initiated Contact with Patient 12/13/13 [phone]     Chief Complaint  Patient presents with  . Emesis     (Consider location/radiation/quality/duration/timing/severity/associated sxs/prior Treatment) HPI Comments: Stacy [name] is a 28 y.o. Female presenting with a 2 day history of nausea and vomiting.  She developed nausea 2 nights ago, then started having non bloody clear emesis yesterday morning.  She describes right flank and ruq pain only during active vomiting, no pain at rest.  She denies diarrhea and fevers, has had no dysuria, vaginal discharge, back pain, hematuria.  Her last bm was yesterday and normal.  She was at the beach this weekend and reports she and her boyfriend ate at a restaurant 2 mornings ago before traveling home.  He feels slightly nauseated as well without emesis or other complaint.  She has taken ibuprofen, no antiemetics prior to arrival.  She has been able to maintain small sips of water only.        The history is provided by the patient.    Past Medical History  Diagnosis Date  . Obesity   . Carpal tunnel syndrome   . Migraine    Past Surgical History  Procedure Laterality Date  . Cholecystectomy    . Cesarean section    . Tubal ligation    . Wisdom tooth extraction    . Cesarean section    . Mouth surgery     Family History  Problem Relation Age of Onset  . Diabetes Mother   . Hypertension Father    History  Substance Use Topics  . Smoking status: Former Smoker -- 0.04 packs/day for 1.5 years    Types: Cigarettes    Quit date: 11/21/2010  . Smokeless tobacco: Never Used  . Alcohol Use: No   OB History   Grav Para Term Preterm Abortions TAB SAB Ect Mult Living   2 2 2       2      Review of Systems  Constitutional: Negative for fever and chills.  HENT: Negative for congestion and sore throat.   Eyes: Negative.   Respiratory: Negative for chest tightness and  shortness of breath.   Cardiovascular: Negative for chest pain.  Gastrointestinal: Positive for nausea, vomiting and abdominal pain. Negative for diarrhea and constipation.  Genitourinary: Negative.   Musculoskeletal: Negative for arthralgias, joint swelling and neck pain.  Skin: Negative.  Negative for rash and wound.  Neurological: Negative for dizziness, weakness, light-headedness, numbness and headaches.  Psychiatric/Behavioral: Negative.       Allergies  Coconut flavor and Pineapple  Home Medications   Prior to Admission medications   Medication Sig Start Date End Date Taking? Authorizing Provider  ibuprofen (ADVIL,MOTRIN) 800 MG tablet Take 1 tablet (800 mg total) by mouth 3 (three) times daily. 11/21/13  Yes Salley ScarletKawanta F Eldorado, MD  promethazine (PHENERGAN) 25 MG tablet Take 1 tablet (25 mg total) by mouth every 6 (six) hours as needed for nausea or vomiting. 12/13/13   Burgess AmorJulie Idol, PA-C   BP 123/70  Pulse 50  Temp(Src) 98.1 F (36.7 C) (Oral)  Resp 16  Ht 5\' 7"  (1.702 m)  Wt 230 lb (104.327 kg)  BMI 36.01 kg/m2  SpO2 100%  LMP 11/17/2013 Physical Exam  Nursing note and vitals reviewed. Constitutional: She appears well-developed and well-nourished.  HENT:  Head: Normocephalic and atraumatic.  Mucous membranes slightly dry.  Eyes: Conjunctivae are  normal.  Neck: Normal range of motion.  Cardiovascular: Normal rate, regular rhythm, normal heart sounds and intact distal pulses.   Pulmonary/Chest: Effort normal and breath sounds normal. She has no wheezes.  Abdominal: Soft. Bowel sounds are normal. There is no tenderness. There is no rebound and no guarding.  Musculoskeletal: Normal range of motion.  Neurological: She is alert.  Skin: Skin is warm and dry.  Psychiatric: She has a normal mood and affect.    ED Course  Procedures (including critical care time) Labs Review Labs Reviewed  COMPREHENSIVE METABOLIC PANEL - Abnormal; Notable for the following:    Albumin 3.4  (*)    All other components within normal limits  URINALYSIS, ROUTINE W REFLEX MICROSCOPIC - Abnormal; Notable for the following:    Specific Gravity, Urine >1.030 (*)    Leukocytes, UA TRACE (*)    All other components within normal limits  URINE MICROSCOPIC-ADD ON - Abnormal; Notable for the following:    Squamous Epithelial / LPF MANY (*)    Bacteria, UA MANY (*)    All other components within normal limits  CBC WITH DIFFERENTIAL  LIPASE, BLOOD  PREGNANCY, URINE    Imaging Review No results found.   EKG Interpretation None      MDM   Final diagnoses:  Non-intractable vomiting with nausea, vomiting of unspecified type  Bradycardia    Patients labs and/or radiological studies were viewed and considered during the medical decision making and disposition process. Pt with vomiting and nausea, abdominal exam is benign, no acute abdomen.  She was re-examined prior to dc and abdominal exam is unchanged.  She was given 2 liters of fluid while here with improvement in sx, also maintained po challenge, despite still with complaint of nausea.  She was given phenergan in place of zofran.  Labs unremarkable,  Slightly dehydrated, bacturia, but with many squamous cells, no wbc's on urine, and no urinary sx - contaminated specimen - no abx indicated.  Encouraged rest, increased fluids, f/u with pcp within 1 week, returning here for any worsened sx. It was noted that patient was bradycardic during ed visit.  She is not orthostatic, denies cp.      Burgess AmorJulie Idol, PA-C 12/13/13 2334

## 2013-12-13 NOTE — ED Notes (Signed)
Patient with no complaints at this time. Respirations even and unlabored. Skin warm/dry. Discharge instructions reviewed with patient at this time. Patient given opportunity to voice concerns/ask questions. IV removed per policy and band-aid applied to site. Patient discharged at this time and left Emergency Department with steady gait.  

## 2013-12-13 NOTE — ED Notes (Addendum)
Into assess patient. HR mid to high 40's. BP stable. Pt has no complaints other than intermittent nasaeu. Pt placed on bedside monitor. PA Idol informed. Orders to walk pt around ED and complete Orthostatic VS received.

## 2013-12-13 NOTE — Discharge Instructions (Signed)
Make sure you are drinking plenty of fluids.  Rest, get rechecked if your symptoms persist.  Call your primary doctor for an office visit within the next week.

## 2013-12-13 NOTE — ED Notes (Signed)
Pt reports to ED with complaint of "not being able to keep anything down" since yesterday. Denies diarrhea. Pt complains of right sided abdominal pain.

## 2013-12-14 NOTE — ED Provider Notes (Signed)
Medical screening examination/treatment/procedure(s) were conducted as a shared visit with non-physician practitioner(s) and myself.  I personally evaluated the patient during the encounter.   EKG Interpretation None     No acute abdomen. White count normal. Appears well  Donnetta HutchingBrian Cook, MD 12/14/13 [phone]

## 2013-12-19 ENCOUNTER — Telehealth: Payer: Self-pay | Admitting: Family Medicine

## 2013-12-19 NOTE — Telephone Encounter (Signed)
?   OK to Refill  

## 2013-12-19 NOTE — Telephone Encounter (Signed)
Patient requesting refill on her valtrex if possible  [facility]  [phone]

## 2013-12-19 NOTE — Telephone Encounter (Signed)
ok 

## 2013-12-20 MED ORDER — VALACYCLOVIR HCL 500 MG PO TABS: 500.0000 mg | ORAL_TABLET | Freq: Every day | ORAL | Status: DC

## 2013-12-20 NOTE — Telephone Encounter (Signed)
Med sent to pharm 

## 2014-01-08 ENCOUNTER — Emergency Department (HOSPITAL_COMMUNITY)
Admission: EM | Admit: 2014-01-08 | Discharge: 2014-01-09 | Disposition: A | Discharge status: Home / Self Care | Payer: Medicaid Other | Attending: Emergency Medicine | Admitting: Emergency Medicine

## 2014-01-08 ENCOUNTER — Emergency Department (HOSPITAL_COMMUNITY): Payer: Medicaid Other

## 2014-01-08 ENCOUNTER — Encounter (HOSPITAL_COMMUNITY): Payer: Self-pay | Admitting: Emergency Medicine

## 2014-01-08 DIAGNOSIS — E669 Obesity, unspecified: Secondary | ICD-10-CM | POA: Insufficient documentation

## 2014-01-08 DIAGNOSIS — Z8669 Personal history of other diseases of the nervous system and sense organs: Secondary | ICD-10-CM | POA: Insufficient documentation

## 2014-01-08 DIAGNOSIS — Z8679 Personal history of other diseases of the circulatory system: Secondary | ICD-10-CM | POA: Insufficient documentation

## 2014-01-08 DIAGNOSIS — Y9389 Activity, other specified: Secondary | ICD-10-CM | POA: Insufficient documentation

## 2014-01-08 DIAGNOSIS — Z87891 Personal history of nicotine dependence: Secondary | ICD-10-CM | POA: Insufficient documentation

## 2014-01-08 DIAGNOSIS — S6990XA Unspecified injury of unspecified wrist, hand and finger(s), initial encounter: Secondary | ICD-10-CM | POA: Diagnosis present

## 2014-01-08 DIAGNOSIS — S6980XA Other specified injuries of unspecified wrist, hand and finger(s), initial encounter: Secondary | ICD-10-CM | POA: Diagnosis present

## 2014-01-08 DIAGNOSIS — Z79899 Other long term (current) drug therapy: Secondary | ICD-10-CM | POA: Insufficient documentation

## 2014-01-08 DIAGNOSIS — IMO0002 Reserved for concepts with insufficient information to code with codable children: Secondary | ICD-10-CM | POA: Insufficient documentation

## 2014-01-08 DIAGNOSIS — S63659A Sprain of metacarpophalangeal joint of unspecified finger, initial encounter: Secondary | ICD-10-CM | POA: Insufficient documentation

## 2014-01-08 DIAGNOSIS — Y929 Unspecified place or not applicable: Secondary | ICD-10-CM | POA: Insufficient documentation

## 2014-01-08 DIAGNOSIS — S63619A Unspecified sprain of unspecified finger, initial encounter: Secondary | ICD-10-CM

## 2014-01-08 NOTE — ED Notes (Signed)
Patient complaining of pain to left middle and left ring finger. Reports injured fingers approximately 15 minutes when playing with daughter and daughter kicked fingers.

## 2014-01-09 MED ORDER — ACETAMINOPHEN 500 MG PO TABS
1000.0000 mg | ORAL_TABLET | Freq: Once | ORAL | Status: AC
  Administered 2014-01-09: 1000 mg via ORAL
  Filled 2014-01-09: qty 2

## 2014-01-09 MED ORDER — IBUPROFEN 800 MG PO TABS: 800.0000 mg | ORAL_TABLET | Freq: Three times a day (TID) | ORAL | Status: DC

## 2014-01-09 MED ORDER — KETOROLAC TROMETHAMINE 10 MG PO TABS
10.0000 mg | ORAL_TABLET | Freq: Once | ORAL | Status: AC
  Administered 2014-01-09: 10 mg via ORAL
  Filled 2014-01-09: qty 1

## 2014-01-09 MED ORDER — ACETAMINOPHEN-CODEINE #3 300-30 MG PO TABS: 1.0000 | ORAL_TABLET | Freq: Four times a day (QID) | ORAL | Status: DC | PRN

## 2014-01-09 NOTE — ED Provider Notes (Signed)
CSN: [id number]     Arrival date & time 01/08/14  2252 History   First MD Initiated Contact with Patient 01/08/14 2320     Chief Complaint  Patient presents with  . Finger Injury     (Consider location/radiation/quality/duration/timing/severity/associated sxs/prior Treatment) HPI Comments: Patient states he and her girls were playing when she accidentally got kicked in the left hand. The patient states that she sustained injury to the left middle and ring finger. This occurred approximately 15-20 minutes prior to arrival in the emergency department. His been no previous injury or procedures involving the left hand. Patient denies any bleeding disorders. No other injury reported, in particular no injury to the wrist, forearm, elbow or shoulder.  The history is provided by the patient.    Past Medical History  Diagnosis Date  . Obesity   . Carpal tunnel syndrome   . Migraine    Past Surgical History  Procedure Laterality Date  . Cholecystectomy    . Cesarean section    . Tubal ligation    . Wisdom tooth extraction    . Cesarean section    . Mouth surgery     Family History  Problem Relation Age of Onset  . Diabetes Mother   . Hypertension Father    History  Substance Use Topics  . Smoking status: Former Smoker -- 0.04 packs/day for 1.5 years    Types: Cigarettes    Quit date: 11/21/2010  . Smokeless tobacco: Never Used  . Alcohol Use: No   OB History   Grav Para Term Preterm Abortions TAB SAB Ect Mult Living   2 2 2       2      Review of Systems  Constitutional: Negative for activity change.       All ROS Neg except as noted in HPI  HENT: Negative for nosebleeds.   Eyes: Negative for photophobia and discharge.  Respiratory: Negative for cough, shortness of breath and wheezing.   Cardiovascular: Negative for chest pain and palpitations.  Gastrointestinal: Negative for abdominal pain and blood in stool.  Genitourinary: Negative for dysuria, frequency and hematuria.   Musculoskeletal: Negative for arthralgias, back pain and neck pain.  Skin: Negative.   Neurological: Negative for dizziness, seizures and speech difficulty.  Psychiatric/Behavioral: Negative for hallucinations and confusion.      Allergies  Coconut flavor and Pineapple  Home Medications   Prior to Admission medications   Medication Sig Start Date End Date Taking? Authorizing Provider  acetaminophen-codeine (TYLENOL #3) 300-30 MG per tablet Take 1-2 tablets by mouth every 6 (six) hours as needed for moderate pain. 01/09/14   Kathie Dike, PA-C  ibuprofen (ADVIL,MOTRIN) 800 MG tablet Take 1 tablet (800 mg total) by mouth 3 (three) times daily. 11/21/13   Salley Scarlet, MD  ibuprofen (ADVIL,MOTRIN) 800 MG tablet Take 1 tablet (800 mg total) by mouth 3 (three) times daily. 01/09/14   Kathie Dike, PA-C  promethazine (PHENERGAN) 25 MG tablet Take 1 tablet (25 mg total) by mouth every 6 (six) hours as needed for nausea or vomiting. 12/13/13   Burgess Amor, PA-C  valACYclovir (VALTREX) 500 MG tablet Take 1 tablet (500 mg total) by mouth daily. And 1 tab po bid prn outbreak 12/20/13   Donita Brooks, MD   BP 109/61  Pulse 60  Temp(Src) 98 F (36.7 C) (Oral)  Resp 16  Ht 5\' 7"  (1.702 m)  Wt 234 lb (106.142 kg)  BMI 36.64 kg/m2  SpO2 99%  LMP 12/17/2013 Physical Exam  Nursing note and vitals reviewed. Constitutional: She is oriented to person, place, and time. She appears well-developed and well-nourished.  Non-toxic appearance.  HENT:  Head: Normocephalic.  Right Ear: Tympanic membrane and external ear normal.  Left Ear: Tympanic membrane and external ear normal.  Eyes: EOM and lids are normal. Pupils are equal, round, and reactive to light.  Neck: Normal range of motion. Neck supple. Carotid bruit is not present.  Cardiovascular: Normal rate, regular rhythm, normal heart sounds, intact distal pulses and normal pulses.   Pulmonary/Chest: Breath sounds normal. No respiratory  distress.  Abdominal: Soft. Bowel sounds are normal. There is no tenderness. There is no guarding.  Musculoskeletal: Normal range of motion.  There is full range of motion of the left shoulder elbow and wrist. There is pain to palpation and movement of the left middle and ring fingers. Capillary refill is less than 2 seconds. There is no visible deformity.  Lymphadenopathy:       Head (right side): No submandibular adenopathy present.       Head (left side): No submandibular adenopathy present.    She has no cervical adenopathy.  Neurological: She is alert and oriented to person, place, and time. She has normal strength. No cranial nerve deficit or sensory deficit.  Skin: Skin is warm and dry.  Psychiatric: She has a normal mood and affect. Her speech is normal.    ED Course  SPLINT APPLICATION Date/Time: 01/09/2014 12:40 AM Performed by: Kathie DikeBRYANT, Krystn Dermody M Authorized by: Kathie DikeBRYANT, Emelia Sandoval M Consent: Verbal consent obtained. Risks and benefits: risks, benefits and alternatives were discussed Consent given by: patient Patient understanding: patient states understanding of the procedure being performed Patient identity confirmed: arm band Time out: Immediately prior to procedure a "time out" was called to verify the correct patient, procedure, equipment, support staff and site/side marked as required. Location details: left long finger Splint type: dynamic finger Supplies used: cotton padding (2 inch kling.) Post-procedure: The splinted body part was neurovascularly unchanged following the procedure. Patient tolerance: Patient tolerated the procedure well with no immediate complications.   (including critical care time) Labs Review Labs Reviewed - No data to display  Imaging Review Dg Hand Complete Left  01/08/2014   CLINICAL DATA:  Hyperextension injury to the fingers of the left hand, with left hand pain, particularly at the middle and ring fingers.  EXAM: LEFT HAND - COMPLETE 3+ VIEW   COMPARISON:  Left index finger radiographs performed 02/11/2005, and left forearm radiographs from 10/04/2012  FINDINGS: There is no evidence of fracture or dislocation. The joint spaces are preserved; the soft tissues are unremarkable in appearance. The carpal rows are intact, and demonstrate normal alignment.  IMPRESSION: No evidence of fracture or dislocation.   Electronically Signed   By: Roanna RaiderJeffery  Chang M.D.   On: 01/08/2014 23:54     EKG Interpretation None      MDM X-ray of the left hand is negative for fracture or dislocation. A buddy tape splint was applied by me. Patient will use an ice pack. Prescription for Tylenol codeine, and ibuprofen 800 mg given to the patient. The patient is to see Dr. Romeo AppleHarrison for additional evaluation if not improving.    Final diagnoses:  Finger sprain, initial encounter    *I have reviewed nursing notes, vital signs, and all appropriate lab and imaging results for this patient.**    Kathie DikeHobson M Akshat Minehart, PA-C 01/09/14 0039  Kathie DikeHobson M Eileene Kisling, PA-C 01/09/14 [phone]

## 2014-01-09 NOTE — Discharge Instructions (Signed)
Finger Sprain °A finger sprain happens when the bands of tissue that hold the finger bones together (ligaments) stretch too much and tear. °HOME CARE °· Keep your injured finger raised (elevated) when possible. °· Put ice on the injured area, twice a day, for 2 to 3 days. °¨ Put ice in a plastic bag. °¨ Place a towel between your skin and the bag. °¨ Leave the ice on for 15 minutes. °· Only take medicine as told by your doctor. °· Do not wear rings on the injured finger. °· Protect your finger until pain and stiffness go away (usually 3 to 4 weeks). °· Do not get your cast or splint to get wet. Cover your cast or splint with a plastic bag when you shower or bathe. Do not swim. °· Your doctor may suggest special exercises for you to do. These exercises will help keep or stop stiffness from happening. °GET HELP RIGHT AWAY IF: °· Your cast or splint gets damaged. °· Your pain gets worse, not better. °MAKE SURE YOU: °· Understand these instructions. °· Will watch your condition. °· Will get help right away if you are not doing well or get worse. °Document Released: 07/11/2010 Document Revised: 08/31/2011 Document Reviewed: 02/09/2011 °ExitCare® Patient Information ©2015 [employer]. This information is not intended to replace advice given to you by your health care provider. Make sure you discuss any questions you have with your health care provider. ° °

## 2014-01-09 NOTE — ED Provider Notes (Signed)
Medical screening examination/treatment/procedure(s) were performed by non-physician practitioner and as supervising physician I was immediately available for consultation/collaboration.   Dione Boozeavid Emilian Stawicki, MD 01/09/14 [phone]

## 2014-02-14 ENCOUNTER — Encounter (HOSPITAL_COMMUNITY): Payer: Self-pay | Admitting: Emergency Medicine

## 2014-02-14 ENCOUNTER — Emergency Department (HOSPITAL_COMMUNITY)
Admission: EM | Admit: 2014-02-14 | Discharge: 2014-02-14 | Discharge status: Against Medical Advice | Payer: Medicaid Other | Attending: Emergency Medicine | Admitting: Emergency Medicine

## 2014-02-14 ENCOUNTER — Emergency Department (HOSPITAL_COMMUNITY): Payer: Medicaid Other

## 2014-02-14 DIAGNOSIS — R079 Chest pain, unspecified: Secondary | ICD-10-CM | POA: Diagnosis not present

## 2014-02-14 DIAGNOSIS — M549 Dorsalgia, unspecified: Secondary | ICD-10-CM | POA: Diagnosis not present

## 2014-02-14 DIAGNOSIS — E669 Obesity, unspecified: Secondary | ICD-10-CM | POA: Diagnosis not present

## 2014-02-14 DIAGNOSIS — R42 Dizziness and giddiness: Secondary | ICD-10-CM | POA: Diagnosis not present

## 2014-02-14 DIAGNOSIS — R0602 Shortness of breath: Secondary | ICD-10-CM | POA: Diagnosis not present

## 2014-02-14 DIAGNOSIS — R110 Nausea: Secondary | ICD-10-CM | POA: Diagnosis not present

## 2014-02-14 LAB — BASIC METABOLIC PANEL
ANION GAP: 11 (ref 5–15)
BUN: 8 mg/dL (ref 6–23)
CALCIUM: 9.1 mg/dL (ref 8.4–10.5)
CO2: 25 mEq/L (ref 19–32)
Chloride: 103 mEq/L (ref 96–112)
Creatinine, Ser: 0.63 mg/dL (ref 0.50–1.10)
Glucose, Bld: 94 mg/dL (ref 70–99)
POTASSIUM: 3.6 meq/L — AB (ref 3.7–5.3)
SODIUM: 139 meq/L (ref 137–147)

## 2014-02-14 LAB — CBC
HCT: 37.9 % (ref 36.0–46.0)
Hemoglobin: 12.9 g/dL (ref 12.0–15.0)
MCH: 27.3 pg (ref 26.0–34.0)
MCHC: 34 g/dL (ref 30.0–36.0)
MCV: 80.1 fL (ref 78.0–100.0)
PLATELETS: 264 10*3/uL (ref 150–400)
RBC: 4.73 MIL/uL (ref 3.87–5.11)
RDW: 13.1 % (ref 11.5–15.5)
WBC: 8.5 10*3/uL (ref 4.0–10.5)

## 2014-02-14 LAB — I-STAT TROPONIN, ED: TROPONIN I, POC: 0 ng/mL (ref 0.00–0.08)

## 2014-02-14 NOTE — ED Notes (Signed)
Pt L of center chest pain onset 3 days ago, slight radiation to R chest, sharp. Onset while patient was working, +SHOB with exertion, +nausea, +lightheadedness, +back pain "straight through", pain worse with palpation

## 2014-03-20 ENCOUNTER — Telehealth: Payer: Self-pay | Admitting: *Deleted

## 2014-03-20 MED ORDER — METRONIDAZOLE 500 MG PO TABS: 500.0000 mg | ORAL_TABLET | Freq: Two times a day (BID) | ORAL | Status: DC

## 2014-03-20 NOTE — Telephone Encounter (Signed)
500 bid for 7 days

## 2014-03-20 NOTE — Telephone Encounter (Signed)
Med sent and pt aware °

## 2014-03-20 NOTE — Telephone Encounter (Signed)
Pt called needing a refill on Flagyl 500mg , Last refill 08/31/13  [facility]

## 2014-04-02 ENCOUNTER — Emergency Department (HOSPITAL_COMMUNITY)
Admission: EM | Admit: 2014-04-02 | Discharge: 2014-04-02 | Disposition: A | Discharge status: Home / Self Care | Payer: Medicaid Other | Attending: Emergency Medicine | Admitting: Emergency Medicine

## 2014-04-02 ENCOUNTER — Emergency Department (HOSPITAL_COMMUNITY): Payer: Medicaid Other

## 2014-04-02 ENCOUNTER — Encounter (HOSPITAL_COMMUNITY): Payer: Self-pay | Admitting: Emergency Medicine

## 2014-04-02 DIAGNOSIS — Z792 Long term (current) use of antibiotics: Secondary | ICD-10-CM | POA: Diagnosis not present

## 2014-04-02 DIAGNOSIS — Z791 Long term (current) use of non-steroidal anti-inflammatories (NSAID): Secondary | ICD-10-CM | POA: Diagnosis not present

## 2014-04-02 DIAGNOSIS — M791 Myalgia: Secondary | ICD-10-CM | POA: Diagnosis not present

## 2014-04-02 DIAGNOSIS — E669 Obesity, unspecified: Secondary | ICD-10-CM | POA: Diagnosis not present

## 2014-04-02 DIAGNOSIS — M722 Plantar fascial fibromatosis: Secondary | ICD-10-CM | POA: Insufficient documentation

## 2014-04-02 DIAGNOSIS — Z79899 Other long term (current) drug therapy: Secondary | ICD-10-CM | POA: Diagnosis not present

## 2014-04-02 DIAGNOSIS — Z87891 Personal history of nicotine dependence: Secondary | ICD-10-CM | POA: Diagnosis not present

## 2014-04-02 DIAGNOSIS — G43909 Migraine, unspecified, not intractable, without status migrainosus: Secondary | ICD-10-CM | POA: Diagnosis not present

## 2014-04-02 DIAGNOSIS — M2550 Pain in unspecified joint: Secondary | ICD-10-CM | POA: Diagnosis not present

## 2014-04-02 DIAGNOSIS — M79671 Pain in right foot: Secondary | ICD-10-CM | POA: Diagnosis present

## 2014-04-02 MED ORDER — IBUPROFEN 800 MG PO TABS
800.0000 mg | ORAL_TABLET | Freq: Once | ORAL | Status: AC
  Administered 2014-04-02: 800 mg via ORAL
  Filled 2014-04-02: qty 1

## 2014-04-02 MED ORDER — IBUPROFEN 800 MG PO TABS: 800.0000 mg | ORAL_TABLET | Freq: Three times a day (TID) | ORAL | Status: DC

## 2014-04-02 NOTE — ED Notes (Signed)
nad noted prior to dc. Dc instructions reviewed and explained. Voiced understanding. Ambulated out without difficulty. 1 Rx given

## 2014-04-02 NOTE — Discharge Instructions (Signed)
Plantar Fasciitis  Plantar fasciitis is a common condition that causes foot pain. It is soreness (inflammation) of the band of tough fibrous tissue on the bottom of the foot that runs from the heel bone (calcaneus) to the ball of the foot. The cause of this soreness may be from excessive standing, poor fitting shoes, running on hard surfaces, being overweight, having an abnormal walk, or overuse (this is common in runners) of the painful foot or feet. It is also common in aerobic exercise dancers and ballet dancers.  SYMPTOMS   Most people with plantar fasciitis complain of:   Severe pain in the morning on the bottom of their foot especially when taking the first steps out of bed. This pain recedes after a few minutes of walking.   Severe pain is experienced also during walking following a long period of inactivity.   Pain is worse when walking barefoot or up stairs  DIAGNOSIS    Your caregiver will diagnose this condition by examining and feeling your foot.   Special tests such as X-rays of your foot, are usually not needed.  PREVENTION    Consult a sports medicine professional before beginning a new exercise program.   Walking programs offer a good workout. With walking there is a lower chance of overuse injuries common to runners. There is less impact and less jarring of the joints.   Begin all new exercise programs slowly. If problems or pain develop, decrease the amount of time or distance until you are at a comfortable level.   Wear good shoes and replace them regularly.   Stretch your foot and the heel cords at the back of the ankle (Achilles tendon) both before and after exercise.   Run or exercise on even surfaces that are not hard. For example, asphalt is better than pavement.   Do not run barefoot on hard surfaces.   If using a treadmill, vary the incline.   Do not continue to workout if you have foot or joint problems. Seek professional help if they do not improve.  HOME CARE INSTRUCTIONS     Avoid activities that cause you pain until you recover.   Use ice or cold packs on the problem or painful areas after working out.   Only take over-the-counter or prescription medicines for pain, discomfort, or fever as directed by your caregiver.   Soft shoe inserts or athletic shoes with air or gel sole cushions may be helpful.   If problems continue or become more severe, consult a sports medicine caregiver or your own health care provider. Cortisone is a potent anti-inflammatory medication that may be injected into the painful area. You can discuss this treatment with your caregiver.  MAKE SURE YOU:    Understand these instructions.   Will watch your condition.   Will get help right away if you are not doing well or get worse.  Document Released: 03/03/2001 Document Revised: 08/31/2011 Document Reviewed: 05/02/2008  ExitCare Patient Information 2015 [employer]. This information is not intended to replace advice given to you by your health care provider. Make sure you discuss any questions you have with your health care provider.

## 2014-04-02 NOTE — ED Notes (Signed)
Patient complaining of right foot pain x 1 week. Denies injury.

## 2014-04-02 NOTE — ED Provider Notes (Signed)
CSN: [id number]     Arrival date & time 04/02/14  [id number] History   First MD Initiated Contact with Patient 04/02/14 [id number]     Chief Complaint  Patient presents with  . Foot Pain     (Consider location/radiation/quality/duration/timing/severity/associated sxs/prior Treatment) HPI Comments: Patient presents with a two-week history of right foot pain without trauma. She is on her feet a lot at [employer] where she works. Denies any fever or vomiting. Pain is across the sole of her right foot. She's been taking ibuprofen without relief. Denies any weakness, numbness or tingling. No chest pain or shortness of breath. No other joint pain.  The history is provided by the patient.    Past Medical History  Diagnosis Date  . Obesity   . Carpal tunnel syndrome   . Migraine    Past Surgical History  Procedure Laterality Date  . Cholecystectomy    . Cesarean section    . Tubal ligation    . Wisdom tooth extraction    . Cesarean section    . Mouth surgery     Family History  Problem Relation Age of Onset  . Diabetes Mother   . Hypertension Father    History  Substance Use Topics  . Smoking status: Former Smoker -- 0.04 packs/day for 1.5 years    Types: Cigarettes    Quit date: 11/21/2010  . Smokeless tobacco: Never Used  . Alcohol Use: No   OB History   Grav Para Term Preterm Abortions TAB SAB Ect Mult Living   2 2 2       2      Review of Systems  Constitutional: Negative for fever, activity change and appetite change.  Respiratory: Negative for cough, chest tightness and shortness of breath.   Cardiovascular: Negative for chest pain.  Gastrointestinal: Negative for nausea, vomiting and abdominal pain.  Genitourinary: Negative for dysuria, hematuria, vaginal bleeding and vaginal discharge.  Musculoskeletal: Positive for arthralgias and myalgias. Negative for back pain.  Skin: Negative for wound.  Neurological: Negative for dizziness, weakness and headaches.  A complete 10  system review of systems was obtained and all systems are negative except as noted in the HPI and PMH.      Allergies  Coconut flavor and Pineapple  Home Medications   Prior to Admission medications   Medication Sig Start Date End Date Taking? Authorizing Provider  acetaminophen-codeine (TYLENOL #3) 300-30 MG per tablet Take 1-2 tablets by mouth every 6 (six) hours as needed for moderate pain. 01/09/14   Kathie DikeHobson M Bryant, PA-C  ibuprofen (ADVIL,MOTRIN) 800 MG tablet Take 1 tablet (800 mg total) by mouth 3 (three) times daily. 11/21/13   Salley ScarletKawanta F Orrick, MD  ibuprofen (ADVIL,MOTRIN) 800 MG tablet Take 1 tablet (800 mg total) by mouth 3 (three) times daily. 01/09/14   Kathie DikeHobson M Bryant, PA-C  ibuprofen (ADVIL,MOTRIN) 800 MG tablet Take 1 tablet (800 mg total) by mouth 3 (three) times daily. 04/02/14   Glynn OctaveStephen Williams Dietrick, MD  metroNIDAZOLE (FLAGYL) 500 MG tablet Take 1 tablet (500 mg total) by mouth 2 (two) times daily. Take for 7 days 03/20/14   Donita BrooksWarren T Pickard, MD  promethazine (PHENERGAN) 25 MG tablet Take 1 tablet (25 mg total) by mouth every 6 (six) hours as needed for nausea or vomiting. 12/13/13   Burgess AmorJulie Idol, PA-C  valACYclovir (VALTREX) 500 MG tablet Take 1 tablet (500 mg total) by mouth daily. And 1 tab po bid prn outbreak 12/20/13   Donita BrooksWarren T Pickard, MD  BP 115/70  Pulse 83  Temp(Src) 98.3 F (36.8 C) (Oral)  Resp 16  Ht 5\' 7"  (1.702 m)  Wt 230 lb (104.327 kg)  BMI 36.01 kg/m2  SpO2 99%  LMP 03/03/2014 Physical Exam  Nursing note and vitals reviewed. Constitutional: She is oriented to person, place, and time. She appears well-developed and well-nourished. No distress.  HENT:  Head: Normocephalic and atraumatic.  Mouth/Throat: Oropharynx is clear and moist. No oropharyngeal exudate.  Eyes: Conjunctivae and EOM are normal. Pupils are equal, round, and reactive to light.  Neck: Normal range of motion. Neck supple.  No meningismus.  Cardiovascular: Normal rate, regular rhythm, normal  heart sounds and intact distal pulses.   No murmur heard. Pulmonary/Chest: Effort normal and breath sounds normal. No respiratory distress.  Abdominal: Soft. There is no tenderness. There is no rebound and no guarding.  Musculoskeletal: Normal range of motion. She exhibits tenderness. She exhibits no edema.  TTP R plantar fascia.  +2 DP and PT pulses.  Able to wiggle toes.  Neurological: She is alert and oriented to person, place, and time. No cranial nerve deficit. She exhibits normal muscle tone. Coordination normal.  No ataxia on finger to nose bilaterally. No pronator drift. 5/5 strength throughout. CN 2-12 intact. Negative Romberg. Equal grip strength. Sensation intact. Gait is normal.   Skin: Skin is warm.  Psychiatric: She has a normal mood and affect. Her behavior is normal.    ED Course  Procedures (including critical care time) Labs Review Labs Reviewed - No data to display  Imaging Review Dg Foot Complete Right  04/02/2014   CLINICAL DATA:  28 year old female with 1 week of right foot pain radiating from the arch to the dorsum of the foot. No known injury. Initial encounter.  EXAM: RIGHT FOOT COMPLETE - 3+ VIEW  COMPARISON:  None.  FINDINGS: Bone mineralization is within normal limits. Calcaneus intact. Joint spaces and alignment preserved. No fracture or dislocation. Accessory ossicle adjacent to the navicular. No soft tissue abnormality identified.  IMPRESSION: Negative radiographic appearance of the right foot.   Electronically Signed   By: Augusto GambleLee  Hall M.D.   On: 04/02/2014 07:33     EKG Interpretation None      MDM   Final diagnoses:  Plantar fasciitis of right foot   Atraumatic right foot pain for 2 weeks. No fevers or vomiting. No overlying skin changes. Neuro vascular intact.  Exam consistent with plantar fasciitis. X-ray negative.  Anti-inflammatories, hard soled shoe, followup with PCP.  Glynn OctaveStephen Tymier Lindholm, MD 04/02/14 [phone]

## 2014-04-23 ENCOUNTER — Encounter (HOSPITAL_COMMUNITY): Payer: Self-pay | Admitting: Emergency Medicine

## 2014-07-17 ENCOUNTER — Encounter: Payer: Self-pay | Admitting: Family Medicine

## 2014-07-30 ENCOUNTER — Telehealth: Payer: Self-pay | Admitting: Family Medicine

## 2014-07-30 MED ORDER — VALACYCLOVIR HCL 500 MG PO TABS: 500.0000 mg | ORAL_TABLET | Freq: Every day | ORAL | Status: DC

## 2014-07-30 NOTE — Telephone Encounter (Signed)
[facility]  Patient is calling for refill on valtrex  Pharmacy told her to call us  [phone]

## 2014-07-30 NOTE — Telephone Encounter (Signed)
Med sent to pharm 

## 2014-08-29 ENCOUNTER — Encounter: Payer: Self-pay | Admitting: Family Medicine

## 2014-09-09 ENCOUNTER — Emergency Department (HOSPITAL_COMMUNITY)
Admission: EM | Admit: 2014-09-09 | Discharge: 2014-09-09 | Disposition: A | Discharge status: Home / Self Care | Payer: Medicaid Other | Attending: Emergency Medicine | Admitting: Emergency Medicine

## 2014-09-09 ENCOUNTER — Encounter (HOSPITAL_COMMUNITY): Payer: Self-pay

## 2014-09-09 DIAGNOSIS — Z792 Long term (current) use of antibiotics: Secondary | ICD-10-CM | POA: Diagnosis not present

## 2014-09-09 DIAGNOSIS — Z79899 Other long term (current) drug therapy: Secondary | ICD-10-CM | POA: Diagnosis not present

## 2014-09-09 DIAGNOSIS — Z8669 Personal history of other diseases of the nervous system and sense organs: Secondary | ICD-10-CM | POA: Insufficient documentation

## 2014-09-09 DIAGNOSIS — Z8679 Personal history of other diseases of the circulatory system: Secondary | ICD-10-CM | POA: Diagnosis not present

## 2014-09-09 DIAGNOSIS — Z87891 Personal history of nicotine dependence: Secondary | ICD-10-CM | POA: Diagnosis not present

## 2014-09-09 DIAGNOSIS — Z791 Long term (current) use of non-steroidal anti-inflammatories (NSAID): Secondary | ICD-10-CM | POA: Insufficient documentation

## 2014-09-09 DIAGNOSIS — N390 Urinary tract infection, site not specified: Secondary | ICD-10-CM | POA: Insufficient documentation

## 2014-09-09 DIAGNOSIS — E669 Obesity, unspecified: Secondary | ICD-10-CM | POA: Insufficient documentation

## 2014-09-09 LAB — URINALYSIS, ROUTINE W REFLEX MICROSCOPIC
Bilirubin Urine: NEGATIVE
GLUCOSE, UA: NEGATIVE mg/dL
KETONES UR: NEGATIVE mg/dL
NITRITE: NEGATIVE
PH: 6 (ref 5.0–8.0)
PROTEIN: NEGATIVE mg/dL
SPECIFIC GRAVITY, URINE: 1.025 (ref 1.005–1.030)
Urobilinogen, UA: 0.2 mg/dL (ref 0.0–1.0)

## 2014-09-09 LAB — URINE MICROSCOPIC-ADD ON

## 2014-09-09 MED ORDER — CEPHALEXIN 500 MG PO CAPS: 500.0000 mg | ORAL_CAPSULE | Freq: Four times a day (QID) | ORAL | Status: DC

## 2014-09-09 MED ORDER — CEPHALEXIN 500 MG PO CAPS
500.0000 mg | ORAL_CAPSULE | Freq: Once | ORAL | Status: AC
  Administered 2014-09-09: 500 mg via ORAL
  Filled 2014-09-09: qty 1

## 2014-09-09 MED ORDER — PHENAZOPYRIDINE HCL 100 MG PO TABS
200.0000 mg | ORAL_TABLET | Freq: Once | ORAL | Status: AC
  Administered 2014-09-09: 200 mg via ORAL
  Filled 2014-09-09: qty 2

## 2014-09-09 MED ORDER — PHENAZOPYRIDINE HCL 200 MG PO TABS: 200.0000 mg | ORAL_TABLET | Freq: Three times a day (TID) | ORAL | Status: DC

## 2014-09-09 NOTE — ED Notes (Signed)
I think I have a UTI per pt. I have tried to manage it with cranberry juice and ibuprofen, but could no longer deal with it per pt.

## 2014-09-09 NOTE — ED Provider Notes (Signed)
CSN: [id number]     Arrival date & time 09/09/14  2118 History   First MD Initiated Contact with Patient 09/09/14 2145     Chief Complaint  Patient presents with  . Urinary Tract Infection     (Consider location/radiation/quality/duration/timing/severity/associated sxs/prior Treatment) Patient is a 29 y.o. female presenting with urinary tract infection. The history is provided by the patient. No language interpreter was used.  Urinary Tract Infection This is a new problem. The current episode started today. The problem occurs constantly. The problem has been gradually worsening. Associated symptoms include urinary symptoms. Nothing aggravates the symptoms. She has tried nothing for the symptoms. The treatment provided no relief.    Past Medical History  Diagnosis Date  . Obesity   . Carpal tunnel syndrome   . Migraine    Past Surgical History  Procedure Laterality Date  . Cholecystectomy    . Cesarean section    . Tubal ligation    . Wisdom tooth extraction    . Cesarean section    . Mouth surgery     Family History  Problem Relation Age of Onset  . Diabetes Mother   . Hypertension Father    History  Substance Use Topics  . Smoking status: Former Smoker -- 0.04 packs/day for 1.5 years    Types: Cigarettes    Quit date: 11/21/2010  . Smokeless tobacco: Never Used  . Alcohol Use: No   OB History    Gravida Para Term Preterm AB TAB SAB Ectopic Multiple Living   2 2 2       2      Review of Systems  All other systems reviewed and are negative.     Allergies  Coconut flavor and Pineapple  Home Medications   Prior to Admission medications   Medication Sig Start Date End Date Taking? Authorizing Provider  acetaminophen-codeine (TYLENOL #3) 300-30 MG per tablet Take 1-2 tablets by mouth every 6 (six) hours as needed for moderate pain. 01/09/14   Ivery QualeHobson Bryant, PA-C  ibuprofen (ADVIL,MOTRIN) 800 MG tablet Take 1 tablet (800 mg total) by mouth 3 (three) times daily.  11/21/13   Salley ScarletKawanta F Anderson, MD  ibuprofen (ADVIL,MOTRIN) 800 MG tablet Take 1 tablet (800 mg total) by mouth 3 (three) times daily. 01/09/14   Ivery QualeHobson Bryant, PA-C  ibuprofen (ADVIL,MOTRIN) 800 MG tablet Take 1 tablet (800 mg total) by mouth 3 (three) times daily. 04/02/14   Glynn OctaveStephen Rancour, MD  metroNIDAZOLE (FLAGYL) 500 MG tablet Take 1 tablet (500 mg total) by mouth 2 (two) times daily. Take for 7 days 03/20/14   Donita BrooksWarren T Pickard, MD  promethazine (PHENERGAN) 25 MG tablet Take 1 tablet (25 mg total) by mouth every 6 (six) hours as needed for nausea or vomiting. 12/13/13   Burgess AmorJulie Idol, PA-C  valACYclovir (VALTREX) 500 MG tablet Take 1 tablet (500 mg total) by mouth daily. And 1 tab po bid prn outbreak 07/30/14   Donita BrooksWarren T Pickard, MD   BP 115/71 mmHg  Pulse 72  Temp(Src) 98.2 F (36.8 C) (Oral)  Resp 20  Ht 5\' 7"  (1.702 m)  Wt 230 lb (104.327 kg)  BMI 36.01 kg/m2  SpO2 100%  LMP 08/27/2014 (Exact Date) Physical Exam  Constitutional: She is oriented to person, place, and time. She appears well-developed and well-nourished.  HENT:  Head: Normocephalic and atraumatic.  Eyes: EOM are normal. Pupils are equal, round, and reactive to light.  Neck: Normal range of motion.  Cardiovascular: Normal rate and normal heart  sounds.   Pulmonary/Chest: Effort normal.  Abdominal: Soft. She exhibits no distension.  Musculoskeletal: Normal range of motion.  Neurological: She is alert and oriented to person, place, and time.  Skin: Skin is warm.  Psychiatric: She has a normal mood and affect.  Nursing note and vitals reviewed.   ED Course  Procedures (including critical care time) Labs Review Labs Reviewed  URINE CULTURE  URINALYSIS, ROUTINE W REFLEX MICROSCOPIC    Imaging Review No results found.   EKG Interpretation None      MDM   Final diagnoses:  None    Keflex Pyridium See your Physicain for recheck in 1 week    Elson Areas, PA-C 09/09/14 2239  Glynn Octave,  MD 09/09/14 2337

## 2014-09-11 LAB — URINE CULTURE
COLONY COUNT: NO GROWTH
CULTURE: NO GROWTH

## 2014-09-27 ENCOUNTER — Encounter (HOSPITAL_COMMUNITY): Payer: Self-pay

## 2014-09-27 ENCOUNTER — Emergency Department (HOSPITAL_COMMUNITY)
Admission: EM | Admit: 2014-09-27 | Discharge: 2014-09-27 | Discharge status: Against Medical Advice | Payer: Medicaid Other | Attending: Emergency Medicine | Admitting: Emergency Medicine

## 2014-09-27 DIAGNOSIS — R1110 Vomiting, unspecified: Secondary | ICD-10-CM | POA: Diagnosis present

## 2014-09-27 DIAGNOSIS — E669 Obesity, unspecified: Secondary | ICD-10-CM | POA: Insufficient documentation

## 2014-09-27 LAB — CBC WITH DIFFERENTIAL/PLATELET
BASOS PCT: 0 % (ref 0–1)
Basophils Absolute: 0 10*3/uL (ref 0.0–0.1)
EOS PCT: 3 % (ref 0–5)
Eosinophils Absolute: 0.3 10*3/uL (ref 0.0–0.7)
HEMATOCRIT: 36.1 % (ref 36.0–46.0)
Hemoglobin: 12 g/dL (ref 12.0–15.0)
LYMPHS ABS: 2.8 10*3/uL (ref 0.7–4.0)
Lymphocytes Relative: 32 % (ref 12–46)
MCH: 26.8 pg (ref 26.0–34.0)
MCHC: 33.2 g/dL (ref 30.0–36.0)
MCV: 80.8 fL (ref 78.0–100.0)
MONO ABS: 0.4 10*3/uL (ref 0.1–1.0)
MONOS PCT: 5 % (ref 3–12)
NEUTROS PCT: 60 % (ref 43–77)
Neutro Abs: 5.3 10*3/uL (ref 1.7–7.7)
Platelets: 249 10*3/uL (ref 150–400)
RBC: 4.47 MIL/uL (ref 3.87–5.11)
RDW: 13.4 % (ref 11.5–15.5)
WBC: 8.8 10*3/uL (ref 4.0–10.5)

## 2014-09-27 LAB — COMPREHENSIVE METABOLIC PANEL
ALT: 25 U/L (ref 0–35)
AST: 24 U/L (ref 0–37)
Albumin: 3.8 g/dL (ref 3.5–5.2)
Alkaline Phosphatase: 39 U/L (ref 39–117)
Anion gap: 7 (ref 5–15)
BUN: 8 mg/dL (ref 6–23)
CO2: 25 mmol/L (ref 19–32)
Calcium: 8.4 mg/dL (ref 8.4–10.5)
Chloride: 107 mmol/L (ref 96–112)
Creatinine, Ser: 0.57 mg/dL (ref 0.50–1.10)
GFR calc Af Amer: >90 mL/min (ref >90)
GFR calc non Af Amer: >90 mL/min (ref >90)
GLUCOSE: 105 mg/dL — AB (ref 70–99)
POTASSIUM: 3.4 mmol/L — AB (ref 3.5–5.1)
SODIUM: 139 mmol/L (ref 135–145)
Total Bilirubin: 0.7 mg/dL (ref 0.3–1.2)
Total Protein: 6.8 g/dL (ref 6.0–8.3)

## 2014-09-27 LAB — LIPASE, BLOOD: Lipase: 20 U/L (ref 11–59)

## 2014-09-27 NOTE — ED Notes (Signed)
Patient c/o of vomiting since Monday, last episode last night. No PO intake today.

## 2014-10-08 ENCOUNTER — Emergency Department (HOSPITAL_COMMUNITY)
Admission: EM | Admit: 2014-10-08 | Discharge: 2014-10-08 | Disposition: A | Discharge status: Home / Self Care | Payer: Medicaid Other | Attending: Emergency Medicine | Admitting: Emergency Medicine

## 2014-10-08 ENCOUNTER — Emergency Department (HOSPITAL_COMMUNITY): Payer: Medicaid Other

## 2014-10-08 ENCOUNTER — Encounter (HOSPITAL_COMMUNITY): Payer: Self-pay | Admitting: *Deleted

## 2014-10-08 DIAGNOSIS — Z8669 Personal history of other diseases of the nervous system and sense organs: Secondary | ICD-10-CM | POA: Insufficient documentation

## 2014-10-08 DIAGNOSIS — Z791 Long term (current) use of non-steroidal anti-inflammatories (NSAID): Secondary | ICD-10-CM | POA: Diagnosis not present

## 2014-10-08 DIAGNOSIS — E669 Obesity, unspecified: Secondary | ICD-10-CM | POA: Diagnosis not present

## 2014-10-08 DIAGNOSIS — R05 Cough: Secondary | ICD-10-CM | POA: Diagnosis present

## 2014-10-08 DIAGNOSIS — Z792 Long term (current) use of antibiotics: Secondary | ICD-10-CM | POA: Diagnosis not present

## 2014-10-08 DIAGNOSIS — J40 Bronchitis, not specified as acute or chronic: Secondary | ICD-10-CM | POA: Diagnosis not present

## 2014-10-08 DIAGNOSIS — Z87891 Personal history of nicotine dependence: Secondary | ICD-10-CM | POA: Diagnosis not present

## 2014-10-08 DIAGNOSIS — Z8679 Personal history of other diseases of the circulatory system: Secondary | ICD-10-CM | POA: Insufficient documentation

## 2014-10-08 DIAGNOSIS — Z79899 Other long term (current) drug therapy: Secondary | ICD-10-CM | POA: Insufficient documentation

## 2014-10-08 MED ORDER — GUAIFENESIN-CODEINE 100-10 MG/5ML PO SYRP: 5.0000 mL | ORAL_SOLUTION | Freq: Two times a day (BID) | ORAL | Status: DC | PRN

## 2014-10-08 MED ORDER — PREDNISONE 10 MG PO TABS: 20.0000 mg | ORAL_TABLET | Freq: Two times a day (BID) | ORAL | Status: DC

## 2014-10-08 MED ORDER — ALBUTEROL SULFATE HFA 108 (90 BASE) MCG/ACT IN AERS: 1.0000 | INHALATION_SPRAY | Freq: Four times a day (QID) | RESPIRATORY_TRACT | Status: DC | PRN

## 2014-10-08 MED ORDER — LORATADINE 10 MG PO TABS: 10.0000 mg | ORAL_TABLET | Freq: Every day | ORAL | Status: DC

## 2014-10-08 NOTE — ED Notes (Signed)
Cough, since Friday, yellow sputum.  Chills.  Temp 100.2 at home.

## 2014-10-08 NOTE — ED Provider Notes (Signed)
CSN: [id number]     Arrival date & time 10/08/14  1225 History  This chart was scribed for non-physician practitioner Kerrie Buffalo, NP, working with Rolland Porter, MD by Littie Deeds, ED Scribe. This patient was seen in room APFT24/APFT24 and the patient's care was started at 2:38 PM.     Chief Complaint  Patient presents with  . Cough   Patient is a 29 y.o. female presenting with cough. The history is provided by the patient. No language interpreter was used.  Cough Cough characteristics:  Productive Onset quality:  Gradual Duration:  3 days Progression:  Unchanged Chronicity:  New Smoker: no   Context: not sick contacts   Associated symptoms: chills, fever and shortness of breath    HPI Comments: Stacy [name] is a 29 y.o. female who presents to the Emergency Department complaining of gradual onset URI symptoms that started 3 days ago. Patient reports having productive cough, fever of 100.2 F, chills, chest tightness, and SOB. The coughing is worse at night. She denies abdominal pain, nausea and vomiting. She also denies smoking and sick contacts.   Past Medical History  Diagnosis Date  . Obesity   . Carpal tunnel syndrome   . Migraine    Past Surgical History  Procedure Laterality Date  . Cholecystectomy    . Cesarean section    . Tubal ligation    . Wisdom tooth extraction    . Cesarean section    . Mouth surgery     Family History  Problem Relation Age of Onset  . Diabetes Mother   . Hypertension Father    History  Substance Use Topics  . Smoking status: Former Smoker -- 0.04 packs/day for 1.5 years    Types: Cigarettes    Quit date: 11/21/2010  . Smokeless tobacco: Never Used  . Alcohol Use: No   OB History    Gravida Para Term Preterm AB TAB SAB Ectopic Multiple Living   Review of Systems  Constitutional: Positive for fever and chills.  Respiratory: Positive for cough, chest tightness and shortness of breath.   Gastrointestinal: Negative for  nausea, vomiting and abdominal pain.  all other systems negative    Allergies  Coconut flavor and Pineapple  Home Medications   Prior to Admission medications   Medication Sig Start Date End Date Taking? Authorizing Provider  acetaminophen-codeine (TYLENOL #3) 300-30 MG per tablet Take 1-2 tablets by mouth every 6 (six) hours as needed for moderate pain. Patient not taking: Reported on 10/08/2014 01/09/14   Ivery Quale, PA-C  albuterol (PROVENTIL HFA;VENTOLIN HFA) 108 (90 BASE) MCG/ACT inhaler Inhale 1-2 puffs into the lungs every 6 (six) hours as needed for wheezing or shortness of breath. 10/08/14   Hope Orlene Och, NP  cephALEXin (KEFLEX) 500 MG capsule Take 1 capsule (500 mg total) by mouth 4 (four) times daily. Patient not taking: Reported on 10/08/2014 09/09/14   Elson Areas, PA-C  guaiFENesin-codeine [facility]) 100-10 MG/5ML syrup Take 5 mLs by mouth 2 (two) times daily as needed for cough. 10/08/14   Hope Orlene Och, NP  ibuprofen (ADVIL,MOTRIN) 800 MG tablet Take 1 tablet (800 mg total) by mouth 3 (three) times daily. Patient not taking: Reported on 10/08/2014 11/21/13   Salley Scarlet, MD  ibuprofen (ADVIL,MOTRIN) 800 MG tablet Take 1 tablet (800 mg total) by mouth 3 (three) times daily. Patient not taking: Reported on 10/08/2014 01/09/14  Ivery Quale, PA-C  ibuprofen (ADVIL,MOTRIN) 800 MG tablet Take 1 tablet (800 mg total) by mouth 3 (three) times daily. Patient not taking: Reported on 10/08/2014 04/02/14   Glynn Octave, MD  loratadine (CLARITIN) 10 MG tablet Take 1 tablet (10 mg total) by mouth daily. 10/08/14   Hope Orlene Och, NP  metroNIDAZOLE (FLAGYL) 500 MG tablet Take 1 tablet (500 mg total) by mouth 2 (two) times daily. Take for 7 days Patient not taking: Reported on 10/08/2014 03/20/14   Donita Brooks, MD  phenazopyridine (PYRIDIUM) 200 MG tablet Take 1 tablet (200 mg total) by mouth 3 (three) times daily. Patient not taking: Reported on 10/08/2014 09/09/14   Elson Areas, PA-C  predniSONE (DELTASONE) 10 MG tablet Take 2 tablets (20 mg total) by mouth 2 (two) times daily with a meal. 10/08/14   Hope Orlene Och, NP  promethazine (PHENERGAN) 25 MG tablet Take 1 tablet (25 mg total) by mouth every 6 (six) hours as needed for nausea or vomiting. Patient not taking: Reported on 10/08/2014 12/13/13   Burgess Amor, PA-C  valACYclovir (VALTREX) 500 MG tablet Take 1 tablet (500 mg total) by mouth daily. And 1 tab po bid prn outbreak Patient not taking: Reported on 10/08/2014 07/30/14   Donita Brooks, MD   BP 118/79 mmHg  Pulse 96  Temp(Src) 100.2 F (37.9 C) (Oral)  Resp 20  Ht  (1.702 m)  Wt 230 lb (104.327 kg)  BMI 36.01 kg/m2  SpO2 100%  LMP 09/27/2014 Physical Exam  Constitutional: She is oriented to person, place, and time. She appears well-developed and well-nourished. No distress.  HENT:  Head: Normocephalic.  Right Ear: Tympanic membrane normal.  Left Ear: Tympanic membrane normal.  Nose: Rhinorrhea present.  Mouth/Throat: Uvula is midline, oropharynx is clear and moist and mucous membranes are normal.  Eyes: Conjunctivae and EOM are normal.  Neck: Neck supple.  Cardiovascular: Normal rate and regular rhythm.   Pulmonary/Chest: Effort normal. She has wheezes. She has no rales.  Abdominal: Soft. There is no tenderness.  Musculoskeletal: Normal range of motion.  Neurological: She is alert and oriented to person, place, and time. No cranial nerve deficit.  Skin: Skin is warm and dry.  Psychiatric: She has a normal mood and affect. Her behavior is normal.  Nursing note and vitals reviewed.   ED Course  Procedures  DIAGNOSTIC STUDIES: Oxygen Saturation is 100% on room air, normal by my interpretation.    COORDINATION OF CARE: 2:41 PM-Discussed treatment plan which includes medications with pt at bedside and pt agreed to plan.    Labs Review Labs Reviewed - No data to display  Imaging Review Dg Chest 2 View  10/08/2014   CLINICAL DATA:   Cough, chest pain starting 10/05/2014  EXAM: CHEST  2 VIEW  COMPARISON:  02/14/2014  FINDINGS: Cardiomediastinal silhouette is stable. No acute infiltrate or pleural effusion. No pulmonary edema. Bony thorax is unremarkable.  IMPRESSION: No active cardiopulmonary disease.   Electronically Signed   By: Natasha Mead M.D.   On: 10/08/2014 14:10     MDM  29 y.o. female with cough, congestion and wheezing stable for d/c without respiratory distress. Will treat with prednisone, Albuterol inhaler, Claritin and Robitussin AC. Discussed with the patient clinical and x-ray findings and plan of care. All questioned fully answered. She will call return if any problems arise.   Final diagnoses:  Bronchitis   I personally performed the services described in this documentation, which was scribed  in my presence. The recorded information has been reviewed and is accurate.    [address]Hope [place], NP 10/09/14 0825  Rolland PorterMark James, MD 10/17/14 [phone]

## 2014-10-10 ENCOUNTER — Encounter: Payer: Self-pay | Admitting: Family Medicine

## 2014-11-05 ENCOUNTER — Ambulatory Visit (INDEPENDENT_AMBULATORY_CARE_PROVIDER_SITE_OTHER): Payer: Medicaid Other | Admitting: Family Medicine

## 2014-11-05 ENCOUNTER — Encounter: Payer: Self-pay | Admitting: Family Medicine

## 2014-11-05 VITALS — BP 126/74 | HR 78 | Temp 98.4°F | Resp 18 | Ht 67.0 in | Wt 241.0 lb

## 2014-11-05 DIAGNOSIS — R0600 Dyspnea, unspecified: Secondary | ICD-10-CM

## 2014-11-05 DIAGNOSIS — G4710 Hypersomnia, unspecified: Secondary | ICD-10-CM
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
## 2014-11-20 ENCOUNTER — Institutional Professional Consult (permissible substitution): Payer: Medicaid Other | Admitting: Neurology

## 2014-11-20 ENCOUNTER — Telehealth: Payer: Self-pay

## 2014-11-20 NOTE — Telephone Encounter (Signed)
Patient cancelled appt and rescheduled to 6/20.

## 2014-11-29 ENCOUNTER — Telehealth: Payer: Self-pay | Admitting: Family Medicine

## 2014-11-29 MED ORDER — METRONIDAZOLE 500 MG PO TABS: 500.0000 mg | ORAL_TABLET | Freq: Two times a day (BID) | ORAL | Status: DC

## 2014-11-29 NOTE — Telephone Encounter (Signed)
?   OK to Refill -  She takes after periods and last refill was 03/20/14

## 2014-11-29 NOTE — Telephone Encounter (Signed)
Medication called/sent to requested pharmacy  

## 2014-11-29 NOTE — Telephone Encounter (Signed)
ok 

## 2014-11-29 NOTE — Telephone Encounter (Signed)
Patient calling for refill of flagyl [phone]

## 2014-12-10 ENCOUNTER — Ambulatory Visit (INDEPENDENT_AMBULATORY_CARE_PROVIDER_SITE_OTHER): Payer: Medicaid Other | Admitting: Neurology

## 2014-12-10 ENCOUNTER — Encounter: Payer: Self-pay | Admitting: Neurology

## 2014-12-10 VITALS — BP 116/78 | HR 62 | Resp 16 | Ht 67.0 in | Wt 240.0 lb

## 2014-12-10 DIAGNOSIS — R51 Headache: Secondary | ICD-10-CM | POA: Diagnosis not present

## 2014-12-10 DIAGNOSIS — G4733 Obstructive sleep apnea (adult) (pediatric): Secondary | ICD-10-CM

## 2014-12-10 DIAGNOSIS — R519 Headache, unspecified: Secondary | ICD-10-CM

## 2014-12-10 DIAGNOSIS — G4719 Other hypersomnia: Secondary | ICD-10-CM | POA: Diagnosis not present

## 2014-12-10 NOTE — Patient Instructions (Signed)
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
## 2014-12-31 ENCOUNTER — Encounter: Payer: Self-pay | Admitting: Family Medicine

## 2014-12-31 ENCOUNTER — Ambulatory Visit (INDEPENDENT_AMBULATORY_CARE_PROVIDER_SITE_OTHER): Payer: Medicaid Other | Admitting: Family Medicine

## 2014-12-31 VITALS — BP 100/64 | HR 72 | Temp 98.0°F | Resp 18 | Ht 67.0 in | Wt 244.0 lb

## 2014-12-31 DIAGNOSIS — G5792 Unspecified mononeuropathy of left lower limb: Secondary | ICD-10-CM

## 2014-12-31 NOTE — Progress Notes (Signed)
   Subjective:    Patient ID: Stacy [name], female    DOB: 04/18/1986, 29 y.o.   MRN: [id number]  HPI  patient presents with several months of intermittent numbness and tingling in her left foot. She complains of burning paresthesias in the plantar aspect of the left foot. She complains of heat and cold sensations in the foot. All the sensations are distal to the ankle. She denies any injury to the foot. She denies any injury to that ankle. She denies any low back pain. She denies any neuropathic pain radiating out of her lower back. Past Medical History  Diagnosis Date  . Obesity   . Carpal tunnel syndrome   . Migraine    Past Surgical History  Procedure Laterality Date  . Cholecystectomy    . Cesarean section    . Tubal ligation    . Wisdom tooth extraction    . Cesarean section    . Mouth surgery     No current outpatient prescriptions on file prior to visit.   No current facility-administered medications on file prior to visit.   Allergies  Allergen Reactions  . Coconut Flavor Other (See Comments)    Mouth/throat lesions   . Pineapple Other (See Comments)    Mouth/throat leasions   History   Social History  . Marital Status: Single    Spouse Name: N/A  . Number of Children: 2  . Years of Education: 10th    Occupational History  . Crew member at [employer]    Social History Main Topics  . Smoking status: Former Smoker -- 0.04 packs/day for 1.5 years    Types: Cigarettes    Quit date: 11/21/2010  . Smokeless tobacco: Never Used  . Alcohol Use: No  . Drug Use: No  . Sexual Activity: Not Currently    Birth Control/ Protection: Surgical   Other Topics Concern  . Not on file   Social History Narrative   Pt lives with 2 children, ages 859 & 7.   Consumes soda on regular basis, but has stopped in the last 3 weeks       Review of Systems  All other systems reviewed and are negative.      Objective:   Physical Exam  Constitutional: She is oriented to  person, place, and time.  Cardiovascular: Intact distal pulses.   Neurological: She is alert and oriented to person, place, and time. She has normal reflexes. She displays normal reflexes. No cranial nerve deficit. She exhibits normal muscle tone. Coordination normal.  Vitals reviewed.         Assessment & Plan:  Neuropathy of foot, left - Plan: COMPLETE METABOLIC PANEL WITH GFR, TSH, Vitamin B12, Nerve conduction test   Patient has neuropathy in the left foot. I will check her blood sugar, TSH, and a vitamin B12. I will schedule her for a nerve conduction test. Depending upon the results of these we my try treating the patient empirically with gabapentin.

## 2015-01-01 LAB — COMPLETE METABOLIC PANEL WITH GFR
ALK PHOS: 41 U/L (ref 39–117)
ALT: 18 U/L (ref 0–35)
AST: 19 U/L (ref 0–37)
Albumin: 3.8 g/dL (ref 3.5–5.2)
BUN: 10 mg/dL (ref 6–23)
CALCIUM: 8.7 mg/dL (ref 8.4–10.5)
CHLORIDE: 106 meq/L (ref 96–112)
CO2: 26 mEq/L (ref 19–32)
CREATININE: 0.58 mg/dL (ref 0.50–1.10)
GFR, Est African American: >89 mL/min
GFR, Est Non African American: >89 mL/min
Glucose, Bld: 92 mg/dL (ref 70–99)
Potassium: 3.5 mEq/L (ref 3.5–5.3)
Sodium: 141 mEq/L (ref 135–145)
TOTAL PROTEIN: 6.3 g/dL (ref 6.0–8.3)
Total Bilirubin: 0.8 mg/dL (ref 0.2–1.2)

## 2015-01-01 LAB — TSH: TSH: 1.85 u[IU]/mL (ref 0.350–4.500)

## 2015-01-01 LAB — VITAMIN B12: VITAMIN B 12: 410 pg/mL (ref 211–911)

## 2015-01-03 ENCOUNTER — Ambulatory Visit (INDEPENDENT_AMBULATORY_CARE_PROVIDER_SITE_OTHER): Payer: Medicaid Other | Admitting: Neurology

## 2015-01-03 VITALS — BP 165/86 | HR 84

## 2015-01-03 DIAGNOSIS — G4733 Obstructive sleep apnea (adult) (pediatric): Secondary | ICD-10-CM

## 2015-01-03 DIAGNOSIS — G4710 Hypersomnia, unspecified: Secondary | ICD-10-CM

## 2015-01-03 DIAGNOSIS — R0683 Snoring: Secondary | ICD-10-CM

## 2015-01-04 NOTE — Sleep Study (Signed)
Please see the scanned sleep study interpretation located in the Procedure tab within the Chart Review section. 

## 2015-01-11 ENCOUNTER — Telehealth: Payer: Self-pay | Admitting: *Deleted

## 2015-01-11 NOTE — Telephone Encounter (Signed)
pt has appt scheduled for aug 16th at 915am at [facility] pt is aware of appt

## 2015-01-15 ENCOUNTER — Telehealth: Payer: Self-pay | Admitting: Neurology

## 2015-01-15 NOTE — Telephone Encounter (Signed)
Left message to call back for results

## 2015-01-15 NOTE — Telephone Encounter (Signed)
I spoke to patient. She is aware of results. We were able to make appt for this Thursday. I will fax report to PCP.

## 2015-01-15 NOTE — Telephone Encounter (Signed)
Pt seen on 12/10/14, PSG on 01/03/15. Please call and notify the patient that the recent sleep study did not show any significant obstructive sleep apnea. Please inform patient that I would like to go over the details of the study during a follow up appointment and if not already previously scheduled, arrange a followup appointment (please utilize a followu-up slot). Also, route or fax report to PCP and referring MD, if other than PCP.  Once you have spoken to patient, you can close this encounter.   Thanks,  Huston Foley, MD, PhD [facility])

## 2015-01-17 ENCOUNTER — Encounter: Payer: Self-pay | Admitting: Neurology

## 2015-01-17 ENCOUNTER — Ambulatory Visit (INDEPENDENT_AMBULATORY_CARE_PROVIDER_SITE_OTHER): Payer: Medicaid Other | Admitting: Neurology

## 2015-01-17 VITALS — BP 114/76 | HR 68 | Resp 16 | Ht 67.0 in | Wt 242.0 lb

## 2015-01-17 DIAGNOSIS — G4719 Other hypersomnia: Secondary | ICD-10-CM
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
## 2015-01-17 NOTE — Patient Instructions (Signed)
We will do additional blood work today and call you with the results.  I will see you back in about 3 months, we will consider doing additional sleep testing at the time.   Please remember to try to maintain good sleep hygiene, which means: Keep a regular sleep and wake schedule, try not to exercise or have a meal within 2 hours of your bedtime, try to keep your bedroom conducive for sleep, that is, cool and dark, without light distractors such as an illuminated alarm clock, and refrain from watching TV right before sleep or in the middle of the night and do not keep the TV or radio on during the night. Also, try not to use or play on electronic devices at bedtime, such as your cell phone, tablet PC or laptop. If you like to read at bedtime on an electronic device, try to dim the background light as much as possible. Do not eat in the middle of the night.    Do not drive when sleepy.

## 2015-01-18 ENCOUNTER — Telehealth: Payer: Self-pay

## 2015-01-18 LAB — SEDIMENTATION RATE: SED RATE: 3 mm/h (ref 0–32)

## 2015-01-18 LAB — IRON AND TIBC
IRON SATURATION: 21 % (ref 15–55)
Iron: 71 ug/dL (ref 27–159)
Total Iron Binding Capacity: 337 ug/dL (ref 250–450)
UIBC: 266 ug/dL (ref 131–425)

## 2015-01-18 LAB — ANA W/REFLEX: Anti Nuclear Antibody(ANA): NEGATIVE

## 2015-01-18 LAB — FERRITIN: FERRITIN: 28 ng/mL (ref 15–150)

## 2015-01-18 LAB — B. BURGDORFI ANTIBODIES: Lyme IgG/IgM Ab: <0.91 {ISR} (ref 0.00–0.90)

## 2015-01-18 LAB — VITAMIN D 25 HYDROXY (VIT D DEFICIENCY, FRACTURES): Vit D, 25-Hydroxy: 21.3 ng/mL — ABNORMAL LOW (ref 30.0–100.0)

## 2015-01-18 LAB — HGB A1C W/O EAG: Hgb A1c MFr Bld: 5.4 % (ref 4.8–5.6)

## 2015-01-18 NOTE — Progress Notes (Signed)
Quick Note:  Normal labs, but mildly low vit. D at 21.3 (normal range is around 30-100). I would recommend that patient start an OTC Vitamin D supplement: 1000-2000 units daily of any vitamin D supplement of Her choice should be fine. I would recommend recheck of vitamin D status in 3-6 months with PCP.  Please call patient and let her know. thx Huston Foley, MD, PhD [facility] ([facility])    ______

## 2015-01-18 NOTE — Telephone Encounter (Signed)
Left message on personal vm with results and recommendations below. I left call back number for any further questions.

## 2015-01-18 NOTE — Telephone Encounter (Signed)
-----   Message from Huston Foley, MD sent at 01/18/2015  8:21 AM EDT ----- Normal labs, but mildly low vit. D at 21.3 (normal range is around 30-100). I would recommend that patient start an OTC Vitamin D supplement: 1000-2000 units daily of any vitamin D supplement of Her choice should be fine. I would recommend recheck of vitamin D status in 3-6 months with PCP.  Please call patient and let her know. thx Huston Foley, MD, PhD [facility])

## 2015-02-18 ENCOUNTER — Telehealth: Payer: Self-pay | Admitting: *Deleted

## 2015-02-18 NOTE — Telephone Encounter (Signed)
Pt calling needing refills on Valtrex and Flagyl(states she gets it prescribe for 6 mos after her periods).  [facility]  Call back [phone]

## 2015-02-28 ENCOUNTER — Telehealth: Payer: Self-pay | Admitting: *Deleted

## 2015-02-28 NOTE — Telephone Encounter (Signed)
Pt calling needing refills on Valtrex and Flagyl(states she gets it prescribe for 6 mos after her periods).  [facility]  Call back [phone] 

## 2015-03-01 ENCOUNTER — Emergency Department (HOSPITAL_COMMUNITY)
Admission: EM | Admit: 2015-03-01 | Discharge: 2015-03-01 | Disposition: A | Discharge status: Home / Self Care | Payer: Medicaid Other | Attending: Emergency Medicine | Admitting: Emergency Medicine

## 2015-03-01 ENCOUNTER — Emergency Department (HOSPITAL_COMMUNITY): Payer: Medicaid Other

## 2015-03-01 ENCOUNTER — Encounter (HOSPITAL_COMMUNITY): Payer: Self-pay | Admitting: Emergency Medicine

## 2015-03-01 DIAGNOSIS — E669 Obesity, unspecified: Secondary | ICD-10-CM | POA: Diagnosis not present

## 2015-03-01 DIAGNOSIS — Z8669 Personal history of other diseases of the nervous system and sense organs: Secondary | ICD-10-CM | POA: Diagnosis not present

## 2015-03-01 DIAGNOSIS — M25572 Pain in left ankle and joints of left foot: Secondary | ICD-10-CM | POA: Diagnosis not present

## 2015-03-01 DIAGNOSIS — Z8679 Personal history of other diseases of the circulatory system: Secondary | ICD-10-CM | POA: Insufficient documentation

## 2015-03-01 DIAGNOSIS — Z79899 Other long term (current) drug therapy: Secondary | ICD-10-CM | POA: Insufficient documentation

## 2015-03-01 DIAGNOSIS — M79605 Pain in left leg: Secondary | ICD-10-CM | POA: Diagnosis present

## 2015-03-01 DIAGNOSIS — Z87891 Personal history of nicotine dependence: Secondary | ICD-10-CM | POA: Diagnosis not present

## 2015-03-01 MED ORDER — VALACYCLOVIR HCL 500 MG PO TABS: 500.0000 mg | ORAL_TABLET | Freq: Every day | ORAL | Status: DC

## 2015-03-01 MED ORDER — METRONIDAZOLE 500 MG PO TABS: 500.0000 mg | ORAL_TABLET | Freq: Two times a day (BID) | ORAL | Status: DC

## 2015-03-01 MED ORDER — TRAMADOL HCL 50 MG PO TABS: 50.0000 mg | ORAL_TABLET | Freq: Four times a day (QID) | ORAL | Status: DC | PRN

## 2015-03-01 NOTE — ED Provider Notes (Signed)
CSN: [id number]     Arrival date & time 03/01/15  0708 History  This chart was scribed for Bethann Berkshire, MD by Tanda Rockers, ED Scribe. This patient was seen in room APA08/APA08 and the patient's care was started at 8:04 AM.  Chief Complaint  Patient presents with  . Claudication   Patient is a 29 y.o. female presenting with leg pain. The history is provided by the patient. No language interpreter was used.  Leg Pain Location:  Leg Injury: no   Leg location:  L leg Pain details:    Quality:  Sharp and shooting   Radiates to:  Does not radiate   Onset quality:  Sudden   Duration:  2 days   Timing:  Constant Chronicity:  New Dislocation: no   Foreign body present:  No foreign bodies Associated symptoms: swelling   Associated symptoms: no back pain, no fatigue, no muscle weakness, no numbness and no tingling      HPI Comments: MEKAYLAH KLICH is a 29 y.o. female who presents to the Emergency Department complaining of sudden onset, sharp, shooting, diffuse LLE pain x 2 days ago. The pain is mostly localized to foot and posterior calf. Pt states that the leg is painful to the touch. She notes increased swelling to the lower leg and foot as well. Pt works at [employer] and mentions being on her feet a lot for work. She denies any recent injury, trauma, or fall. Pt denies chest pain, shortness of breath, weakness, numbness, tingling, or any other associated symptoms.    Past Medical History  Diagnosis Date  . Obesity   . Carpal tunnel syndrome   . Migraine    Past Surgical History  Procedure Laterality Date  . Cholecystectomy    . Cesarean section    . Tubal ligation    . Wisdom tooth extraction    . Cesarean section    . Mouth surgery     Family History  Problem Relation Age of Onset  . Diabetes Mother   . Hypertension Mother   . Hypertension Father    Social History  Substance Use Topics  . Smoking status: Former Smoker -- 0.04 packs/day for 1.5 years   Types: Cigarettes    Quit date: 11/21/2010  . Smokeless tobacco: Never Used  . Alcohol Use: No   OB History    Gravida Para Term Preterm AB TAB SAB Ectopic Multiple Living   2 2 2       2      Review of Systems  Constitutional: Negative for appetite change and fatigue.  HENT: Negative for congestion, ear discharge and sinus pressure.   Eyes: Negative for discharge.  Respiratory: Negative for cough and shortness of breath.   Cardiovascular: Negative for chest pain.  Gastrointestinal: Negative for abdominal pain and diarrhea.  Genitourinary: Negative for frequency and hematuria.  Musculoskeletal: Positive for joint swelling and arthralgias (LLE pain). Negative for back pain.  Skin: Negative for color change, rash and wound.  Neurological: Negative for seizures, weakness, numbness and headaches.  Psychiatric/Behavioral: Negative for hallucinations.   Allergies  Coconut flavor and Pineapple  Home Medications   Prior to Admission medications   Medication Sig Start Date End Date Taking? Authorizing Provider  valACYclovir (VALTREX) 500 MG tablet Take 500 mg by mouth daily.    Historical Provider, MD   Triage Vitals: BP 116/74 mmHg  Pulse 66  Temp(Src) 97.7 F (36.5 C) (Oral)  Resp 15  Ht 5\' 7"  (  1.702 m)  Wt 235 lb (106.595 kg)  BMI 36.80 kg/m2  SpO2 97%  LMP 03/01/2015   Physical Exam  Constitutional: She is oriented to person, place, and time. She appears well-developed.  HENT:  Head: Normocephalic.  Eyes: Conjunctivae are normal.  Neck: No tracheal deviation present.  Cardiovascular:  No murmur heard. Musculoskeletal: Normal range of motion.  Tenderness in the ankle, top of foot, posterior calf Minimal swelling to the ankle NV exam normal  Neurological: She is oriented to person, place, and time.  Skin: Skin is warm.  Psychiatric: She has a normal mood and affect.    ED Course  Procedures (including critical care time)  DIAGNOSTIC STUDIES: Oxygen Saturation  is 97% on RA, normal by my interpretation.    COORDINATION OF CARE: 8:06 AM-Discussed treatment plan which includes [place] Lower extremity, DG L Foot, DG L Ankle with pt at bedside and pt agreed to plan.   Labs Review Labs Reviewed - No data to display  Imaging Review Dg Ankle Complete Left  03/01/2015   CLINICAL DATA:  Leg pain.  No known injury.  Initial evaluation.  EXAM: LEFT ANKLE COMPLETE - 3+ VIEW  COMPARISON:  None.  FINDINGS: No acute bony or joint abnormality identified. Patch that tiny bony density noted anterior to the distal tibia. Tiny fracture chip could present this fashion. This may be old. No other focal abnormality identified.  IMPRESSION: Tiny approximately 2 mm bony densities noted along the anterior aspect of the distal tibia, possibly an old tiny fracture chip. Exam is otherwise unremarkable.   Electronically Signed   By: Maisie Fus  Register   On: 03/01/2015 08:38   US Arterial Seg Single  03/01/2015   CLINICAL DATA:  Left lower extremity pain x2 days.  No trauma.  EXAM: NONINVASIVE PHYSIOLOGIC VASCULAR STUDY OF BILATERAL LOWER EXTREMITIES  TECHNIQUE: Evaluation of both lower extremities were performed at rest, including calculation of ankle-brachial indices with single level Doppler, pressure recording.  COMPARISON:  None.  FINDINGS: Right ABI:  1.11  Left ABI:  1.15  Right Lower Extremity:  Triphasic waveforms distally  Left Lower Extremity:  Triphasic waveforms distally  IMPRESSION: 1. No evidence of hemodynamically significant lower extremity arterial occlusive disease at rest.   Electronically Signed   By: Corlis Leak M.D.   On: 03/01/2015 08:52   Dg Foot Complete Left  03/01/2015   CLINICAL DATA:  Pain for 3 days  EXAM: LEFT FOOT - COMPLETE 3+ VIEW  COMPARISON:  None.  FINDINGS: Frontal, oblique, and lateral views obtained. There is no fracture or dislocation. Joint spaces appear intact. No erosive change.  IMPRESSION: No fracture or dislocation.  No appreciable arthropathy.    Electronically Signed   By: Bretta Bang III M.D.   On: 03/01/2015 08:39   I have personally reviewed and evaluated these images and lab results as part of my medical decision-making.   EKG Interpretation None      MDM   Final diagnoses:  Leg pain, left   x-ray and ultrasound of left lower extremity were negative. Patient has inflammation in left ankle possibly secondary to prolonged standing. Will treat with ASO crutches Ultram and rest of ankle. Patient's follow-up with PCP  The chart was scribed for me under my direct supervision.  I personally performed the history, physical, and medical decision making and all procedures in the evaluation of this patient.Bethann Berkshire, MD 03/01/15 1228

## 2015-03-01 NOTE — ED Notes (Signed)
Patient c/o left leg pain since Wednesday with no known injury. Per patient swelling in calf and foot. Patient reports using ibuprofen and elevation with no relief. Per patient most of pain is in posterior and lateral aspect of leg with movement. Patient also states that left leg is warmer then right. Denies any fevers.

## 2015-03-01 NOTE — ED Notes (Signed)
Pt reports sharp pain in posterior and lateral aspects of LLE that started yesterday. Pt is tender to palpation of posterior and lateral aspects of LLE and left foot. Left foot seems slightly swollen. Pt reports left foot is warmer than right foot. No difference in temperature is felt. BLE are warm, dry and intact. Pt denies fever or injury. Pt has pain with any type of movement of LLE.

## 2015-03-01 NOTE — Telephone Encounter (Signed)
Medication called/sent to requested pharmacy  

## 2015-03-01 NOTE — ED Notes (Signed)
MD at bedside. 

## 2015-03-01 NOTE — Discharge Instructions (Signed)
Follow up with your md next week.  Use crutches until recheck

## 2015-03-07 ENCOUNTER — Encounter: Payer: Self-pay | Admitting: Neurology

## 2015-04-19 ENCOUNTER — Ambulatory Visit: Payer: Medicaid Other | Admitting: Neurology

## 2015-05-06 ENCOUNTER — Encounter: Payer: Self-pay | Admitting: Family Medicine

## 2015-05-06 ENCOUNTER — Ambulatory Visit (INDEPENDENT_AMBULATORY_CARE_PROVIDER_SITE_OTHER): Payer: Medicaid Other | Admitting: Family Medicine

## 2015-05-06 VITALS — BP 130/74 | HR 78 | Temp 97.9°F | Resp 14 | Ht 67.0 in | Wt 241.0 lb

## 2015-05-06 DIAGNOSIS — J069 Acute upper respiratory infection, unspecified: Secondary | ICD-10-CM

## 2015-05-06 DIAGNOSIS — J0100 Acute maxillary sinusitis, unspecified: Secondary | ICD-10-CM

## 2015-05-06 MED ORDER — AMOXICILLIN 875 MG PO TABS: 875.0000 mg | ORAL_TABLET | Freq: Two times a day (BID) | ORAL | Status: DC

## 2015-05-06 MED ORDER — FLUCONAZOLE 150 MG PO TABS: 150.0000 mg | ORAL_TABLET | Freq: Once | ORAL | Status: DC

## 2015-05-06 NOTE — Patient Instructions (Signed)
Take antibiotics Use mucinex D or Sudafed Nasal saline rinse WORK NOTE FOR TODAY  F/U as needed

## 2015-05-06 NOTE — Progress Notes (Signed)
Patient ID: Morrison OldGina L Cicero, female   DOB: 07/13/1985, 29 y.o.   MRN: [id number]   Subjective:    Patient ID: Morrison OldGina L Mangold, female    DOB: 10/14/1985, 29 y.o.   MRN: [id number]  Patient presents for Illness  patient here with severe sinus pressure and drainage worse on the left side of her face she states she even has pain behind her eyes. She's had fever yesterday 101F. She's also had sore throat here pain feels like they need to pop. Now she has cough is mild production. No known sick contacts. She has taken some over-the-counter meds with minimal improvement.    Review Of Systems:  GEN- denies fatigue,+ fever, weight loss,weakness, recent illness HEENT- denies eye drainage, change in vision, +nasal discharge, CVS- denies chest pain, palpitations RESP- denies SOB, +cough, wheeze ABD- denies N/V, change in stools, abd pain Neuro- + headache, dizziness, syncope, seizure activity       Objective:    BP 130/74 mmHg  Pulse 78  Temp(Src) 97.9 F (36.6 C) (Oral)  Resp 14  Ht 5\' 7"  (1.702 m)  Wt 241 lb (109.317 kg)  BMI 37.74 kg/m2  LMP 04/29/2015 (Approximate) GEN- NAD, alert and oriented x3 HEENT- PERRL, EOMI, non injected sclera, pink conjunctiva, MMM, oropharynx mild injection, TM clear bilat no effusion,  + maxillary sinus tenderness, inflammed turbinates,  Nasal drainage  Neck- Supple, no LAD CVS- RRR, no murmur RESP-CTAB EXT- No edema Pulses- Radial 2+          Assessment & Plan:      Problem List Items Addressed This Visit    None    Visit Diagnoses    Acute maxillary sinusitis, recurrence not specified    -  Primary    Treat with amox, mucinex DM, nasal saline, URI from nasal drip, causing some chest congestion/cough    Relevant Medications    amoxicillin (AMOXIL) 875 MG tablet    fluconazole (DIFLUCAN) 150 MG tablet    Acute URI        Relevant Medications    fluconazole (DIFLUCAN) 150 MG tablet       Note: This dictation was prepared with Dragon  dictation along with smaller phrase technology. Any transcriptional errors that result from this process are unintentional.

## 2015-08-07 ENCOUNTER — Encounter: Payer: Self-pay | Admitting: *Deleted

## 2015-08-07 ENCOUNTER — Ambulatory Visit (INDEPENDENT_AMBULATORY_CARE_PROVIDER_SITE_OTHER): Payer: Medicaid Other | Admitting: Physician Assistant

## 2015-08-07 ENCOUNTER — Other Ambulatory Visit: Payer: Self-pay | Admitting: Physician Assistant

## 2015-08-07 ENCOUNTER — Encounter: Payer: Self-pay | Admitting: Physician Assistant

## 2015-08-07 VITALS — BP 114/66 | HR 84 | Temp 98.1°F | Resp 18 | Wt 240.0 lb

## 2015-08-07 DIAGNOSIS — J101 Influenza due to other identified influenza virus with other respiratory manifestations: Secondary | ICD-10-CM | POA: Diagnosis not present

## 2015-08-07 DIAGNOSIS — Z20828 Contact with and (suspected) exposure to other viral communicable diseases: Secondary | ICD-10-CM

## 2015-08-07 DIAGNOSIS — B009 Herpesviral infection, unspecified: Secondary | ICD-10-CM | POA: Insufficient documentation

## 2015-08-07 DIAGNOSIS — J111 Influenza due to unidentified influenza virus with other respiratory manifestations: Secondary | ICD-10-CM | POA: Diagnosis not present

## 2015-08-07 LAB — INFLUENZA A AND B AG, IMMUNOASSAY
INFLUENZA B ANTIGEN: NOT DETECTED
Influenza A Antigen: DETECTED — AB

## 2015-08-07 MED ORDER — OSELTAMIVIR PHOSPHATE 75 MG PO CAPS: 75.0000 mg | ORAL_CAPSULE | Freq: Two times a day (BID) | ORAL | Status: DC

## 2015-08-07 MED ORDER — VALACYCLOVIR HCL 500 MG PO TABS: 500.0000 mg | ORAL_TABLET | Freq: Every day | ORAL | Status: DC

## 2015-08-07 NOTE — Progress Notes (Signed)
Patient ID: Stacy [name] MRN: [id number], DOB: 1985/08/13, 30 y.o. Date of Encounter: 08/07/2015, 1:02 PM    Chief Complaint:  Chief Complaint  Patient presents with  . daughter + flu    pt has fever,chills  . Medication Refill    Valtrex     HPI: 30 y.o. year old white female since with above.  States that yesterday her youngest daughter tested positive for influenza. Pt states that yesterday she and the child laid down for a nap at 11 AM. She woke at 1 PM with chills and fever 102. Pt states that she has been taking Tylenol and Motrin on a regular basis since then. She states that she's having a little bit of discomfort in her sinus area and, down her neck and some cough.  Also needs a refill on her Valtrex. She takes this daily suppressive therapy for genital herpes.  No other complaints or concerns.     Home Meds:   Outpatient Prescriptions Prior to Visit  Medication Sig Dispense Refill  . ibuprofen (ADVIL,MOTRIN) 200 MG tablet Take 800 mg by mouth every 6 (six) hours as needed.    . valACYclovir (VALTREX) 500 MG tablet Take 1 tablet (500 mg total) by mouth daily. 30 tablet 5  . fluconazole (DIFLUCAN) 150 MG tablet Take 1 tablet (150 mg total) by mouth once. (Patient not taking: Reported on 08/07/2015) 1 tablet 1  . amoxicillin (AMOXIL) 875 MG tablet Take 1 tablet (875 mg total) by mouth 2 (two) times daily. 20 tablet 0   No facility-administered medications prior to visit.    Allergies:  Allergies  Allergen Reactions  . Coconut Flavor Other (See Comments)    Mouth/throat lesions   . Pineapple Other (See Comments)    Mouth/throat leasions      Review of Systems: See HPI for pertinent ROS. All other ROS negative.    Physical Exam: Blood pressure 114/66, pulse 84, temperature 98.1 F (36.7 C), temperature source Oral, resp. rate 18, weight 240 lb (108.863 kg)., Body mass index is 37.58 kg/(m^2). General:  Obese white female. Appears in no acute  distress. HEENT: Normocephalic, atraumatic, eyes without discharge, sclera non-icteric, nares are without discharge. Bilateral auditory canals clear, TM's are without perforation, pearly grey and translucent with reflective cone of light bilaterally. Oral cavity moist, posterior pharynx without exudate, erythema, peritonsillar abscess. No tenderness with percussion to frontal or maxillary sinuses bilaterally.  Neck: Supple. No thyromegaly. No lymphadenopathy. Lungs: Clear bilaterally to auscultation without wheezes, rales, or rhonchi. Breathing is unlabored. Heart: Regular rhythm. No murmurs, rubs, or gallops. Msk:  Strength and tone normal for age. Extremities/Skin: Warm and dry.  Neuro: Alert and oriented X 3. Moves all extremities spontaneously. Gait is normal. CNII-XII grossly in tact. Psych:  Responds to questions appropriately with a normal affect.     ASSESSMENT AND PLAN:  30 y.o. year old female with  1. Influenza A She is to start Tamiflu immediately, take as directed, and complete all of it. Continue Tylenol and Motrin for fever control. In use over-the-counter cough medication as needed for symptom relief. Follow-up if symptoms are since significantly or do not resolve. - oseltamivir (TAMIFLU) 75 MG capsule; Take 1 capsule (75 mg total) by mouth 2 (two) times daily.  Dispense: 10 capsule; Refill: 0  Exposure to influenza - Influenza a and b  HSV-2 (herpes simplex virus 2) infection - valACYclovir (VALTREX) 500 MG tablet; Take 1 tablet (500 mg total) by mouth daily.  Dispense:  30 tablet; Refill: [address] [place], [place], [facility] 08/07/2015 1:02 PM

## 2015-08-15 ENCOUNTER — Encounter: Payer: Self-pay | Admitting: Family Medicine

## 2015-08-15 ENCOUNTER — Ambulatory Visit (INDEPENDENT_AMBULATORY_CARE_PROVIDER_SITE_OTHER): Payer: Medicaid Other | Admitting: Family Medicine

## 2015-08-15 VITALS — BP 120/70 | HR 72 | Temp 98.2°F | Resp 18 | Wt 238.0 lb

## 2015-08-15 DIAGNOSIS — N760 Acute vaginitis: Secondary | ICD-10-CM

## 2015-08-15 DIAGNOSIS — B009 Herpesviral infection, unspecified: Secondary | ICD-10-CM | POA: Diagnosis not present

## 2015-08-15 LAB — WET PREP FOR TRICH, YEAST, CLUE: TRICH WET PREP: NONE SEEN

## 2015-08-15 MED ORDER — METRONIDAZOLE 500 MG PO TABS: 500.0000 mg | ORAL_TABLET | Freq: Three times a day (TID) | ORAL | Status: DC

## 2015-08-15 MED ORDER — FLUCONAZOLE 150 MG PO TABS: 150.0000 mg | ORAL_TABLET | Freq: Once | ORAL | Status: DC

## 2015-08-15 MED ORDER — ACYCLOVIR 400 MG PO TABS: 400.0000 mg | ORAL_TABLET | Freq: Three times a day (TID) | ORAL | Status: DC

## 2015-08-15 NOTE — Addendum Note (Signed)
Addended by: Legrand Rams B on: 08/15/2015 04:45 PM   Modules accepted: Orders

## 2015-08-15 NOTE — Progress Notes (Signed)
Subjective:    Patient ID: Stacy [name], female    DOB: [birth year], 30 y.o.   MRN: [id number]  HPI   patient is on Valtrex 500 mg by mouth daily for prevention of recurrent outbreaks of  Genital HSV.   This usually occurs 1-2 times a yer. Recently she has had an outbreak this seems to have lasted several weeks. She reports burning and discomfort and ulcers and blisters around her vulva.   She also complains of vaginal discharge. She states that after every menstrual cycle she will develop itching and burning and yellow to white vaginal discharge. It only occurs after her menstrual cycle.  Past Medical History  Diagnosis Date  . Obesity   . Carpal tunnel syndrome   . Migraine    Past Surgical History  Procedure Laterality Date  . Cholecystectomy    . Cesarean section    . Tubal ligation    . Wisdom tooth extraction    . Cesarean section    . Mouth surgery     Current Outpatient Prescriptions on File Prior to Visit  Medication Sig Dispense Refill  . ibuprofen (ADVIL,MOTRIN) 200 MG tablet Take 800 mg by mouth every 6 (six) hours as needed.     No current facility-administered medications on file prior to visit.   Allergies  Allergen Reactions  . Coconut Flavor Other (See Comments)    Mouth/throat lesions   . Pineapple Other (See Comments)    Mouth/throat leasions   Social History   Social History  . Marital Status: Single    Spouse Name: N/A  . Number of Children: 2  . Years of Education: 10th    Occupational History  . Crew member at [employer]    Social History Main Topics  . Smoking status: Former Smoker -- 0.04 packs/day for 1.5 years    Types: Cigarettes    Quit date: 11/21/2010  . Smokeless tobacco: Never Used  . Alcohol Use: No  . Drug Use: No  . Sexual Activity: Not Currently    Birth Control/ Protection: Surgical   Other Topics Concern  . Not on file   Social History Narrative   Pt lives with 2 children, ages 16 & 7.   Consumes soda on regular  basis, but has stopped in the last 3 weeks      Review of Systems  All other systems reviewed and are negative.      Objective:   Physical Exam  Cardiovascular: Normal rate, regular rhythm and normal heart sounds.   Pulmonary/Chest: Effort normal and breath sounds normal.  Vitals reviewed.  patient declines a pelvic exam. She states that this is her typical pain for herpes and would feel more comfortable just treating it as such. She states that if it does not improve after I treated his herpes that she would then allow me to look and see exactly what the rash looks like. She will also perform a self wet prep        Assessment & Plan:  HSV (herpes simplex virus) infection - Plan: acyclovir (ZOVIRAX) 400 MG tablet  Vaginitis and vulvovaginitis - Plan: WET PREP FOR TRICH, YEAST, CLUE   continue Valtrex 500 mg by mouth daily for prevention. However use acyclovir 400 mg by mouth 3 times a day for 10 days for treatment of this recent outbreak.  If lesions persist, the patient must return to that we can actually see what the lesions look like and perhaps biopsy. Sounds like the patient  may be developing either Candida vaginitis or bacterial vaginosis after her menstrual cycles. We will use wet prep to determine what the infection to treat the active infection is and then I would recommend hylafem 1 supp intravaginally for six days around the time of each menstrual cycle.   Wet prep shows a combination of yeast as well as bacterial vaginosis. I will treat the use with Diflucan 150 mg by mouth 1 and I'll treat the bacterial vaginosis with Flagyl 500 mg by mouth twice a day for 7 days. She will then use hylafem intravaginally for 3-6 days intravaginally around the end of each menstrual cycle to try to prevent this in the future

## 2015-10-12 ENCOUNTER — Emergency Department (HOSPITAL_COMMUNITY)
Admission: EM | Admit: 2015-10-12 | Discharge: 2015-10-12 | Disposition: A | Discharge status: Home / Self Care | Payer: Medicaid Other | Attending: Emergency Medicine | Admitting: Emergency Medicine

## 2015-10-12 ENCOUNTER — Encounter (HOSPITAL_COMMUNITY): Payer: Self-pay | Admitting: Emergency Medicine

## 2015-10-12 DIAGNOSIS — E669 Obesity, unspecified: Secondary | ICD-10-CM | POA: Insufficient documentation

## 2015-10-12 DIAGNOSIS — Z87891 Personal history of nicotine dependence: Secondary | ICD-10-CM | POA: Diagnosis not present

## 2015-10-12 DIAGNOSIS — J329 Chronic sinusitis, unspecified: Secondary | ICD-10-CM

## 2015-10-12 DIAGNOSIS — J069 Acute upper respiratory infection, unspecified: Secondary | ICD-10-CM | POA: Insufficient documentation

## 2015-10-12 MED ORDER — LORATADINE-PSEUDOEPHEDRINE ER 5-120 MG PO TB12: 1.0000 | ORAL_TABLET | Freq: Two times a day (BID) | ORAL | Status: DC

## 2015-10-12 MED ORDER — PROMETHAZINE-DM 6.25-15 MG/5ML PO SYRP: 5.0000 mL | ORAL_SOLUTION | Freq: Four times a day (QID) | ORAL | Status: DC | PRN

## 2015-10-12 MED ORDER — AMOXICILLIN 500 MG PO CAPS: 500.0000 mg | ORAL_CAPSULE | Freq: Three times a day (TID) | ORAL | Status: DC

## 2015-10-12 NOTE — ED Notes (Signed)
Pt states she is very congested.  Started last night before going to bed.

## 2015-10-12 NOTE — Discharge Instructions (Signed)
Your examination is consistent with an upper respiratory infection with a sinus infection. Please use Amoxil 3 times daily, and Claritin-D 2 times daily for congestion. Use promethazine cough medication for cough. This medication may cause drowsiness, please use with caution. Please see Dr. Tanya NonesPickard for additional evaluation if not improving. Upper Respiratory Infection, Adult Most upper respiratory infections (URIs) are caused by a virus. A URI affects the nose, throat, and upper air passages. The most common type of URI is often called "the common cold." HOME CARE   Take medicines only as told by your doctor.  Gargle warm saltwater or take cough drops to comfort your throat as told by your doctor.  Use a warm mist humidifier or inhale steam from a shower to increase air moisture. This may make it easier to breathe.  Drink enough fluid to keep your pee (urine) clear or pale yellow.  Eat soups and other clear broths.  Have a healthy diet.  Rest as needed.  Go back to work when your fever is gone or your doctor says it is okay.  You may need to stay home longer to avoid giving your URI to others.  You can also wear a face mask and wash your hands often to prevent spread of the virus.  Use your inhaler more if you have asthma.  Do not use any tobacco products, including cigarettes, chewing tobacco, or electronic cigarettes. If you need help quitting, ask your doctor. GET HELP IF:  You are getting worse, not better.  Your symptoms are not helped by medicine.  You have chills.  You are getting more short of breath.  You have brown or red mucus.  You have yellow or brown discharge from your nose.  You have pain in your face, especially when you bend forward.  You have a fever.  You have puffy (swollen) neck glands.  You have pain while swallowing.  You have white areas in the back of your throat. GET HELP RIGHT AWAY IF:   You have very bad or  constant:  Headache.  Ear pain.  Pain in your forehead, behind your eyes, and over your cheekbones (sinus pain).  Chest pain.  You have long-lasting (chronic) lung disease and any of the following:  Wheezing.  Long-lasting cough.  Coughing up blood.  A change in your usual mucus.  You have a stiff neck.  You have changes in your:  Vision.  Hearing.  Thinking.  Mood. MAKE SURE YOU:   Understand these instructions.  Will watch your condition.  Will get help right away if you are not doing well or get worse.   This information is not intended to replace advice given to you by your health care provider. Make sure you discuss any questions you have with your health care provider.   Document Released: 11/25/2007 Document Revised: 10/23/2014 Document Reviewed: 09/13/2013 Elsevier Interactive Patient Education [employer]2016 [employer] Inc.

## 2015-10-12 NOTE — ED Provider Notes (Signed)
CSN: [id number]     Arrival date & time 10/12/15  1844 History   First MD Initiated Contact with Patient 10/12/15 1912     Chief Complaint  Patient presents with  . Nasal Congestion     (Consider location/radiation/quality/duration/timing/severity/associated sxs/prior Treatment) Patient is a 30 y.o. female presenting with URI. The history is provided by the patient.  URI Presenting symptoms: congestion, cough and fatigue   Severity:  Moderate Onset quality:  Gradual Duration:  2 days Timing:  Intermittent Progression:  Worsening Chronicity:  New Relieved by:  Nothing Worsened by:  Nothing tried Associated symptoms: myalgias and sinus pain   Risk factors: sick contacts   Risk factors: no chronic cardiac disease and no chronic kidney disease     Past Medical History  Diagnosis Date  . Obesity   . Carpal tunnel syndrome   . Migraine    Past Surgical History  Procedure Laterality Date  . Cholecystectomy    . Cesarean section    . Tubal ligation    . Wisdom tooth extraction    . Cesarean section    . Mouth surgery     Family History  Problem Relation Age of Onset  . Diabetes Mother   . Hypertension Mother   . Hypertension Father    Social History  Substance Use Topics  . Smoking status: Former Smoker -- 0.04 packs/day for 1.5 years    Types: Cigarettes    Quit date: 11/21/2010  . Smokeless tobacco: Never Used  . Alcohol Use: No   OB History    Gravida Para Term Preterm AB TAB SAB Ectopic Multiple Living   2 2 2       2      Review of Systems  Constitutional: Positive for fatigue.  HENT: Positive for congestion.   Respiratory: Positive for cough.   Musculoskeletal: Positive for myalgias.  All other systems reviewed and are negative.     Allergies  Coconut flavor and Pineapple  Home Medications   Prior to Admission medications   Medication Sig Start Date End Date Taking? Authorizing Provider  acyclovir (ZOVIRAX) 400 MG tablet Take 1 tablet (400 mg  total) by mouth 3 (three) times daily. 08/15/15   Donita BrooksWarren T Pickard, MD  fluconazole (DIFLUCAN) 150 MG tablet Take 1 tablet (150 mg total) by mouth once. 08/15/15   Donita BrooksWarren T Pickard, MD  ibuprofen (ADVIL,MOTRIN) 200 MG tablet Take 800 mg by mouth every 6 (six) hours as needed.    Historical Provider, MD  metroNIDAZOLE (FLAGYL) 500 MG tablet Take 1 tablet (500 mg total) by mouth 3 (three) times daily. 08/15/15   Donita BrooksWarren T Pickard, MD  valACYclovir (VALTREX) 500 MG tablet Take 500 mg by mouth 2 (two) times daily.    Historical Provider, MD   BP 130/74 mmHg  Pulse 79  Temp(Src) 98.1 F (36.7 C) (Oral)  Resp 20  Ht 5\' 7"  (1.702 m)  Wt 104.327 kg  BMI 36.01 kg/m2  SpO2 98%  LMP 09/21/2015 Physical Exam  Constitutional: She is oriented to person, place, and time. She appears well-developed and well-nourished.  Non-toxic appearance.  HENT:  Head: Normocephalic.  Right Ear: Tympanic membrane and external ear normal.  Left Ear: Tympanic membrane and external ear normal.  Nasal congestion present.  Airway is patent.  Eyes: EOM and lids are normal. Pupils are equal, round, and reactive to light.  Neck: Normal range of motion. Neck supple. Carotid bruit is not present.  Cardiovascular: Normal rate, regular rhythm, normal heart  sounds, intact distal pulses and normal pulses.   Pulmonary/Chest: Breath sounds normal. No respiratory distress. She has no wheezes. She has no rales.  Upper anterior chest soreness.  Abdominal: Soft. Bowel sounds are normal. There is no tenderness. There is no guarding.  Musculoskeletal: Normal range of motion. She exhibits no edema.  Lymphadenopathy:       Head (right side): No submandibular adenopathy present.       Head (left side): No submandibular adenopathy present.    She has no cervical adenopathy.  Neurological: She is alert and oriented to person, place, and time. She has normal strength. No cranial nerve deficit or sensory deficit.  Skin: Skin is warm and dry.   Psychiatric: She has a normal mood and affect. Her speech is normal.  Nursing note and vitals reviewed.   ED Course  Procedures (including critical care time) Labs Review Labs Reviewed - No data to display  Imaging Review No results found. I have personally reviewed and evaluated these images and lab results as part of my medical decision-making.   EKG Interpretation None      MDM  Vital signs within normal limits. The pulse oximetry is 98% on room air. The patient has symptoms consistent with an upper respiratory infection. We will use Claritin-D for congestion. Will use promethazine DM for cough. Patient use Tylenol and/or ibuprofen for generalized soreness. We discussed the need for good handwashing, and good hydration.    Final diagnoses:  None    **I have reviewed nursing notes, vital signs, and all appropriate lab and imaging results for this patient.Ivery Quale, PA-C 10/12/15 1941  Samuel Jester, DO 10/14/15 [phone]

## 2015-10-12 NOTE — ED Notes (Signed)
Pt seen and evaluated by EDPa for initial assessment. 

## 2015-11-06 ENCOUNTER — Emergency Department (HOSPITAL_COMMUNITY)
Admission: EM | Admit: 2015-11-06 | Discharge: 2015-11-06 | Disposition: A | Discharge status: Home / Self Care | Payer: Self-pay | Attending: Emergency Medicine | Admitting: Emergency Medicine

## 2015-11-06 ENCOUNTER — Encounter (HOSPITAL_COMMUNITY): Payer: Self-pay

## 2015-11-06 ENCOUNTER — Emergency Department (HOSPITAL_COMMUNITY): Payer: Self-pay

## 2015-11-06 DIAGNOSIS — R1010 Upper abdominal pain, unspecified: Secondary | ICD-10-CM | POA: Insufficient documentation

## 2015-11-06 DIAGNOSIS — Z79899 Other long term (current) drug therapy: Secondary | ICD-10-CM | POA: Insufficient documentation

## 2015-11-06 DIAGNOSIS — R112 Nausea with vomiting, unspecified: Secondary | ICD-10-CM | POA: Insufficient documentation

## 2015-11-06 DIAGNOSIS — Z87891 Personal history of nicotine dependence: Secondary | ICD-10-CM | POA: Insufficient documentation

## 2015-11-06 LAB — CBC WITH DIFFERENTIAL/PLATELET
BASOS PCT: 0 %
Basophils Absolute: 0 10*3/uL (ref 0.0–0.1)
Eosinophils Absolute: 0.2 10*3/uL (ref 0.0–0.7)
Eosinophils Relative: 3 %
HEMATOCRIT: 38.7 % (ref 36.0–46.0)
HEMOGLOBIN: 12.9 g/dL (ref 12.0–15.0)
LYMPHS ABS: 2.4 10*3/uL (ref 0.7–4.0)
Lymphocytes Relative: 30 %
MCH: 26.4 pg (ref 26.0–34.0)
MCHC: 33.3 g/dL (ref 30.0–36.0)
MCV: 79.3 fL (ref 78.0–100.0)
MONO ABS: 0.4 10*3/uL (ref 0.1–1.0)
MONOS PCT: 5 %
NEUTROS ABS: 4.8 10*3/uL (ref 1.7–7.7)
NEUTROS PCT: 62 %
Platelets: 275 10*3/uL (ref 150–400)
RBC: 4.88 MIL/uL (ref 3.87–5.11)
RDW: 13.1 % (ref 11.5–15.5)
WBC: 7.8 10*3/uL (ref 4.0–10.5)

## 2015-11-06 LAB — COMPREHENSIVE METABOLIC PANEL
ALBUMIN: 3.8 g/dL (ref 3.5–5.0)
ALK PHOS: 44 U/L (ref 38–126)
ALT: 20 U/L (ref 14–54)
ANION GAP: 5 (ref 5–15)
AST: 20 U/L (ref 15–41)
BILIRUBIN TOTAL: 0.9 mg/dL (ref 0.3–1.2)
BUN: 9 mg/dL (ref 6–20)
CALCIUM: 8.5 mg/dL — AB (ref 8.9–10.3)
CHLORIDE: 109 mmol/L (ref 101–111)
CO2: 27 mmol/L (ref 22–32)
CREATININE: 0.48 mg/dL (ref 0.44–1.00)
GFR calc non Af Amer: >60 mL/min (ref >60)
GLUCOSE: 97 mg/dL (ref 65–99)
Potassium: 3.7 mmol/L (ref 3.5–5.1)
Sodium: 141 mmol/L (ref 135–145)
Total Protein: 6.9 g/dL (ref 6.5–8.1)

## 2015-11-06 LAB — URINALYSIS, ROUTINE W REFLEX MICROSCOPIC
Bilirubin Urine: NEGATIVE
GLUCOSE, UA: NEGATIVE mg/dL
Ketones, ur: NEGATIVE mg/dL
LEUKOCYTES UA: NEGATIVE
Nitrite: NEGATIVE
PH: 5.5 (ref 5.0–8.0)
Protein, ur: NEGATIVE mg/dL

## 2015-11-06 LAB — PREGNANCY, URINE: Preg Test, Ur: NEGATIVE

## 2015-11-06 LAB — URINE MICROSCOPIC-ADD ON

## 2015-11-06 LAB — LIPASE, BLOOD: Lipase: 17 U/L (ref 11–51)

## 2015-11-06 MED ORDER — GI COCKTAIL ~~LOC~~
30.0000 mL | Freq: Once | ORAL | Status: AC
  Administered 2015-11-06: 30 mL via ORAL
  Filled 2015-11-06: qty 30

## 2015-11-06 MED ORDER — ONDANSETRON 8 MG PO TBDP
8.0000 mg | ORAL_TABLET | Freq: Once | ORAL | Status: AC
  Administered 2015-11-06: 8 mg via ORAL
  Filled 2015-11-06: qty 1

## 2015-11-06 MED ORDER — ONDANSETRON HCL 4 MG PO TABS: 4.0000 mg | ORAL_TABLET | Freq: Three times a day (TID) | ORAL | Status: DC | PRN

## 2015-11-06 NOTE — ED Notes (Signed)
Gave pt a sprite for a fluid challenge

## 2015-11-06 NOTE — ED Notes (Signed)
Pt c/o vomiting and upper abd pain that started this morning.  Denies diarrhea.

## 2015-11-06 NOTE — ED Notes (Signed)
Pt tolerating fluids well at this time. 

## 2015-11-06 NOTE — Discharge Instructions (Signed)
Take the prescription as directed.  Increase your fluid intake (ie:  Gatoraide) for the next few days.  Eat a bland diet and advance to your regular diet slowly as you can tolerate it. Call your regular medical doctor tomorrow to schedule a follow up appointment in the next 2 days.  Return to the Emergency Department immediately if not improving (or even worsening) despite taking the medicines as prescribed, any black or bloody stool or vomit, if you develop a fever over "101," or for any other concerns. ° °

## 2015-11-06 NOTE — ED Provider Notes (Signed)
CSN: [id number]     Arrival date & time 11/06/15  1450 History   First MD Initiated Contact with Patient 11/06/15 1617     Chief Complaint  Patient presents with  . Emesis      HPI Pt was seen at 1615. Per pt, c/o gradual onset and persistence of multiple intermittent episodes of N/V that began this morning after eating fast food breakfast. Has been associated with upper abd "aching" pain.  Denies diarrhea, no CP/SOB, no back pain, no fevers, no black or blood in stools or emesis.    Past Medical History  Diagnosis Date  . Obesity   . Carpal tunnel syndrome   . Migraine    Past Surgical History  Procedure Laterality Date  . Cholecystectomy    . Cesarean section    . Tubal ligation    . Wisdom tooth extraction    . Cesarean section    . Mouth surgery     Family History  Problem Relation Age of Onset  . Diabetes Mother   . Hypertension Mother   . Hypertension Father    Social History  Substance Use Topics  . Smoking status: Former Smoker -- 0.04 packs/day for 1.5 years    Types: Cigarettes    Quit date: 11/21/2010  . Smokeless tobacco: Never Used  . Alcohol Use: No   OB History    Gravida Para Term Preterm AB TAB SAB Ectopic Multiple Living   2 2 2       2      Review of Systems ROS: Statement: All systems negative except as marked or noted in the HPI; Constitutional: Negative for fever and chills. ; ; Eyes: Negative for eye pain, redness and discharge. ; ; ENMT: Negative for ear pain, hoarseness, nasal congestion, sinus pressure and sore throat. ; ; Cardiovascular: Negative for chest pain, palpitations, diaphoresis, dyspnea and peripheral edema. ; ; Respiratory: Negative for cough, wheezing and stridor. ; ; Gastrointestinal: +N/V, abd pain. Negative for diarrhea, blood in stool, hematemesis, jaundice and rectal bleeding. . ; ; Genitourinary: Negative for dysuria, flank pain and hematuria. ; ; Musculoskeletal: Negative for back pain and neck pain. Negative for swelling  and trauma.; ; Skin: Negative for pruritus, rash, abrasions, blisters, bruising and skin lesion.; ; Neuro: Negative for headache, lightheadedness and neck stiffness. Negative for weakness, altered level of consciousness, altered mental status, extremity weakness, paresthesias, involuntary movement, seizure and syncope.      Allergies  Coconut flavor and Pineapple  Home Medications   Prior to Admission medications   Medication Sig Start Date End Date Taking? Authorizing Provider  acyclovir (ZOVIRAX) 400 MG tablet Take 1 tablet (400 mg total) by mouth 3 (three) times daily. 08/15/15   Donita BrooksWarren T Pickard, MD  amoxicillin (AMOXIL) 500 MG capsule Take 1 capsule (500 mg total) by mouth 3 (three) times daily. 10/12/15   Ivery QualeHobson Bryant, PA-C  fluconazole (DIFLUCAN) 150 MG tablet Take 1 tablet (150 mg total) by mouth once. 08/15/15   Donita BrooksWarren T Pickard, MD  guaiFENesin (MUCINEX) 600 MG 12 hr tablet Take by mouth 2 (two) times daily.    Historical Provider, MD  ibuprofen (ADVIL,MOTRIN) 200 MG tablet Take 800 mg by mouth every 6 (six) hours as needed.    Historical Provider, MD  loratadine-pseudoephedrine (CLARITIN-D 12 HOUR) 5-120 MG tablet Take 1 tablet by mouth 2 (two) times daily. 10/12/15   Ivery QualeHobson Bryant, PA-C  metroNIDAZOLE (FLAGYL) 500 MG tablet Take 1 tablet (500 mg total) by mouth 3 (  three) times daily. 08/15/15   Donita Brooks, MD  promethazine-dextromethorphan (PROMETHAZINE-DM) 6.25-15 MG/5ML syrup Take 5 mLs by mouth every 6 (six) hours as needed for cough. 10/12/15   Ivery Quale, PA-C  valACYclovir (VALTREX) 500 MG tablet Take 500 mg by mouth 2 (two) times daily.    Historical Provider, MD   BP 131/88 mmHg  Pulse 79  Temp(Src) 98.2 F (36.8 C) (Oral)  Resp 20  Ht 5\' 7"  (1.702 m)  Wt 230 lb (104.327 kg)  BMI 36.01 kg/m2  SpO2 100%  LMP 10/25/2015 Physical Exam  1620: Physical examination:  Nursing notes reviewed; Vital signs and O2 SAT reviewed;  Constitutional: Well developed, Well  nourished, Well hydrated, In no acute distress; Head:  Normocephalic, atraumatic; Eyes: EOMI, PERRL, No scleral icterus; ENMT: Mouth and pharynx normal, Mucous membranes moist; Neck: Supple, Full range of motion, No lymphadenopathy; Cardiovascular: Regular rate and rhythm, No murmur, rub, or gallop; Respiratory: Breath sounds clear & equal bilaterally, No rales, rhonchi, wheezes.  Speaking full sentences with ease, Normal respiratory effort/excursion; Chest: Nontender, Movement normal; Abdomen: Soft, +LUQ and mid-epigastric tenderness to palp. No rebound or guarding. Nondistended, Normal bowel sounds; Genitourinary: No CVA tenderness; Extremities: Pulses normal, No tenderness, No edema, No calf edema or asymmetry.; Neuro: AA&Ox3, Major CN grossly intact.  Speech clear. No gross focal motor or sensory deficits in extremities. Climbs on and off stretcher easily by herself. Gait steady.; Skin: Color normal, Warm, Dry.   ED Course  Procedures (including critical care time) Labs Review   Imaging Review  I have personally reviewed and evaluated these images and lab results as part of my medical decision-making.   EKG Interpretation None      MDM  MDM Reviewed: previous chart, nursing note and vitals Reviewed previous: labs Interpretation: labs and x-ray      Results for orders placed or performed during the hospital encounter of 11/06/15  Comprehensive metabolic panel  Result Value Ref Range   Sodium 141 135 - 145 mmol/L   Potassium 3.7 3.5 - 5.1 mmol/L   Chloride 109 101 - 111 mmol/L   CO2 27 22 - 32 mmol/L   Glucose, Bld 97 65 - 99 mg/dL   BUN 9 6 - 20 mg/dL   Creatinine, Ser 1.61 0.44 - 1.00 mg/dL   Calcium 8.5 (L) 8.9 - 10.3 mg/dL   Total Protein 6.9 6.5 - 8.1 g/dL   Albumin 3.8 3.5 - 5.0 g/dL   AST 20 15 - 41 U/L   ALT 20 14 - 54 U/L   Alkaline Phosphatase 44 38 - 126 U/L   Total Bilirubin 0.9 0.3 - 1.2 mg/dL   GFR calc non Af Amer >60 >60 mL/min   GFR calc Af Amer >60 >60  mL/min   Anion gap 5 5 - 15  CBC with Differential  Result Value Ref Range   WBC 7.8 4.0 - 10.5 K/uL   RBC 4.88 3.87 - 5.11 MIL/uL   Hemoglobin 12.9 12.0 - 15.0 g/dL   HCT 09.6 04.5 - 40.9 %   MCV 79.3 78.0 - 100.0 fL   MCH 26.4 26.0 - 34.0 pg   MCHC 33.3 30.0 - 36.0 g/dL   RDW 81.1 91.4 - 78.2 %   Platelets 275 150 - 400 K/uL   Neutrophils Relative % 62 %   Neutro Abs 4.8 1.7 - 7.7 K/uL   Lymphocytes Relative 30 %   Lymphs Abs 2.4 0.7 - 4.0 K/uL   Monocytes Relative 5 %  Monocytes Absolute 0.4 0.1 - 1.0 K/uL   Eosinophils Relative 3 %   Eosinophils Absolute 0.2 0.0 - 0.7 K/uL   Basophils Relative 0 %   Basophils Absolute 0.0 0.0 - 0.1 K/uL  Lipase, blood  Result Value Ref Range   Lipase 17 11 - 51 U/L  Pregnancy, urine  Result Value Ref Range   Preg Test, Ur NEGATIVE NEGATIVE  Urinalysis, Routine w reflex microscopic  Result Value Ref Range   Color, Urine YELLOW YELLOW   APPearance CLEAR CLEAR   Specific Gravity, Urine >1.030 (H) 1.005 - 1.030   pH 5.5 5.0 - 8.0   Glucose, UA NEGATIVE NEGATIVE mg/dL   Hgb urine dipstick TRACE (A) NEGATIVE   Bilirubin Urine NEGATIVE NEGATIVE   Ketones, ur NEGATIVE NEGATIVE mg/dL   Protein, ur NEGATIVE NEGATIVE mg/dL   Nitrite NEGATIVE NEGATIVE   Leukocytes, UA NEGATIVE NEGATIVE  Urine microscopic-add on  Result Value Ref Range   Squamous Epithelial / LPF 0-5 (A) NONE SEEN   WBC, UA 0-5 0 - 5 WBC/hpf   RBC / HPF 0-5 0 - 5 RBC/hpf   Bacteria, UA RARE (A) NONE SEEN   Dg Abd Acute W/chest 11/06/2015  CLINICAL DATA:  Upper left abdominal pain with nausea and vomiting today. History of cholecystectomy EXAM: DG ABDOMEN ACUTE W/ 1V CHEST COMPARISON:  Chest radiographs 10/08/2014. Lumbar spine radiographs 11/21/2013. Abdominal pelvic CT 01/19/2013. FINDINGS: The heart size and mediastinal contours are normal. The lungs are clear. There is no pleural effusion or pneumothorax. No acute osseous findings are identified. The bowel gas pattern is  normal. There is no free intraperitoneal air surgical clips are present from previous cholecystectomy and probable appendectomy. Right pelvic calcification is likely a phlebolith. There is a mild convex left lumbar scoliosis. IMPRESSION: No acute cardiopulmonary or abdominal process. Electronically Signed   By: Carey Bullocks M.D.   On: 11/06/2015 18:19    1915:  Pt has tol PO well while in the ED without N/V.  No stooling while in the ED.  Abd benign, VSS. Feels better and wants to go home now. Dx and testing d/w pt.  Questions answered.  Verb understanding, agreeable to d/c home with outpt f/u.    Samuel Jester, DO 11/09/15 0530

## 2016-01-01 ENCOUNTER — Emergency Department (HOSPITAL_COMMUNITY): Payer: Self-pay

## 2016-01-01 ENCOUNTER — Encounter (HOSPITAL_COMMUNITY): Payer: Self-pay | Admitting: Emergency Medicine

## 2016-01-01 ENCOUNTER — Emergency Department (HOSPITAL_COMMUNITY)
Admission: EM | Admit: 2016-01-01 | Discharge: 2016-01-01 | Disposition: A | Discharge status: Home / Self Care | Payer: Self-pay | Attending: Emergency Medicine | Admitting: Emergency Medicine

## 2016-01-01 DIAGNOSIS — Z792 Long term (current) use of antibiotics: Secondary | ICD-10-CM | POA: Insufficient documentation

## 2016-01-01 DIAGNOSIS — Z87891 Personal history of nicotine dependence: Secondary | ICD-10-CM | POA: Insufficient documentation

## 2016-01-01 DIAGNOSIS — M79662 Pain in left lower leg: Secondary | ICD-10-CM

## 2016-01-01 DIAGNOSIS — Z79899 Other long term (current) drug therapy: Secondary | ICD-10-CM | POA: Insufficient documentation

## 2016-01-01 DIAGNOSIS — M79672 Pain in left foot: Secondary | ICD-10-CM | POA: Insufficient documentation

## 2016-01-01 DIAGNOSIS — Z791 Long term (current) use of non-steroidal anti-inflammatories (NSAID): Secondary | ICD-10-CM | POA: Insufficient documentation

## 2016-01-01 DIAGNOSIS — M79605 Pain in left leg: Secondary | ICD-10-CM | POA: Insufficient documentation

## 2016-01-01 MED ORDER — HYDROCODONE-ACETAMINOPHEN 5-325 MG PO TABS: 2.0000 | ORAL_TABLET | ORAL | Status: DC | PRN

## 2016-01-01 MED ORDER — IBUPROFEN 800 MG PO TABS: 800.0000 mg | ORAL_TABLET | Freq: Three times a day (TID) | ORAL | Status: DC

## 2016-01-01 NOTE — Discharge Instructions (Signed)
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
## 2016-01-01 NOTE — ED Notes (Signed)
Patient refused crutches. States "I hate them, I can't use them, they hurt me. They will just sit in my house, I won't use them."

## 2016-01-01 NOTE — ED Notes (Signed)
PT stated left foot pain that started while walking last night. PT ambulatory in triage and denies any injury.

## 2016-01-12 ENCOUNTER — Emergency Department (HOSPITAL_COMMUNITY): Payer: No Typology Code available for payment source

## 2016-01-12 ENCOUNTER — Encounter (HOSPITAL_COMMUNITY): Payer: Self-pay | Admitting: Emergency Medicine

## 2016-01-12 ENCOUNTER — Emergency Department (HOSPITAL_COMMUNITY)
Admission: EM | Admit: 2016-01-12 | Discharge: 2016-01-12 | Disposition: A | Discharge status: Home / Self Care | Payer: No Typology Code available for payment source | Attending: Emergency Medicine | Admitting: Emergency Medicine

## 2016-01-12 DIAGNOSIS — S299XXA Unspecified injury of thorax, initial encounter: Secondary | ICD-10-CM | POA: Diagnosis present

## 2016-01-12 DIAGNOSIS — Y929 Unspecified place or not applicable: Secondary | ICD-10-CM | POA: Insufficient documentation

## 2016-01-12 DIAGNOSIS — Y939 Activity, unspecified: Secondary | ICD-10-CM | POA: Diagnosis not present

## 2016-01-12 DIAGNOSIS — R109 Unspecified abdominal pain: Secondary | ICD-10-CM | POA: Insufficient documentation

## 2016-01-12 DIAGNOSIS — Z87891 Personal history of nicotine dependence: Secondary | ICD-10-CM | POA: Diagnosis not present

## 2016-01-12 DIAGNOSIS — Y999 Unspecified external cause status: Secondary | ICD-10-CM | POA: Insufficient documentation

## 2016-01-12 DIAGNOSIS — S2232XA Fracture of one rib, left side, initial encounter for closed fracture: Secondary | ICD-10-CM | POA: Diagnosis not present

## 2016-01-12 LAB — I-STAT BETA HCG BLOOD, ED (MC, WL, AP ONLY): I-stat hCG, quantitative: <5 m[IU]/mL (ref <5)

## 2016-01-12 LAB — CBC WITH DIFFERENTIAL/PLATELET
BASOS ABS: 0 10*3/uL (ref 0.0–0.1)
BASOS PCT: 0 %
Eosinophils Absolute: 0.2 10*3/uL (ref 0.0–0.7)
Eosinophils Relative: 2 %
HEMATOCRIT: 38.9 % (ref 36.0–46.0)
HEMOGLOBIN: 13 g/dL (ref 12.0–15.0)
Lymphocytes Relative: 31 %
Lymphs Abs: 2.8 10*3/uL (ref 0.7–4.0)
MCH: 26.4 pg (ref 26.0–34.0)
MCHC: 33.4 g/dL (ref 30.0–36.0)
MCV: 78.9 fL (ref 78.0–100.0)
MONOS PCT: 6 %
Monocytes Absolute: 0.5 10*3/uL (ref 0.1–1.0)
NEUTROS ABS: 5.3 10*3/uL (ref 1.7–7.7)
NEUTROS PCT: 61 %
Platelets: 285 10*3/uL (ref 150–400)
RBC: 4.93 MIL/uL (ref 3.87–5.11)
RDW: 13.7 % (ref 11.5–15.5)
WBC: 8.8 10*3/uL (ref 4.0–10.5)

## 2016-01-12 LAB — BASIC METABOLIC PANEL
ANION GAP: 4 — AB (ref 5–15)
BUN: 10 mg/dL (ref 6–20)
CALCIUM: 8.6 mg/dL — AB (ref 8.9–10.3)
CO2: 26 mmol/L (ref 22–32)
Chloride: 107 mmol/L (ref 101–111)
Creatinine, Ser: 0.64 mg/dL (ref 0.44–1.00)
GFR calc Af Amer: >60 mL/min (ref >60)
GFR calc non Af Amer: >60 mL/min (ref >60)
GLUCOSE: 91 mg/dL (ref 65–99)
Potassium: 3.5 mmol/L (ref 3.5–5.1)
Sodium: 137 mmol/L (ref 135–145)

## 2016-01-12 MED ORDER — IOPAMIDOL (ISOVUE-300) INJECTION 61%
100.0000 mL | Freq: Once | INTRAVENOUS | Status: AC | PRN
  Administered 2016-01-12: 100 mL via INTRAVENOUS

## 2016-01-12 MED ORDER — IBUPROFEN 400 MG PO TABS: 400.0000 mg | ORAL_TABLET | Freq: Four times a day (QID) | ORAL | 0 refills | Status: DC | PRN

## 2016-01-12 MED ORDER — HYDROMORPHONE HCL 1 MG/ML IJ SOLN
1.0000 mg | Freq: Once | INTRAMUSCULAR | Status: AC
  Administered 2016-01-12: 1 mg via INTRAVENOUS
  Filled 2016-01-12: qty 1

## 2016-01-12 MED ORDER — HYDROCODONE-ACETAMINOPHEN 5-325 MG PO TABS: 1.0000 | ORAL_TABLET | ORAL | 0 refills | Status: DC | PRN
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
## 2016-01-12 NOTE — ED Notes (Signed)
MD at bedside. 

## 2016-04-23 ENCOUNTER — Encounter (HOSPITAL_COMMUNITY): Payer: Self-pay | Admitting: Emergency Medicine

## 2016-04-23 ENCOUNTER — Emergency Department (HOSPITAL_COMMUNITY)
Admission: EM | Admit: 2016-04-23 | Discharge: 2016-04-23 | Disposition: A | Discharge status: Home / Self Care | Payer: Self-pay | Attending: Emergency Medicine | Admitting: Emergency Medicine

## 2016-04-23 DIAGNOSIS — Z87891 Personal history of nicotine dependence: Secondary | ICD-10-CM | POA: Insufficient documentation

## 2016-04-23 DIAGNOSIS — Z791 Long term (current) use of non-steroidal anti-inflammatories (NSAID): Secondary | ICD-10-CM | POA: Insufficient documentation

## 2016-04-23 DIAGNOSIS — L03116 Cellulitis of left lower limb: Secondary | ICD-10-CM | POA: Insufficient documentation

## 2016-04-23 MED ORDER — CEPHALEXIN 500 MG PO CAPS: 500.0000 mg | ORAL_CAPSULE | Freq: Four times a day (QID) | ORAL | 0 refills | Status: AC

## 2016-04-23 NOTE — ED Notes (Signed)
Pt ambulatory to waiting room. Pt verbalized understanding of discharge instructions.   

## 2016-04-23 NOTE — ED Triage Notes (Signed)
Pt with small reddened area to her L anterior lower leg. Pt denies known injury.

## 2016-04-23 NOTE — Discharge Instructions (Signed)
You likely have small skin infection.  Take antibiotics as prescribed. Take ibuprofen and tylenol for pain  Return for worsening symptoms, including fever, worsening redness or swelling or any other symptoms concerning to you.

## 2016-04-23 NOTE — ED Provider Notes (Signed)
[facility] Provider Note   CSN: [id number] Arrival date & time: 04/23/16  1928     History   Chief Complaint Chief Complaint  Patient presents with  . knot to L anterior leg    HPI Stacy [name] is a 30 y.o. female.  HPI  30 year old female who presents with "knot" to the left lower leg. Onset today, when she noticed that she had pain in her leg with ambulation. Noticed area of redness and swelling to anterior portion of the left lower leg. No fever or chills. No fall or trauma. States burning pain when palpated. Has not tried medications prior to arrival. Otherwise in her usual state of health. History of migraines and obesity. No history of immune suppression.  Past Medical History:  Diagnosis Date  . Carpal tunnel syndrome   . Migraine   . Obesity     Patient Active Problem List   Diagnosis Date Noted  . HSV-2 (herpes simplex virus 2) infection 08/07/2015  . Precordial pain 05/31/2013  . Obesity   . Carpal tunnel syndrome     Past Surgical History:  Procedure Laterality Date  . CESAREAN SECTION    . CESAREAN SECTION    . CHOLECYSTECTOMY    . MOUTH SURGERY    . TUBAL LIGATION    . WISDOM TOOTH EXTRACTION      OB History    Gravida Para Term Preterm AB Living   2 2 2     2    SAB TAB Ectopic Multiple Live Births                   Home Medications    Prior to Admission medications   Medication Sig Start Date End Date Taking? Authorizing Provider  cephALEXin (KEFLEX) 500 MG capsule Take 1 capsule (500 mg total) by mouth 4 (four) times daily. 04/23/16 04/28/16  Lavera Guiseana Duo Arnaldo Heffron, MD  HYDROcodone-acetaminophen (NORCO/VICODIN) 5-325 MG tablet Take 1 tablet by mouth every 4 (four) hours as needed. 01/12/16   Linwood DibblesJon Knapp, MD  ibuprofen (ADVIL,MOTRIN) 400 MG tablet Take 1 tablet (400 mg total) by mouth every 6 (six) hours as needed. 01/12/16   Linwood DibblesJon Knapp, MD    Family History Family History  Problem Relation Age of Onset  . Diabetes Mother   . Hypertension  Mother   . Hypertension Father     Social History Social History  Substance Use Topics  . Smoking status: Former Smoker    Packs/day: 0.04    Years: 1.50    Types: Cigarettes    Quit date: 11/21/2010  . Smokeless tobacco: Never Used  . Alcohol use No     Allergies   Coconut flavor and Pineapple   Review of Systems Review of Systems  Constitutional: Negative for fever.  Gastrointestinal: Negative for nausea and vomiting.  Skin: Positive for rash.  Allergic/Immunologic: Negative for immunocompromised state.  Hematological: Does not bruise/bleed easily.  All other systems reviewed and are negative.    Physical Exam Updated Vital Signs BP 132/83 (BP Location: Left Arm)   Pulse 63   Temp 97.8 F (36.6 C) (Oral)   Resp 17   Ht 5\' 7"  (1.702 m)   Wt 240 lb (108.9 kg)   LMP 02/25/2016   SpO2 100%   BMI 37.59 kg/m   Physical Exam Physical Exam  Nursing note and vitals reviewed. Constitutional: Well developed, well nourished, non-toxic, and in no acute distress Head: Normocephalic and atraumatic.  Mouth/Throat: Oropharynx is clear  and moist.  Neck: Normal range of motion. Neck supple.  Cardiovascular: Normal rate and regular rhythm.   Pulmonary/Chest: Effort normal   Abdominal: Soft. There is no tenderness.  Musculoskeletal: Normal range of motion of LLE.  Neurological: Alert, no facial droop, fluent speech Skin: Skin is warm and dry. there is 4 x 4 cm area of erythema warmth and tenderness over left anterior skin. No palpable mass. No underlying fluctuance. Psychiatric: Cooperative   ED Treatments / Results  Labs (all labs ordered are listed, but only abnormal results are displayed) Labs Reviewed - No data to display  EKG  EKG Interpretation None       Radiology No results found.  Procedures Procedures (including critical care time) EMERGENCY DEPARTMENT US SOFT TISSUE INTERPRETATION "Study: Limited Ultrasound of the noted body part in comments  below"  INDICATIONS: Pain Multiple views of the body part are obtained with a multi-frequency linear probe  PERFORMED BY:  Myself  IMAGES ARCHIVED?: No  SIDE:Left  BODY PART:Lower extremity  FINDINGS: No abcess noted   INTERPRETATION:  No abcess noted and Cellulitis present  COMMENT:  n/a    Medications Ordered in ED Medications - No data to display   Initial Impression / Assessment and Plan / ED Course  I have reviewed the triage vital signs and the nursing notes.  Pertinent labs & imaging results that were available during my care of the patient were reviewed by me and considered in my medical decision making (see chart for details).  Clinical Course    Presenting with lesion to the left anterior legs. Appears to be cellulitis. US bedside shows no abscess or underlying mass/lesions. Well appearing, vital signs normal. Will treat outpatient with course of keflex.   The patient appears reasonably screened and/or stabilized for discharge and I doubt any other medical condition or other [facility] requiring further screening, evaluation, or treatment in the ED at this time prior to discharge.  Strict return and follow-up instructions reviewed. She expressed understanding of all discharge instructions and felt comfortable with the plan of care.   Final Clinical Impressions(s) / ED Diagnoses   Final diagnoses:  Cellulitis of left lower extremity    New Prescriptions Discharge Medication List as of 04/23/2016  8:55 PM    START taking these medications   Details  cephALEXin (KEFLEX) 500 MG capsule Take 1 capsule (500 mg total) by mouth 4 (four) times daily., Starting Thu 04/23/2016, Until Tue 04/28/2016, Print         Lavera Guiseana Duo Jaevin Medearis, MD 04/23/16 2122

## 2016-05-03 ENCOUNTER — Emergency Department (HOSPITAL_COMMUNITY)
Admission: EM | Admit: 2016-05-03 | Discharge: 2016-05-03 | Disposition: A | Discharge status: Home / Self Care | Payer: Self-pay | Attending: Emergency Medicine | Admitting: Emergency Medicine

## 2016-05-03 ENCOUNTER — Encounter (HOSPITAL_COMMUNITY): Payer: Self-pay | Admitting: *Deleted

## 2016-05-03 DIAGNOSIS — Z791 Long term (current) use of non-steroidal anti-inflammatories (NSAID): Secondary | ICD-10-CM | POA: Insufficient documentation

## 2016-05-03 DIAGNOSIS — R519 Headache, unspecified: Secondary | ICD-10-CM

## 2016-05-03 DIAGNOSIS — Z87891 Personal history of nicotine dependence: Secondary | ICD-10-CM | POA: Insufficient documentation

## 2016-05-03 DIAGNOSIS — R51 Headache: Secondary | ICD-10-CM | POA: Insufficient documentation

## 2016-05-03 MED ORDER — KETOROLAC TROMETHAMINE 30 MG/ML IJ SOLN
30.0000 mg | Freq: Once | INTRAMUSCULAR | Status: AC
  Administered 2016-05-03: 30 mg via INTRAVENOUS
  Filled 2016-05-03: qty 1

## 2016-05-03 MED ORDER — DEXAMETHASONE SODIUM PHOSPHATE 10 MG/ML IJ SOLN
10.0000 mg | Freq: Once | INTRAMUSCULAR | Status: AC
  Administered 2016-05-03: 10 mg via INTRAVENOUS
  Filled 2016-05-03: qty 1

## 2016-05-03 MED ORDER — DIPHENHYDRAMINE HCL 50 MG/ML IJ SOLN
25.0000 mg | Freq: Once | INTRAMUSCULAR | Status: AC
  Administered 2016-05-03: 25 mg via INTRAVENOUS
  Filled 2016-05-03: qty 1

## 2016-05-03 MED ORDER — PROCHLORPERAZINE EDISYLATE 5 MG/ML IJ SOLN
10.0000 mg | Freq: Once | INTRAMUSCULAR | Status: AC
  Administered 2016-05-03: 10 mg via INTRAVENOUS
  Filled 2016-05-03: qty 2

## 2016-05-03 MED ORDER — SODIUM CHLORIDE 0.9 % IV BOLUS (SEPSIS)
1000.0000 mL | Freq: Once | INTRAVENOUS | Status: AC
  Administered 2016-05-03: 1000 mL via INTRAVENOUS

## 2016-05-03 NOTE — ED Triage Notes (Signed)
Pt reports migraine headache x 3 days. Pt reports when she stands up at times she feels as if she's going to "pass out due to the pain". Light and loud sound sensitivity. Pt has tried Ibuprofen at home with no relief.

## 2016-05-03 NOTE — ED Provider Notes (Signed)
[facility] Provider Note   CSN: [id number] Arrival date & time: 05/03/16  1120  By signing my name below, I, [name], attest that this documentation has been prepared under the direction and in the presence of Marily MemosJason Elvis Boot, MD  Electronically Signed: Clovis PuAvnee Patel, ED Scribe. 05/03/16. 12:37 PM.   History   Chief Complaint Chief Complaint  Patient presents with  . Migraine    The history is provided by the patient. No language interpreter was used.   HPI Comments:  Stacy [name] is a 30 y.o. female who presents to the Emergency Department complaining of sudden onset, intermittent migraines x 3 days. Pt states she first noticed her symptoms when she was at work. Pain is exacerbated due to photophobia and phonophobia. Pt has taken ibuprofen with no relief. Pt denies chest pain, abdominal pain, back pain, neck pain, numbness, weakness, vision changes, rash and any other symptoms at this time.   Past Medical History:  Diagnosis Date  . Carpal tunnel syndrome   . Migraine   . Obesity     Patient Active Problem List   Diagnosis Date Noted  . HSV-2 (herpes simplex virus 2) infection 08/07/2015  . Precordial pain 05/31/2013  . Obesity   . Carpal tunnel syndrome     Past Surgical History:  Procedure Laterality Date  . CESAREAN SECTION    . CESAREAN SECTION    . CHOLECYSTECTOMY    . MOUTH SURGERY    . TUBAL LIGATION    . WISDOM TOOTH EXTRACTION      OB History    Gravida Para Term Preterm AB Living   2 2 2     2    SAB TAB Ectopic Multiple Live Births                   Home Medications    Prior to Admission medications   Medication Sig Start Date End Date Taking? Authorizing Provider  ibuprofen (ADVIL,MOTRIN) 200 MG tablet Take 800 mg by mouth every 6 (six) hours as needed for headache.   Yes Historical Provider, MD    Family History Family History  Problem Relation Age of Onset  . Diabetes Mother   . Hypertension Mother   . Hypertension Father       Social History Social History  Substance Use Topics  . Smoking status: Former Smoker    Packs/day: 0.04    Years: 1.50    Types: Cigarettes    Quit date: 11/21/2010  . Smokeless tobacco: Never Used  . Alcohol use No     Allergies   Coconut flavor and Pineapple   Review of Systems Review of Systems  Eyes: Positive for photophobia. Negative for visual disturbance.  Cardiovascular: Negative for chest pain.  Gastrointestinal: Negative for abdominal pain.  Musculoskeletal: Negative for back pain and neck pain.  Skin: Negative for rash.  Neurological: Positive for headaches. Negative for weakness and numbness.  All other systems reviewed and are negative.    Physical Exam Updated Vital Signs BP 128/84   Pulse (!) 49   Temp 98.1 F (36.7 C) (Oral)   Resp 18   Ht 5\' 7"  (1.702 m)   Wt 230 lb (104.3 kg)   LMP 02/16/2016   SpO2 100%   BMI 36.02 kg/m   Physical Exam  Constitutional: She is oriented to person, place, and time. She appears well-developed and well-nourished. No distress.  HENT:  Head: Normocephalic and atraumatic.  Eyes: Conjunctivae are normal.  Cardiovascular: Normal  rate, regular rhythm and normal heart sounds.   Pulmonary/Chest: Effort normal and breath sounds normal.  Abdominal: She exhibits no distension.  Neurological: She is alert and oriented to person, place, and time. No cranial nerve deficit or sensory deficit.  Normal upper and lower motor and sensory. Normal patellar reflex. Normal cranial nerves   Skin: Skin is warm and dry.  Psychiatric: She has a normal mood and affect.  Nursing note and vitals reviewed.    ED Treatments / Results  DIAGNOSTIC STUDIES:  Oxygen Saturation is 100% on RA, normal by my interpretation.    COORDINATION OF CARE:  12:35 PM Discussed treatment plan with pt at bedside and pt agreed to plan.  Labs (all labs ordered are listed, but only abnormal results are displayed) Labs Reviewed - No data to  display  EKG  EKG Interpretation None       Radiology No results found.  Procedures Procedures (including critical care time)  Medications Ordered in ED Medications  prochlorperazine (COMPAZINE) injection 10 mg (10 mg Intravenous Given 05/03/16 1258)  sodium chloride 0.9 % bolus 1,000 mL (1,000 mLs Intravenous New Bag/Given 05/03/16 1258)  ketorolac (TORADOL) 30 MG/ML injection 30 mg (30 mg Intravenous Given 05/03/16 1258)  diphenhydrAMINE (BENADRYL) injection 25 mg (25 mg Intravenous Given 05/03/16 1259)  dexamethasone (DECADRON) injection 10 mg (10 mg Intravenous Given 05/03/16 1259)     Initial Impression / Assessment and Plan / ED Course  I have reviewed the triage vital signs and the nursing notes.  Pertinent labs & imaging results that were available during my care of the patient were reviewed by me and considered in my medical decision making (see chart for details).  Clinical Course     The patient arrived with signs and symptoms consistent with a migraine headache. The patient has history of migraines. This feels like previous migraines.The patient has a reassuring neuro exam lessening chances of intracranial abnormalities. The patient was given decadron, Compazine, Benadryl with patient's pain significantly improved and is ready for discharge. The patient remained in no acute distress, hemodynamically stable with reassuring neuro exam. The patient was then discharged from the Emergency Department with no further acute issues. Recommend follow up with neurologist or PCP within the next week.    Final Clinical Impressions(s) / ED Diagnoses   Final diagnoses:  Nonintractable headache, unspecified chronicity pattern, unspecified headache type    New Prescriptions New Prescriptions   No medications on file  I personally performed the services described in this documentation, which was scribed in my presence. The recorded information has been reviewed and is  accurate.     Marily MemosJason Arieona Swaggerty, MD 05/03/16 1355

## 2016-05-13 ENCOUNTER — Other Ambulatory Visit: Payer: Self-pay | Admitting: Family Medicine

## 2016-05-13 DIAGNOSIS — B009 Herpesviral infection, unspecified: Secondary | ICD-10-CM

## 2016-07-15 ENCOUNTER — Emergency Department (HOSPITAL_COMMUNITY)
Admission: EM | Admit: 2016-07-15 | Discharge: 2016-07-15 | Disposition: A | Discharge status: Home / Self Care | Payer: Self-pay | Attending: Emergency Medicine | Admitting: Emergency Medicine

## 2016-07-15 ENCOUNTER — Emergency Department (HOSPITAL_COMMUNITY): Payer: Self-pay

## 2016-07-15 ENCOUNTER — Encounter (HOSPITAL_COMMUNITY): Payer: Self-pay | Admitting: Emergency Medicine

## 2016-07-15 DIAGNOSIS — F17210 Nicotine dependence, cigarettes, uncomplicated: Secondary | ICD-10-CM | POA: Insufficient documentation

## 2016-07-15 DIAGNOSIS — R079 Chest pain, unspecified: Secondary | ICD-10-CM | POA: Insufficient documentation

## 2016-07-15 LAB — CBC WITH DIFFERENTIAL/PLATELET
BASOS ABS: 0 10*3/uL (ref 0.0–0.1)
Basophils Relative: 0 %
EOS ABS: 0.2 10*3/uL (ref 0.0–0.7)
Eosinophils Relative: 2 %
HCT: 34.6 % — ABNORMAL LOW (ref 36.0–46.0)
Hemoglobin: 12 g/dL (ref 12.0–15.0)
Lymphocytes Relative: 29 %
Lymphs Abs: 3 10*3/uL (ref 0.7–4.0)
MCH: 26.9 pg (ref 26.0–34.0)
MCHC: 34.7 g/dL (ref 30.0–36.0)
MCV: 77.6 fL — ABNORMAL LOW (ref 78.0–100.0)
MONO ABS: 0.5 10*3/uL (ref 0.1–1.0)
Monocytes Relative: 5 %
Neutro Abs: 6.5 10*3/uL (ref 1.7–7.7)
Neutrophils Relative %: 64 %
Platelets: 271 10*3/uL (ref 150–400)
RBC: 4.46 MIL/uL (ref 3.87–5.11)
RDW: 13.3 % (ref 11.5–15.5)
WBC: 10.2 10*3/uL (ref 4.0–10.5)

## 2016-07-15 LAB — BASIC METABOLIC PANEL
Anion gap: 6 (ref 5–15)
BUN: 6 mg/dL (ref 6–20)
CALCIUM: 8.3 mg/dL — AB (ref 8.9–10.3)
CO2: 24 mmol/L (ref 22–32)
CREATININE: 0.6 mg/dL (ref 0.44–1.00)
Chloride: 105 mmol/L (ref 101–111)
GFR calc Af Amer: >60 mL/min (ref >60)
Glucose, Bld: 94 mg/dL (ref 65–99)
Potassium: 3.3 mmol/L — ABNORMAL LOW (ref 3.5–5.1)
SODIUM: 135 mmol/L (ref 135–145)

## 2016-07-15 LAB — TROPONIN I

## 2016-07-15 MED ORDER — NAPROXEN 500 MG PO TABS: 500.0000 mg | ORAL_TABLET | Freq: Two times a day (BID) | ORAL | 0 refills | Status: DC

## 2016-07-15 NOTE — ED Provider Notes (Signed)
[facility] Provider Note   CSN: [id number] Arrival date & time: 07/15/16  2055     History   Chief Complaint Chief Complaint  Patient presents with  . Chest Pain    HPI Stacy [name] is a 31 y.o. female.  HPI   Stacy [name] is a 31 y.o. female who presents to the Emergency Department complaining of gradual onset of left upper chest pain that began approximately 2 hrs prior to arrival.  She describes a sharp, "stabbing" pain to the left upper chest that radiates to her left scapula.  Pain is somewhat reproduced with palpation and aggravated by deep breaths at times.  She denies known injury or recent excessive straining or lifting. Pain does not radiate to her left arm.  She took one ibuprofen at onset which did not help her pain.  She denies recent illness, cough, vomiting, diaphoresis, numbness of the extremity and shortness of breath.   Past Medical History:  Diagnosis Date  . Carpal tunnel syndrome   . Migraine   . Obesity     Patient Active Problem List   Diagnosis Date Noted  . HSV-2 (herpes simplex virus 2) infection 08/07/2015  . Precordial pain 05/31/2013  . Obesity   . Carpal tunnel syndrome     Past Surgical History:  Procedure Laterality Date  . CESAREAN SECTION    . CESAREAN SECTION    . CHOLECYSTECTOMY    . MOUTH SURGERY    . TUBAL LIGATION    . WISDOM TOOTH EXTRACTION      OB History    Gravida Para Term Preterm AB Living   2 2 2     2    SAB TAB Ectopic Multiple Live Births                   Home Medications    Prior to Admission medications   Medication Sig Start Date End Date Taking? Authorizing Provider  acyclovir (ZOVIRAX) 400 MG tablet TAKE 1 TABLET BY MOUTH 3 TIMES A DAY 05/13/16   Donita Brooks, MD  ibuprofen (ADVIL,MOTRIN) 200 MG tablet Take 800 mg by mouth every 6 (six) hours as needed for headache.    Historical Provider, MD    Family History Family History  Problem Relation Age of Onset  . Diabetes Mother   .  Hypertension Mother   . Hypertension Father     Social History Social History  Substance Use Topics  . Smoking status: Former Smoker    Packs/day: 0.04    Years: 1.50    Types: Cigarettes    Quit date: 11/21/2010  . Smokeless tobacco: Never Used  . Alcohol use No     Allergies   Coconut flavor and Pineapple   Review of Systems Review of Systems  Constitutional: Negative.  Negative for appetite change and fever.  HENT: Negative for congestion, ear pain and sore throat.   Eyes: Negative.   Respiratory: Negative for cough and shortness of breath.   Cardiovascular: Negative for chest pain (left upper chest pain).  Gastrointestinal: Negative for abdominal pain, nausea and vomiting.  Genitourinary: Negative for dysuria, frequency and hematuria.  Musculoskeletal: Negative for back pain and neck pain.  Skin: Negative for rash.  Neurological: Negative for dizziness and headaches.  Hematological: Does not bruise/bleed easily.  Psychiatric/Behavioral: The patient is not nervous/anxious.      Physical Exam Updated Vital Signs BP 131/69   Pulse 62   Temp 97.7 F (36.5 C)  Resp 18   Ht 5\' 7"  (1.702 m)   Wt 104.3 kg   LMP 06/24/2016   SpO2 99%   BMI 36.02 kg/m   Physical Exam  Constitutional: She is oriented to person, place, and time. She appears well-developed and well-nourished. No distress.  HENT:  Head: Normocephalic.  Mouth/Throat: Oropharynx is clear and moist.  Eyes: Pupils are equal, round, and reactive to light.  Neck: Normal range of motion. Neck supple. No Kernig's sign noted. No thyromegaly present.  Cardiovascular: Normal rate, regular rhythm and normal heart sounds.   No murmur heard. Pulmonary/Chest: Effort normal and breath sounds normal. She has no wheezes. She exhibits tenderness (focal ttp of the left upper chest.  no rash, edema or erythema.).  Abdominal: Soft. Normal appearance. There is no tenderness. There is no rebound and no guarding.    Musculoskeletal: Normal range of motion.  Neurological: She is alert and oriented to person, place, and time.  Skin: Skin is warm and dry. No rash noted.  Nursing note and vitals reviewed.    ED Treatments / Results  Labs (all labs ordered are listed, but only abnormal results are displayed) Labs Reviewed  BASIC METABOLIC PANEL - Abnormal; Notable for the following:       Result Value   Potassium 3.3 (*)    Calcium 8.3 (*)    All other components within normal limits  CBC WITH DIFFERENTIAL/PLATELET - Abnormal; Notable for the following:    HCT 34.6 (*)    MCV 77.6 (*)    All other components within normal limits  TROPONIN I    EKG  EKG Interpretation  Date/Time:  Wednesday July 15 2016 21:07:00 EST Ventricular Rate:  62 PR Interval:  156 QRS Duration: 100 QT Interval:  420 QTC Calculation: 426 R Axis:   30 Text Interpretation:  Normal sinus rhythm Normal ECG No significant change since last tracing Confirmed by [facility] MD, Barbara Cower [phone]) on 07/15/2016 9:14:17 PM       Radiology Dg Chest 2 View  Result Date: 07/15/2016 CLINICAL DATA:  LEFT chest pain radiating to shoulder and back beginning at 8 p.m. Pain with inspiration. EXAM: CHEST  2 VIEW COMPARISON:  Chest radiograph January 12, 2016 FINDINGS: Cardiomediastinal silhouette is normal. No pleural effusions or focal consolidations. Trachea projects midline and there is no pneumothorax. Soft tissue planes and included osseous structures are non-suspicious. Old mild mid thoracic compression fracture with Schmorl's node. Surgical clips in the included right abdomen compatible with cholecystectomy. IMPRESSION: No acute cardiopulmonary process. Electronically Signed   By: Awilda Metro M.D.   On: 07/15/2016 22:42     Procedures Procedures (including critical care time)  Medications Ordered in ED Medications - No data to display   Initial Impression / Assessment and Plan / ED Course  I have reviewed the triage vital  signs and the nursing notes.  Pertinent labs & imaging results that were available during my care of the patient were reviewed by me and considered in my medical decision making (see chart for details).     Pt with non specific chest pain.  XR;s labs and EKG are reassuring.  Doubt PE.  PERC neg.  Likely musculoskeletal .  Pt appears stable for d/c, agrees to tx plan with NSAID, PCP f/u if needed.  ER precautions given.   Final Clinical Impressions(s) / ED Diagnoses   Final diagnoses:  Chest pain, unspecified type    New Prescriptions Discharge Medication List as of 07/15/2016 11:25 PM  START taking these medications   Details  naproxen (NAPROSYN) 500 MG tablet Take 1 tablet (500 mg total) by mouth 2 (two) times daily with a meal., Starting Wed 07/15/2016, Print         Aizen Duval [place], PA-C 07/18/16 1721    Marily MemosJason Mesner, MD 07/19/16 [phone]

## 2016-07-15 NOTE — ED Triage Notes (Signed)
Pt c/o central/left chest pain that radiates to the back x 45 mins. Pain is worse with deep breath and on palpitation.

## 2016-07-15 NOTE — Discharge Instructions (Signed)
Follow-up with your doctor for recheck.  Return here for any worsening symptoms. You may apply ice or heat to your chest if needed

## 2016-08-02 ENCOUNTER — Encounter (HOSPITAL_COMMUNITY): Payer: Self-pay | Admitting: Emergency Medicine

## 2016-08-02 ENCOUNTER — Emergency Department (HOSPITAL_COMMUNITY)
Admission: EM | Admit: 2016-08-02 | Discharge: 2016-08-02 | Disposition: A | Discharge status: Home / Self Care | Payer: Self-pay | Attending: Dermatology | Admitting: Dermatology

## 2016-08-02 DIAGNOSIS — Z87891 Personal history of nicotine dependence: Secondary | ICD-10-CM | POA: Insufficient documentation

## 2016-08-02 DIAGNOSIS — R1010 Upper abdominal pain, unspecified: Secondary | ICD-10-CM | POA: Insufficient documentation

## 2016-08-02 DIAGNOSIS — Z5321 Procedure and treatment not carried out due to patient leaving prior to being seen by health care provider: Secondary | ICD-10-CM | POA: Insufficient documentation

## 2016-08-02 DIAGNOSIS — Z791 Long term (current) use of non-steroidal anti-inflammatories (NSAID): Secondary | ICD-10-CM | POA: Insufficient documentation

## 2016-08-02 NOTE — ED Notes (Signed)
No answer for patient when called to room

## 2016-08-02 NOTE — ED Triage Notes (Signed)
Pt with upper abdominal pain since 2 pm with vomiting and diarrhea. Pt also c/o chills.

## 2016-08-04 ENCOUNTER — Other Ambulatory Visit: Payer: Self-pay | Admitting: Family Medicine

## 2016-08-04 DIAGNOSIS — N760 Acute vaginitis: Secondary | ICD-10-CM

## 2016-08-06 ENCOUNTER — Emergency Department (HOSPITAL_COMMUNITY)
Admission: EM | Admit: 2016-08-06 | Discharge: 2016-08-06 | Disposition: A | Discharge status: Home / Self Care | Payer: Self-pay | Attending: Emergency Medicine | Admitting: Emergency Medicine

## 2016-08-06 ENCOUNTER — Encounter (HOSPITAL_COMMUNITY): Payer: Self-pay | Admitting: Emergency Medicine

## 2016-08-06 DIAGNOSIS — Z87891 Personal history of nicotine dependence: Secondary | ICD-10-CM | POA: Insufficient documentation

## 2016-08-06 DIAGNOSIS — Z791 Long term (current) use of non-steroidal anti-inflammatories (NSAID): Secondary | ICD-10-CM | POA: Insufficient documentation

## 2016-08-06 DIAGNOSIS — K529 Noninfective gastroenteritis and colitis, unspecified: Secondary | ICD-10-CM | POA: Insufficient documentation

## 2016-08-06 LAB — CBC
HCT: 35.8 % — ABNORMAL LOW (ref 36.0–46.0)
HEMOGLOBIN: 12.4 g/dL (ref 12.0–15.0)
MCH: 26.7 pg (ref 26.0–34.0)
MCHC: 34.6 g/dL (ref 30.0–36.0)
MCV: 77.2 fL — AB (ref 78.0–100.0)
PLATELETS: 236 10*3/uL (ref 150–400)
RBC: 4.64 MIL/uL (ref 3.87–5.11)
RDW: 13.4 % (ref 11.5–15.5)
WBC: 7 10*3/uL (ref 4.0–10.5)

## 2016-08-06 LAB — BASIC METABOLIC PANEL
ANION GAP: 7 (ref 5–15)
BUN: 10 mg/dL (ref 6–20)
CO2: 25 mmol/L (ref 22–32)
Calcium: 8.4 mg/dL — ABNORMAL LOW (ref 8.9–10.3)
Chloride: 105 mmol/L (ref 101–111)
Creatinine, Ser: 0.55 mg/dL (ref 0.44–1.00)
GFR calc Af Amer: >60 mL/min (ref >60)
Glucose, Bld: 87 mg/dL (ref 65–99)
POTASSIUM: 3.1 mmol/L — AB (ref 3.5–5.1)
SODIUM: 137 mmol/L (ref 135–145)

## 2016-08-06 MED ORDER — ONDANSETRON HCL 4 MG/2ML IJ SOLN
4.0000 mg | Freq: Once | INTRAMUSCULAR | Status: DC
  Filled 2016-08-06: qty 2

## 2016-08-06 MED ORDER — POTASSIUM CHLORIDE CRYS ER 20 MEQ PO TBCR
40.0000 meq | EXTENDED_RELEASE_TABLET | Freq: Once | ORAL | Status: AC
  Administered 2016-08-06: 40 meq via ORAL
  Filled 2016-08-06: qty 2

## 2016-08-06 MED ORDER — ONDANSETRON 4 MG PO TBDP
4.0000 mg | ORAL_TABLET | Freq: Once | ORAL | Status: AC
  Administered 2016-08-06: 4 mg via ORAL
  Filled 2016-08-06: qty 1

## 2016-08-06 MED ORDER — KETOROLAC TROMETHAMINE 30 MG/ML IJ SOLN
30.0000 mg | Freq: Once | INTRAMUSCULAR | Status: DC
  Filled 2016-08-06: qty 1

## 2016-08-06 MED ORDER — SODIUM CHLORIDE 0.9 % IV BOLUS (SEPSIS): 1000.0000 mL | Freq: Once | INTRAVENOUS | Status: DC

## 2016-08-06 MED ORDER — ONDANSETRON 4 MG PO TBDP: ORAL_TABLET | ORAL | 0 refills | Status: DC

## 2016-08-06 NOTE — Discharge Instructions (Signed)
Drink plenty of fluids. Take Tylenol or Motrin for pain. Follow-up with your doctor if not improving.

## 2016-08-06 NOTE — ED Notes (Signed)
Pt drinking ginger ale  

## 2016-08-06 NOTE — ED Provider Notes (Signed)
[facility]-EMERGENCY DEPT Provider Note   CSN: [id number] Arrival date & time: 08/06/16  1125   By signing my name below, I, Stacy [name], attest that this documentation has been prepared under the direction and in the presence of Bethann Berkshire, MD  Electronically Signed: Clovis Pu, ED Scribe. 08/06/16. 12:52 PM.   History   Chief Complaint Chief Complaint  Patient presents with  . Emesis    The history is provided by the patient. No language interpreter was used.  Emesis   The current episode started yesterday. The problem occurs 2 to 4 times per day. The problem has not changed since onset.The maximum temperature recorded prior to her arrival was 100 to 100.9 F. Associated symptoms include abdominal pain, chills, diarrhea and a fever. Pertinent negatives include no cough and no headaches. Risk factors include ill contacts.   HPI Comments:  Stacy [name] is a 31 y.o. female who presents to the Emergency Department complaining of nausea and vomiting onset 4 days. Pt also reports 4 episodes of vomiting today, diarrhea, chills, fever, upper left sided abdominal pain and recent sick contacts. No alleviating or modifying factors noted.  Pt denies sore throat, hematochezia, hematemesis or any other associated symptoms. No other complaints noted.   Past Medical History:  Diagnosis Date  . Carpal tunnel syndrome   . Migraine   . Obesity     Patient Active Problem List   Diagnosis Date Noted  . HSV-2 (herpes simplex virus 2) infection 08/07/2015  . Precordial pain 05/31/2013  . Obesity   . Carpal tunnel syndrome     Past Surgical History:  Procedure Laterality Date  . CESAREAN SECTION    . CESAREAN SECTION    . CHOLECYSTECTOMY    . MOUTH SURGERY    . TUBAL LIGATION    . WISDOM TOOTH EXTRACTION      OB History    Gravida Para Term Preterm AB Living   2 2 2     2    SAB TAB Ectopic Multiple Live Births                   Home Medications    Prior to Admission medications    Medication Sig Start Date End Date Taking? Authorizing Provider  acyclovir (ZOVIRAX) 400 MG tablet TAKE 1 TABLET BY MOUTH 3 TIMES A DAY Patient not taking: Reported on 07/15/2016 05/13/16   Donita Brooks, MD  ibuprofen (ADVIL,MOTRIN) 200 MG tablet Take 800 mg by mouth every 6 (six) hours as needed for headache.    Historical Provider, MD  metroNIDAZOLE (FLAGYL) 500 MG tablet TAKE 1 TABLET (500 MG TOTAL) BY MOUTH 3 (THREE) TIMES DAILY. 08/05/16   Donita Brooks, MD  naproxen (NAPROSYN) 500 MG tablet Take 1 tablet (500 mg total) by mouth 2 (two) times daily with a meal. 07/15/16   Tammy Triplett, PA-C    Family History Family History  Problem Relation Age of Onset  . Diabetes Mother   . Hypertension Mother   . Hypertension Father     Social History Social History  Substance Use Topics  . Smoking status: Former Smoker    Packs/day: 0.04    Years: 1.50    Types: Cigarettes    Quit date: 11/21/2010  . Smokeless tobacco: Never Used  . Alcohol use No     Allergies   Coconut flavor and Pineapple   Review of Systems Review of Systems  Constitutional: Positive for chills and fever. Negative for appetite change  and fatigue.  HENT: Negative for congestion, ear discharge, sinus pressure and sore throat.   Eyes: Negative for discharge.  Respiratory: Negative for cough.   Cardiovascular: Negative for chest pain.  Gastrointestinal: Positive for abdominal pain, diarrhea, nausea and vomiting. Negative for blood in stool.  Genitourinary: Negative for frequency and hematuria.  Musculoskeletal: Negative for back pain.  Skin: Negative for rash.  Neurological: Negative for seizures and headaches.  Psychiatric/Behavioral: Negative for hallucinations.   Physical Exam Updated Vital Signs BP 130/77 (BP Location: Left Arm)   Pulse 67   Temp 98.5 F (36.9 C) (Oral)   Resp 20   Ht 5\' 7"  (1.702 m)   Wt 230 lb (104.3 kg)   LMP 08/01/2016   SpO2 100%   BMI 36.02 kg/m   Physical Exam    Constitutional: She is oriented to person, place, and time. She appears well-developed.  HENT:  Head: Normocephalic.  Eyes: Conjunctivae and EOM are normal. No scleral icterus.  Neck: Neck supple. No thyromegaly present.  Cardiovascular: Normal rate and regular rhythm.  Exam reveals no gallop and no friction rub.   No murmur heard. Pulmonary/Chest: No stridor. She has no wheezes. She has no rales. She exhibits no tenderness.  Abdominal: She exhibits no distension. There is tenderness (minor) in the left upper quadrant. There is no rebound.  Musculoskeletal: Normal range of motion. She exhibits no edema.  Lymphadenopathy:    She has no cervical adenopathy.  Neurological: She is oriented to person, place, and time. She exhibits normal muscle tone. Coordination normal.  Skin: No rash noted. No erythema.  Psychiatric: She has a normal mood and affect. Her behavior is normal.     ED Treatments / Results  DIAGNOSTIC STUDIES:  Oxygen Saturation is 100% on RA, normal by my interpretation.    COORDINATION OF CARE:  12:49 PM Discussed treatment plan with pt at bedside and pt agreed to plan.  Labs (all labs ordered are listed, but only abnormal results are displayed) Labs Reviewed  CBC  BASIC METABOLIC PANEL    EKG  EKG Interpretation None       Radiology No results found.  Procedures Procedures (including critical care time)  Medications Ordered in ED Medications - No data to display   Initial Impression / Assessment and Plan / ED Course  I have reviewed the triage vital signs and the nursing notes.  Pertinent labs & imaging results that were available during my care of the patient were reviewed by me and considered in my medical decision making (see chart for details).     Patient with arthritis mild dehydration. She will be treated with Zofran and will follow-up as needed  Final Clinical Impressions(s) / ED Diagnoses   Final diagnoses:  None    New  Prescriptions New Prescriptions   No medications on file  The chart was scribed for me under my direct supervision.  I personally performed the history, physical, and medical decision making and all procedures in the evaluation of this patient.Bethann Berkshire.     Seaborn Nakama, MD 08/06/16 1501

## 2016-08-06 NOTE — ED Notes (Signed)
Three RN's attempted at an IV, pt not wanting any more sticks, 6 sticks, Lab obtained blood, MD aware. ODT zofran given

## 2016-08-06 NOTE — ED Triage Notes (Signed)
Started Sunday with vomiting and diarrhea.  C/o left upper abdomen pain ( 9/10)Tuesday night.

## 2016-08-22 ENCOUNTER — Encounter (HOSPITAL_COMMUNITY): Payer: Self-pay | Admitting: *Deleted

## 2016-08-22 ENCOUNTER — Emergency Department (HOSPITAL_COMMUNITY)
Admission: EM | Admit: 2016-08-22 | Discharge: 2016-08-22 | Disposition: A | Discharge status: Home / Self Care | Payer: Self-pay | Attending: Emergency Medicine | Admitting: Emergency Medicine

## 2016-08-22 ENCOUNTER — Emergency Department (HOSPITAL_COMMUNITY): Payer: Self-pay

## 2016-08-22 DIAGNOSIS — Y9389 Activity, other specified: Secondary | ICD-10-CM | POA: Insufficient documentation

## 2016-08-22 DIAGNOSIS — Y999 Unspecified external cause status: Secondary | ICD-10-CM | POA: Insufficient documentation

## 2016-08-22 DIAGNOSIS — Z791 Long term (current) use of non-steroidal anti-inflammatories (NSAID): Secondary | ICD-10-CM | POA: Insufficient documentation

## 2016-08-22 DIAGNOSIS — X501XXA Overexertion from prolonged static or awkward postures, initial encounter: Secondary | ICD-10-CM | POA: Insufficient documentation

## 2016-08-22 DIAGNOSIS — Z79899 Other long term (current) drug therapy: Secondary | ICD-10-CM | POA: Insufficient documentation

## 2016-08-22 DIAGNOSIS — Z87891 Personal history of nicotine dependence: Secondary | ICD-10-CM | POA: Insufficient documentation

## 2016-08-22 DIAGNOSIS — Y929 Unspecified place or not applicable: Secondary | ICD-10-CM | POA: Insufficient documentation

## 2016-08-22 DIAGNOSIS — S93402A Sprain of unspecified ligament of left ankle, initial encounter: Secondary | ICD-10-CM | POA: Insufficient documentation

## 2016-08-22 MED ORDER — NAPROXEN 500 MG PO TABS: 500.0000 mg | ORAL_TABLET | Freq: Two times a day (BID) | ORAL | 0 refills | Status: DC

## 2016-08-22 NOTE — ED Provider Notes (Signed)
[facility] Provider Note   CSN: [id number] Arrival date & time: 08/22/16  0845     History   Chief Complaint Chief Complaint  Patient presents with  . Foot Injury    HPI Stacy [name] is a 31 y.o. female.  HPI   Stacy [name] is a 31 y.o. female who presents to the Emergency Department complaining of left foot and ankle pain for one day.  She states the pain began after a twisting injury to her ankle after stepping off a step last evening, also reports a repeated injury to the ankle this morning when trying to bear weight.  She reports pain last night that has worsened this morning after her second injury.  Pain is associated with weight bearing and movement of the ankle and radiates to the side and top of her foot. She took ibuprofen with minimal relief.  She denies open wound, head, neck or back pain, numbness of her foot or pain proximal to the ankle.  Past Medical History:  Diagnosis Date  . Carpal tunnel syndrome   . Migraine   . Obesity     Patient Active Problem List   Diagnosis Date Noted  . HSV-2 (herpes simplex virus 2) infection 08/07/2015  . Precordial pain 05/31/2013  . Obesity   . Carpal tunnel syndrome     Past Surgical History:  Procedure Laterality Date  . CESAREAN SECTION    . CESAREAN SECTION    . CHOLECYSTECTOMY    . MOUTH SURGERY    . TUBAL LIGATION    . WISDOM TOOTH EXTRACTION      OB History    Gravida Para Term Preterm AB Living   2 2 2     2    SAB TAB Ectopic Multiple Live Births                   Home Medications    Prior to Admission medications   Medication Sig Start Date End Date Taking? Authorizing Provider  acyclovir (ZOVIRAX) 400 MG tablet TAKE 1 TABLET BY MOUTH 3 TIMES A DAY Patient not taking: Reported on 08/06/2016 05/13/16   Donita BrooksWarren T Pickard, MD  Bismuth Subsalicylate (PEPTO-BISMOL) 262 MG TABS Take 1 tablet by mouth 2 (two) times daily.    Historical Provider, MD  ibuprofen (ADVIL,MOTRIN) 200 MG tablet Take 800  mg by mouth every 6 (six) hours as needed for headache.    Historical Provider, MD  metroNIDAZOLE (FLAGYL) 500 MG tablet TAKE 1 TABLET (500 MG TOTAL) BY MOUTH 3 (THREE) TIMES DAILY. 08/05/16   Donita BrooksWarren T Pickard, MD  naproxen (NAPROSYN) 500 MG tablet Take 1 tablet (500 mg total) by mouth 2 (two) times daily with a meal. Patient not taking: Reported on 08/06/2016 07/15/16   Quintan Saldivar, PA-C  ondansetron (ZOFRAN ODT) 4 MG disintegrating tablet 4mg  ODT q4 hours prn nausea/vomit 08/06/16   Bethann BerkshireJoseph Zammit, MD    Family History Family History  Problem Relation Age of Onset  . Diabetes Mother   . Hypertension Mother   . Hypertension Father     Social History Social History  Substance Use Topics  . Smoking status: Former Smoker    Packs/day: 0.04    Years: 1.50    Types: Cigarettes    Quit date: 11/21/2010  . Smokeless tobacco: Never Used  . Alcohol use No     Allergies   Coconut flavor and Pineapple   Review of Systems Review of Systems  Constitutional: Negative for chills  and fever.  Genitourinary: Negative for difficulty urinating and dysuria.  Musculoskeletal: Positive for arthralgias and joint swelling.  Skin: Negative for color change and wound.  All other systems reviewed and are negative.    Physical Exam Updated Vital Signs BP 132/64 (BP Location: Right Arm)   Pulse 70   Temp 98.1 F (36.7 C) (Temporal)   Resp 18   Ht 5\' 7"  (1.702 m)   Wt 104.3 kg   LMP 08/01/2016   SpO2 99%   BMI 36.02 kg/m   Physical Exam  Constitutional: She is oriented to person, place, and time. She appears well-developed and well-nourished. No distress.  HENT:  Head: Normocephalic and atraumatic.  Cardiovascular: Normal rate, regular rhythm and intact distal pulses.   Pulmonary/Chest: Effort normal and breath sounds normal.  Musculoskeletal: She exhibits edema and tenderness. She exhibits no deformity.  Diffuse ttp of the lateral left foot and lateral ankle, mild edema.  DP pulse is  brisk,distal sensation intact.  No erythema, abrasion, bruising or bony deformity.  No proximal tenderness.  Neurological: She is alert and oriented to person, place, and time. She exhibits normal muscle tone. Coordination normal.  Skin: Skin is warm and dry.  Nursing note and vitals reviewed.    ED Treatments / Results  Labs (all labs ordered are listed, but only abnormal results are displayed) Labs Reviewed - No data to display  EKG  EKG Interpretation None       Radiology Dg Ankle Complete Left  Result Date: 08/22/2016 CLINICAL DATA:  Left ankle pain after injury yesterday. EXAM: LEFT ANKLE COMPLETE - 3+ VIEW COMPARISON:  None. FINDINGS: There is no evidence of fracture, dislocation, or joint effusion. There is no evidence of arthropathy or other focal bone abnormality. Soft tissues are unremarkable. IMPRESSION: Normal left ankle. Electronically Signed   By: Lupita Raider, M.D.   On: 08/22/2016 09:38   Dg Foot Complete Left  Result Date: 08/22/2016 CLINICAL DATA:  Left foot pain after injury yesterday. EXAM: LEFT FOOT - COMPLETE 3+ VIEW COMPARISON:  None. FINDINGS: There is no evidence of fracture or dislocation. There is no evidence of arthropathy or other focal bone abnormality. Soft tissues are unremarkable. IMPRESSION: Normal left foot. Electronically Signed   By: Lupita Raider, M.D.   On: 08/22/2016 09:37    Procedures Procedures (including critical care time)  Medications Ordered in ED Medications - No data to display   Initial Impression / Assessment and Plan / ED Course  I have reviewed the triage vital signs and the nursing notes.  Pertinent labs & imaging results that were available during my care of the patient were reviewed by me and considered in my medical decision making (see chart for details).     Pt is well appearing,  NV intact.  Ambulates with a slight limp.  XRs are negative for fx.    Plan includes ASO, crutches.  She agrees to RICE therapy and  orthopedic f/u in one week if not improving. Rx for naprosyn  Final Clinical Impressions(s) / ED Diagnoses   Final diagnoses:  Sprain of left ankle, unspecified ligament, initial encounter    New Prescriptions New Prescriptions   No medications on file     Pauline Aus, PA-C 08/22/16 1040    Blane Ohara, MD 08/22/16 [phone]

## 2016-08-22 NOTE — Discharge Instructions (Signed)
Elevate and apply ice packs on/off to your foot and ankle.  Crutches for weight bearing for one week.  Call the orthopedic doctor listed to arrange follow-up appt in one week if not improving

## 2016-08-22 NOTE — ED Triage Notes (Addendum)
Pt was helping a friend move yesterday when she stepped off of the bottom step wrong, hurting her left foot. She states she went to bed and got up this morning and twisted her foot on her own steps. NAD noted. Pt is ambulatory.

## 2016-10-25 ENCOUNTER — Encounter (HOSPITAL_COMMUNITY): Payer: Self-pay

## 2016-10-25 ENCOUNTER — Emergency Department (HOSPITAL_COMMUNITY)
Admission: EM | Admit: 2016-10-25 | Discharge: 2016-10-25 | Disposition: A | Discharge status: Home / Self Care | Payer: Self-pay | Attending: Emergency Medicine | Admitting: Emergency Medicine

## 2016-10-25 DIAGNOSIS — Z87891 Personal history of nicotine dependence: Secondary | ICD-10-CM | POA: Insufficient documentation

## 2016-10-25 DIAGNOSIS — N3090 Cystitis, unspecified without hematuria: Secondary | ICD-10-CM | POA: Insufficient documentation

## 2016-10-25 DIAGNOSIS — Z79899 Other long term (current) drug therapy: Secondary | ICD-10-CM | POA: Insufficient documentation

## 2016-10-25 LAB — URINALYSIS, ROUTINE W REFLEX MICROSCOPIC
BILIRUBIN URINE: NEGATIVE
Glucose, UA: NEGATIVE mg/dL
Hgb urine dipstick: NEGATIVE
KETONES UR: NEGATIVE mg/dL
Leukocytes, UA: NEGATIVE
Nitrite: POSITIVE — AB
PH: 6 (ref 5.0–8.0)
Protein, ur: 100 mg/dL — AB
SPECIFIC GRAVITY, URINE: 1.026 (ref 1.005–1.030)

## 2016-10-25 MED ORDER — CEPHALEXIN 500 MG PO CAPS: 500.0000 mg | ORAL_CAPSULE | Freq: Four times a day (QID) | ORAL | 0 refills | Status: DC

## 2016-10-25 MED ORDER — FLUCONAZOLE 150 MG PO TABS: ORAL_TABLET | ORAL | 0 refills | Status: DC

## 2016-10-25 NOTE — Discharge Instructions (Signed)
Get plenty of rest, and drink a lot of fluids.  Use Tylenol if needed for pain or fever.  Follow-up with your doctor as needed for problems.

## 2016-10-25 NOTE — ED Provider Notes (Signed)
[facility]-EMERGENCY DEPT Provider Note   CSN: [id number] Arrival date & time: 10/25/16  1206   By signing my name below, I, Soijett Blue, attest that this documentation has been prepared under the direction and in the presence of Mancel BaleWentz, Waller Marcussen, MD. Electronically Signed: Soijett Blue, ED Scribe. 10/25/16. 12:59 PM.  History   Chief Complaint Chief Complaint  Patient presents with  . Dysuria    HPI Stacy [name] is a 31 y.o. female who presents to the Emergency Department complaining of dysuria onset yesterday. Pt reports associated urinary frequency, pressure sensation following urination, pelvic pain, back pain, left flank pain, and generalized weakness. Pt has tried AZO tablets with no relief of her symptoms. She notes that she used lavender scented toilet paper yesterday prior to the onset ofher symptoms. Pt reports that she has a hx of recurrent UTI's while pregnant with her last child. She denies fever, chills, dizziness, abdominal pain, vaginal bleeding, vaginal discharge, and any other symptoms.     The history is provided by the patient. No language interpreter was used.    Past Medical History:  Diagnosis Date  . Carpal tunnel syndrome   . Migraine   . Obesity     Patient Active Problem List   Diagnosis Date Noted  . HSV-2 (herpes simplex virus 2) infection 08/07/2015  . Precordial pain 05/31/2013  . Obesity   . Carpal tunnel syndrome     Past Surgical History:  Procedure Laterality Date  . CESAREAN SECTION    . CESAREAN SECTION    . CHOLECYSTECTOMY    . MOUTH SURGERY    . TUBAL LIGATION    . WISDOM TOOTH EXTRACTION      OB History    Gravida Para Term Preterm AB Living   2 2 2     2    SAB TAB Ectopic Multiple Live Births                   Home Medications    Prior to Admission medications   Medication Sig Start Date End Date Taking? Authorizing Provider  acyclovir (ZOVIRAX) 400 MG tablet TAKE 1 TABLET BY MOUTH 3 TIMES A DAY Patient not taking:  Reported on 08/06/2016 05/13/16   Donita BrooksPickard, Warren T, MD  Bismuth Subsalicylate (PEPTO-BISMOL) 262 MG TABS Take 1 tablet by mouth 2 (two) times daily.    [provider]  cephALEXin (KEFLEX) 500 MG capsule Take 1 capsule (500 mg total) by mouth 4 (four) times daily. 10/25/16   Mancel BaleWentz, Izaah Westman, MD  fluconazole (DIFLUCAN) 150 MG tablet Take after you complete the antibiotic prescription, to treat a yeast infection 10/25/16   Mancel BaleWentz, Elfrida Pixley, MD  ibuprofen (ADVIL,MOTRIN) 200 MG tablet Take 800 mg by mouth every 6 (six) hours as needed for headache.    [provider]  metroNIDAZOLE (FLAGYL) 500 MG tablet TAKE 1 TABLET (500 MG TOTAL) BY MOUTH 3 (THREE) TIMES DAILY. 08/05/16   Donita BrooksPickard, Warren T, MD  naproxen (NAPROSYN) 500 MG tablet Take 1 tablet (500 mg total) by mouth 2 (two) times daily with a meal. 08/22/16   Triplett, Tammy, PA-C  ondansetron (ZOFRAN ODT) 4 MG disintegrating tablet 4mg  ODT q4 hours prn nausea/vomit 08/06/16   Bethann BerkshireZammit, Joseph, MD    Family History Family History  Problem Relation Age of Onset  . Diabetes Mother   . Hypertension Mother   . Hypertension Father     Social History Social History  Substance Use Topics  . Smoking status: Former Smoker  Packs/day: 0.04    Years: 1.50    Types: Cigarettes    Quit date: 11/21/2010  . Smokeless tobacco: Never Used  . Alcohol use No     Allergies   Coconut flavor and Pineapple   Review of Systems Review of Systems  Constitutional: Negative for chills and fever.  Gastrointestinal: Negative for abdominal pain.  Genitourinary: Positive for dysuria, flank pain (left), frequency and pelvic pain. Negative for vaginal bleeding and vaginal discharge.       +Pressure sensation following urination  Musculoskeletal: Positive for back pain.  Neurological: Positive for weakness (generalized). Negative for dizziness.  All other systems reviewed and are negative.    Physical Exam Updated Vital Signs BP 117/82   Pulse 70    Temp 97.7 F (36.5 C) (Oral)   Resp 16   Ht 5\' 7"  (1.702 m)   Wt 230 lb (104.3 kg)   LMP 09/25/2016   SpO2 95%   BMI 36.02 kg/m   Physical Exam  Constitutional: She is oriented to person, place, and time. She appears well-developed and well-nourished.  HENT:  Head: Normocephalic and atraumatic.  Eyes: EOM are normal. Pupils are equal, round, and reactive to light.  Neck: Normal range of motion and phonation normal. Neck supple.  Cardiovascular: Normal rate, regular rhythm and normal heart sounds.  Exam reveals no gallop and no friction rub.   No murmur heard. Pulmonary/Chest: Effort normal and breath sounds normal. No respiratory distress. She has no wheezes. She has no rales. She exhibits no tenderness.  Abdominal: Soft. She exhibits no distension. There is tenderness in the suprapubic area. There is CVA tenderness. There is no guarding.  Mild left CVA tenderness with percussion. Mild suprapubic tenderness.   Musculoskeletal: Normal range of motion.  Neurological: She is alert and oriented to person, place, and time. She exhibits normal muscle tone.  Skin: Skin is warm and dry.  Psychiatric: She has a normal mood and affect. Her behavior is normal. Judgment and thought content normal.  Nursing note and vitals reviewed.    ED Treatments / Results  DIAGNOSTIC STUDIES: Oxygen Saturation is 95% on RA, adequate by my interpretation.    COORDINATION OF CARE: 12:46 PM Discussed treatment plan with pt at bedside which includes UA and pt agreed to plan.   Labs (all labs ordered are listed, but only abnormal results are displayed) Results for orders placed or performed during the hospital encounter of 10/25/16  Urinalysis, Routine w reflex microscopic- may I&O cath if menses  Result Value Ref Range   Color, Urine AMBER (A) YELLOW   APPearance HAZY (A) CLEAR   Specific Gravity, Urine 1.026 1.005 - 1.030   pH 6.0 5.0 - 8.0   Glucose, UA NEGATIVE NEGATIVE mg/dL   Hgb urine  dipstick NEGATIVE NEGATIVE   Bilirubin Urine NEGATIVE NEGATIVE   Ketones, ur NEGATIVE NEGATIVE mg/dL   Protein, ur 161 (A) NEGATIVE mg/dL   Nitrite POSITIVE (A) NEGATIVE   Leukocytes, UA NEGATIVE NEGATIVE   RBC / HPF 6-30 0 - 5 RBC/hpf   WBC, UA 6-30 0 - 5 WBC/hpf   Bacteria, UA RARE (A) NONE SEEN   Squamous Epithelial / LPF 0-5 (A) NONE SEEN   WBC Clumps PRESENT    Mucous PRESENT    Hyaline Casts, UA PRESENT     Procedures Procedures (including critical care time)  Medications Ordered in ED Medications - No data to display   Initial Impression / Assessment and Plan / ED Course  I  have reviewed the triage vital signs and the nursing notes.  Pertinent labs that were available during my care of the patient were reviewed by me and considered in my medical decision making (see chart for details).     Medications - No data to display  Patient Vitals for the past 24 hrs:  BP Temp Temp src Pulse Resp SpO2 Height Weight  10/25/16 1214 117/82 97.7 F (36.5 C) Oral 70 16 95 % 5\' 7"  (1.702 m) 230 lb (104.3 kg)    1:54 PM Reevaluation with update and discussion. After initial assessment and treatment, an updated evaluation reveals she remains comfortable.  She would like a prescription for an anticipated yeast infection secondary to antibiotic usage, she has no further complaints.  Findings discussed with the patient and all questions answered. Taura Lamarre L    Final Clinical Impressions(s) / ED Diagnoses   Final diagnoses:  Cystitis    Evaluation consistent with cystitis.  Doubt serious bacterial infection metabolic instability or impending vascular collapse.  Nursing Notes Reviewed/ Care Coordinated Applicable Imaging Reviewed Interpretation of Laboratory Data incorporated into ED treatment  The patient appears reasonably screened and/or stabilized for discharge and I doubt any other medical condition or other [facility] requiring further screening, evaluation, or treatment in  the ED at this time prior to discharge.  Plan: Home Medications-OTC urinary analgesia, as needed, APAP for pain or fever; Home Treatments-rest, fluids; return here if the recommended treatment, does not improve the symptoms; Recommended follow up-PCP, as needed  New Prescriptions New Prescriptions   CEPHALEXIN (KEFLEX) 500 MG CAPSULE    Take 1 capsule (500 mg total) by mouth 4 (four) times daily.   FLUCONAZOLE (DIFLUCAN) 150 MG TABLET    Take after you complete the antibiotic prescription, to treat a yeast infection   I personally performed the services described in this documentation, which was scribed in my presence. The recorded information has been reviewed and is accurate.     Mancel Bale, MD 10/25/16 [phone]

## 2016-10-25 NOTE — ED Triage Notes (Signed)
Onset yesterday pt as at a house that had lavender toilet, pt gets UTI symptoms when using any kind of scented products.  Pt having dysuria, pressure, frequency   No blood noted.

## 2016-11-25 ENCOUNTER — Encounter (HOSPITAL_COMMUNITY): Payer: Self-pay | Admitting: *Deleted

## 2016-11-25 ENCOUNTER — Emergency Department (HOSPITAL_COMMUNITY): Payer: Self-pay

## 2016-11-25 ENCOUNTER — Emergency Department (HOSPITAL_COMMUNITY)
Admission: EM | Admit: 2016-11-25 | Discharge: 2016-11-25 | Disposition: A | Discharge status: Home / Self Care | Payer: Self-pay | Attending: Emergency Medicine | Admitting: Emergency Medicine

## 2016-11-25 DIAGNOSIS — Z79899 Other long term (current) drug therapy: Secondary | ICD-10-CM | POA: Insufficient documentation

## 2016-11-25 DIAGNOSIS — Z87891 Personal history of nicotine dependence: Secondary | ICD-10-CM | POA: Insufficient documentation

## 2016-11-25 DIAGNOSIS — M549 Dorsalgia, unspecified: Secondary | ICD-10-CM

## 2016-11-25 DIAGNOSIS — M62830 Muscle spasm of back: Secondary | ICD-10-CM | POA: Insufficient documentation

## 2016-11-25 LAB — I-STAT CHEM 8, ED
BUN: 9 mg/dL (ref 6–20)
CALCIUM ION: 1.18 mmol/L (ref 1.15–1.40)
Chloride: 106 mmol/L (ref 101–111)
Creatinine, Ser: 0.6 mg/dL (ref 0.44–1.00)
Glucose, Bld: 97 mg/dL (ref 65–99)
HEMATOCRIT: 36 % (ref 36.0–46.0)
HEMOGLOBIN: 12.2 g/dL (ref 12.0–15.0)
Potassium: 3.7 mmol/L (ref 3.5–5.1)
SODIUM: 142 mmol/L (ref 135–145)
TCO2: 24 mmol/L (ref 0–100)

## 2016-11-25 LAB — D-DIMER, QUANTITATIVE: D-Dimer, Quant: 0.42 ug/mL-FEU (ref 0.00–0.50)

## 2016-11-25 MED ORDER — METHOCARBAMOL 500 MG PO TABS: 500.0000 mg | ORAL_TABLET | Freq: Two times a day (BID) | ORAL | 0 refills | Status: DC

## 2016-11-25 MED ORDER — IBUPROFEN 800 MG PO TABS: 800.0000 mg | ORAL_TABLET | Freq: Three times a day (TID) | ORAL | 0 refills | Status: DC

## 2016-11-25 MED ORDER — KETOROLAC TROMETHAMINE 30 MG/ML IJ SOLN
30.0000 mg | Freq: Once | INTRAMUSCULAR | Status: AC
  Administered 2016-11-25: 30 mg via INTRAMUSCULAR
  Filled 2016-11-25: qty 1

## 2016-11-25 MED ORDER — DIAZEPAM 5 MG PO TABS
5.0000 mg | ORAL_TABLET | Freq: Once | ORAL | Status: AC
Start: 2016-11-25 — End: 2016-11-25
  Administered 2016-11-25: 5 mg via ORAL
  Filled 2016-11-25: qty 1
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
## 2016-11-25 NOTE — ED Provider Notes (Signed)
[facility] Provider Note   CSN: [id number] Arrival date & time: 11/25/16  0440     History   Chief Complaint Chief Complaint  Patient presents with  . Back Pain    HPI Stacy [name] is a 31 y.o. female.  Patient presents with upper back pain that started about 11:30 last night. States she's not been able to sleep. She had some soreness in her upper back yesterday but became worse when she tried to lie down and go to sleep. She took ibuprofen without relief. Denies any falls or injuries but she did clean the house yesterday but denies doing anything unusual. The pain is worse with movement of her bilateral arms. She denies any focal weakness, numbness or tingling. She denies any chest pain. She denies any abdominal pain, nausea or vomiting. She denies any previous history of back problems.   The history is provided by the patient.  Back Pain   Pertinent negatives include no fever, no headaches, no abdominal pain, no dysuria and no weakness.    Past Medical History:  Diagnosis Date  . Carpal tunnel syndrome   . Migraine   . Obesity     Patient Active Problem List   Diagnosis Date Noted  . HSV-2 (herpes simplex virus 2) infection 08/07/2015  . Precordial pain 05/31/2013  . Obesity   . Carpal tunnel syndrome     Past Surgical History:  Procedure Laterality Date  . CESAREAN SECTION    . CESAREAN SECTION    . CHOLECYSTECTOMY    . MOUTH SURGERY    . TUBAL LIGATION    . WISDOM TOOTH EXTRACTION      OB History    Gravida Para Term Preterm AB Living   2 2 2     2    SAB TAB Ectopic Multiple Live Births                   Home Medications    Prior to Admission medications   Medication Sig Start Date End Date Taking? Authorizing Provider  acyclovir (ZOVIRAX) 400 MG tablet TAKE 1 TABLET BY MOUTH 3 TIMES A DAY Patient not taking: Reported on 08/06/2016 05/13/16   Donita Brooks, MD  Bismuth Subsalicylate (PEPTO-BISMOL) 262 MG TABS Take 1 tablet by mouth 2  (two) times daily.    [provider]  cephALEXin (KEFLEX) 500 MG capsule Take 1 capsule (500 mg total) by mouth 4 (four) times daily. 10/25/16   Mancel Bale, MD  fluconazole (DIFLUCAN) 150 MG tablet Take after you complete the antibiotic prescription, to treat a yeast infection 10/25/16   Mancel Bale, MD  ibuprofen (ADVIL,MOTRIN) 200 MG tablet Take 800 mg by mouth every 6 (six) hours as needed for headache.    [provider]  metroNIDAZOLE (FLAGYL) 500 MG tablet TAKE 1 TABLET (500 MG TOTAL) BY MOUTH 3 (THREE) TIMES DAILY. 08/05/16   Donita Brooks, MD  naproxen (NAPROSYN) 500 MG tablet Take 1 tablet (500 mg total) by mouth 2 (two) times daily with a meal. 08/22/16   Triplett, Tammy, PA-C  ondansetron (ZOFRAN ODT) 4 MG disintegrating tablet 4mg  ODT q4 hours prn nausea/vomit 08/06/16   Bethann Berkshire, MD    Family History Family History  Problem Relation Age of Onset  . Diabetes Mother   . Hypertension Mother   . Hypertension Father     Social History Social History  Substance Use Topics  . Smoking status: Former Smoker    Packs/day: 0.04  Years: 1.50    Types: Cigarettes    Quit date: 11/21/2010  . Smokeless tobacco: Never Used  . Alcohol use No     Allergies   Coconut flavor and Pineapple   Review of Systems Review of Systems  Constitutional: Negative for activity change, appetite change and fever.  HENT: Negative for congestion and sinus pain.   Respiratory: Negative for chest tightness and shortness of breath.   Gastrointestinal: Negative for abdominal pain, nausea and vomiting.  Genitourinary: Negative for dysuria, hematuria, vaginal bleeding and vaginal discharge.  Musculoskeletal: Positive for arthralgias, back pain and myalgias.  Skin: Negative for wound.  Neurological: Negative for dizziness, weakness, light-headedness and headaches.    all other systems are negative except as noted in the HPI and PMH.    Physical Exam Updated Vital  Signs BP 126/81 (BP Location: Left Arm)   Pulse (!) 57   Temp 97.9 F (36.6 C) (Oral)   Resp 20   Ht 5\' 7"  (1.702 m)   Wt 108.9 kg (240 lb)   LMP 11/24/2016   SpO2 97%   BMI 37.59 kg/m   Physical Exam  Constitutional: She is oriented to person, place, and time. She appears well-developed and well-nourished. No distress.  HENT:  Head: Normocephalic and atraumatic.  Mouth/Throat: Oropharynx is clear and moist. No oropharyngeal exudate.  Eyes: Conjunctivae and EOM are normal. Pupils are equal, round, and reactive to light.  Neck: Normal range of motion. Neck supple.  No meningismus.  Cardiovascular: Normal rate, regular rhythm, normal heart sounds and intact distal pulses.   No murmur heard. Pulmonary/Chest: Effort normal and breath sounds normal. No respiratory distress.  Abdominal: Soft. There is no tenderness. There is no rebound and no guarding.  Musculoskeletal: Normal range of motion. She exhibits tenderness. She exhibits no edema.  Thoracic paraspinal tenderness with spasm.  TTP to bilateral trapezius and rhomboid with spasm. Worse with movement of arms. Intact radial pulses and grip strengths.  Neurological: She is alert and oriented to person, place, and time. No cranial nerve deficit. She exhibits normal muscle tone. Coordination normal.   5/5 strength throughout. CN 2-12 intact.Equal grip strength.   Skin: Skin is warm.  Psychiatric: She has a normal mood and affect. Her behavior is normal.  Nursing note and vitals reviewed.    ED Treatments / Results  Labs (all labs ordered are listed, but only abnormal results are displayed) Labs Reviewed  D-DIMER, QUANTITATIVE (NOT AT [facility])  I-STAT CHEM 8, ED    EKG  EKG Interpretation None       Radiology Dg Chest 2 View  Result Date: 11/25/2016 CLINICAL DATA:  Upper back pain between the shoulder blades. Stabbing sensation. EXAM: CHEST  2 VIEW COMPARISON:  Chest x-ray of July 15, 2016 FINDINGS: The lungs are  adequately inflated. There is no focal infiltrate. There is no pleural effusion or pneumothorax. The heart and pulmonary vascularity are normal. The mediastinum is normal in width. There is old deformity of the anterior aspect of the left fifth rib. IMPRESSION: There is no acute cardiopulmonary abnormality. Electronically Signed   By: David  [place] M.D.   On: 11/25/2016 07:30    Procedures Procedures (including critical care time)  Medications Ordered in ED Medications  diazepam (VALIUM) tablet 5 mg (not administered)  ketorolac (TORADOL) 30 MG/ML injection 30 mg (not administered)     Initial Impression / Assessment and Plan / ED Course  I have reviewed the triage vital signs and the nursing notes.  Pertinent labs & imaging results that were available during my care of the patient were reviewed by me and considered in my medical decision making (see chart for details).     Upper back pain without direct injury but did have physical activity yesterday. No focal neurological deficits. No chest pain. Equal upper extremity radial pulses and grip strengths.  Pain worse with palpation and arm movement. No chest pain.  D-dimer negative. CXR negative.  Equal upper extremity blood pressures and grip strengths. Doubt aortic dissection, doubt pulmonary embolism. Doubt ACS.  Will treat for upper back muscular pain with spasm. Follow up with PCP. Return precautions discussed.  Final Clinical Impressions(s) / ED Diagnoses   Final diagnoses:  Upper back pain    New Prescriptions New Prescriptions   No medications on file     Glynn Octaveancour, Osby Sweetin, MD 11/25/16 0900

## 2016-11-25 NOTE — ED Triage Notes (Signed)
Pt c/o upper back pain between her shoulder blades that started 30 mins pta; pt describes the pain as a stabbing pain

## 2016-11-25 NOTE — Discharge Instructions (Signed)
Take the antiinflammatories and muscle relaxers as prescribed. Follow up with your doctor. Return to the ED if you develop new or worsening symptoms.  °

## 2016-12-12 ENCOUNTER — Emergency Department (HOSPITAL_COMMUNITY)
Admission: EM | Admit: 2016-12-12 | Discharge: 2016-12-12 | Disposition: A | Discharge status: Home / Self Care | Payer: Self-pay | Attending: Emergency Medicine | Admitting: Emergency Medicine

## 2016-12-12 ENCOUNTER — Emergency Department (HOSPITAL_COMMUNITY): Payer: Self-pay

## 2016-12-12 ENCOUNTER — Encounter (HOSPITAL_COMMUNITY): Payer: Self-pay | Admitting: *Deleted

## 2016-12-12 DIAGNOSIS — Y92002 Bathroom of unspecified non-institutional (private) residence single-family (private) house as the place of occurrence of the external cause: Secondary | ICD-10-CM | POA: Insufficient documentation

## 2016-12-12 DIAGNOSIS — Y999 Unspecified external cause status: Secondary | ICD-10-CM | POA: Insufficient documentation

## 2016-12-12 DIAGNOSIS — W19XXXA Unspecified fall, initial encounter: Secondary | ICD-10-CM | POA: Insufficient documentation

## 2016-12-12 DIAGNOSIS — S8001XA Contusion of right knee, initial encounter: Secondary | ICD-10-CM | POA: Insufficient documentation

## 2016-12-12 DIAGNOSIS — Y939 Activity, unspecified: Secondary | ICD-10-CM | POA: Insufficient documentation

## 2016-12-12 DIAGNOSIS — Z87891 Personal history of nicotine dependence: Secondary | ICD-10-CM | POA: Insufficient documentation

## 2016-12-12 MED ORDER — MELOXICAM 15 MG PO TABS: 15.0000 mg | ORAL_TABLET | Freq: Every day | ORAL | 0 refills | Status: DC

## 2016-12-12 NOTE — ED Provider Notes (Signed)
[facility] Provider Note   CSN: [id number] Arrival date & time: 12/12/16  2224     History   Chief Complaint Chief Complaint  Patient presents with  . Knee Injury    HPI Stacy [name] is a 31 y.o. female.  Patient presents to the ER for evaluation of right knee pain. Patient reports that she stepped out of the shower and the floor collapsed, causing her right leg fell through the floor and injury as it crashed through the flooring. Patient has small abrasion on her right knee with pain diffusely around the knee. No deformity. She did not hit her head or lose consciousness. No neck or back pain.      Past Medical History:  Diagnosis Date  . Carpal tunnel syndrome   . Migraine   . Obesity     Patient Active Problem List   Diagnosis Date Noted  . HSV-2 (herpes simplex virus 2) infection 08/07/2015  . Precordial pain 05/31/2013  . Obesity   . Carpal tunnel syndrome     Past Surgical History:  Procedure Laterality Date  . CESAREAN SECTION    . CESAREAN SECTION    . CHOLECYSTECTOMY    . MOUTH SURGERY    . TUBAL LIGATION    . WISDOM TOOTH EXTRACTION      OB History    Gravida Para Term Preterm AB Living   2 2 2     2    SAB TAB Ectopic Multiple Live Births                   Home Medications    Prior to Admission medications   Medication Sig Start Date End Date Taking? Authorizing Provider  acyclovir (ZOVIRAX) 400 MG tablet TAKE 1 TABLET BY MOUTH 3 TIMES A DAY Patient not taking: Reported on 08/06/2016 05/13/16   Donita Brooks, MD  Bismuth Subsalicylate (PEPTO-BISMOL) 262 MG TABS Take 1 tablet by mouth 2 (two) times daily.    [provider]  cephALEXin (KEFLEX) 500 MG capsule Take 1 capsule (500 mg total) by mouth 4 (four) times daily. 10/25/16   Mancel Bale, MD  fluconazole (DIFLUCAN) 150 MG tablet Take after you complete the antibiotic prescription, to treat a yeast infection 10/25/16   Mancel Bale, MD  ibuprofen (ADVIL,MOTRIN) 800  MG tablet Take 1 tablet (800 mg total) by mouth 3 (three) times daily. 11/25/16   Rancour, Jeannett Senior, MD  meloxicam (MOBIC) 15 MG tablet Take 1 tablet (15 mg total) by mouth daily. 12/12/16   Gilda Crease, MD  methocarbamol (ROBAXIN) 500 MG tablet Take 1 tablet (500 mg total) by mouth 2 (two) times daily. 11/25/16   Rancour, Jeannett Senior, MD  metroNIDAZOLE (FLAGYL) 500 MG tablet TAKE 1 TABLET (500 MG TOTAL) BY MOUTH 3 (THREE) TIMES DAILY. 08/05/16   Donita Brooks, MD  naproxen (NAPROSYN) 500 MG tablet Take 1 tablet (500 mg total) by mouth 2 (two) times daily with a meal. 08/22/16   Triplett, Tammy, PA-C  ondansetron (ZOFRAN ODT) 4 MG disintegrating tablet 4mg  ODT q4 hours prn nausea/vomit 08/06/16   Bethann Berkshire, MD    Family History Family History  Problem Relation Age of Onset  . Diabetes Mother   . Hypertension Mother   . Hypertension Father     Social History Social History  Substance Use Topics  . Smoking status: Former Smoker    Packs/day: 0.04    Years: 1.50    Types: Cigarettes    Quit date:  11/21/2010  . Smokeless tobacco: Never Used  . Alcohol use No     Allergies   Coconut flavor and Pineapple   Review of Systems Review of Systems  Musculoskeletal: Positive for arthralgias.  All other systems reviewed and are negative.    Physical Exam Updated Vital Signs BP 136/65   Pulse 65   Temp 97.8 F (36.6 C) (Oral)   Resp 18   Ht 5\' 7"  (1.702 m)   Wt 108.9 kg (240 lb)   LMP 11/24/2016   SpO2 97%   BMI 37.59 kg/m   Physical Exam  Constitutional: She is oriented to person, place, and time. She appears well-developed and well-nourished. No distress.  HENT:  Head: Normocephalic and atraumatic.  Right Ear: Hearing normal.  Left Ear: Hearing normal.  Nose: Nose normal.  Mouth/Throat: Oropharynx is clear and moist and mucous membranes are normal.  Eyes: Conjunctivae and EOM are normal. Pupils are equal, round, and reactive to light.  Neck: Normal range of  motion. Neck supple.  Cardiovascular: Regular rhythm, S1 normal and S2 normal.  Exam reveals no gallop and no friction rub.   No murmur heard. Pulmonary/Chest: Effort normal and breath sounds normal. No respiratory distress. She exhibits no tenderness.  Abdominal: Soft. Normal appearance and bowel sounds are normal. There is no hepatosplenomegaly. There is no tenderness. There is no rebound, no guarding, no tenderness at McBurney's point and negative Murphy's sign. No hernia.  Musculoskeletal: Normal range of motion.       Right knee: She exhibits normal range of motion, no swelling, no effusion, no deformity, normal alignment, no LCL laxity, no bony tenderness and no MCL laxity.  Neurological: She is alert and oriented to person, place, and time. She has normal strength. No cranial nerve deficit or sensory deficit. Coordination normal. GCS eye subscore is 4. GCS verbal subscore is 5. GCS motor subscore is 6.  Skin: Skin is warm and dry. Abrasion noted. No rash noted. No cyanosis.     Psychiatric: She has a normal mood and affect. Her speech is normal and behavior is normal. Thought content normal.  Nursing note and vitals reviewed.    ED Treatments / Results  Labs (all labs ordered are listed, but only abnormal results are displayed) Labs Reviewed - No data to display  EKG  EKG Interpretation None       Radiology Dg Knee Complete 4 Views Right  Result Date: 12/12/2016 CLINICAL DATA:  Right knee pain after fall. EXAM: RIGHT KNEE - COMPLETE 4+ VIEW COMPARISON:  Radiographs 05/24/2011 FINDINGS: No evidence of fracture, dislocation, or joint effusion. No evidence of arthropathy or other focal bone abnormality. Soft tissues are unremarkable. IMPRESSION: Negative radiographs of the right knee. Electronically Signed   By: Rubye OaksMelanie  Ehinger M.D.   On: 12/12/2016 23:15    Procedures Procedures (including critical care time)  Medications Ordered in ED Medications - No data to  display   Initial Impression / Assessment and Plan / ED Course  I have reviewed the triage vital signs and the nursing notes.  Pertinent labs & imaging results that were available during my care of the patient were reviewed by me and considered in my medical decision making (see chart for details).     Patient presents with complaints of knee injury after falling through a floor. She has evidence of a very superficial abrasion over the patella, no evidence of any other significant injury. There is no knee effusion. She has normal range of motion. There  is no ligamentous instability. X-ray was negative.  Final Clinical Impressions(s) / ED Diagnoses   Final diagnoses:  Contusion of right knee, initial encounter    New Prescriptions New Prescriptions   MELOXICAM (MOBIC) 15 MG TABLET    Take 1 tablet (15 mg total) by mouth daily.     Gilda Crease, MD 12/12/16 [phone]

## 2016-12-12 NOTE — ED Triage Notes (Signed)
Pt reports falling thru the floor (3 ft) of her bathroom. Pt reports sharp pain in her right knee pain.

## 2017-02-23 ENCOUNTER — Emergency Department (HOSPITAL_COMMUNITY): Payer: Self-pay

## 2017-02-23 ENCOUNTER — Encounter (HOSPITAL_COMMUNITY): Payer: Self-pay | Admitting: Emergency Medicine

## 2017-02-23 ENCOUNTER — Emergency Department (HOSPITAL_COMMUNITY)
Admission: EM | Admit: 2017-02-23 | Discharge: 2017-02-23 | Disposition: A | Discharge status: Home / Self Care | Payer: Self-pay | Attending: Emergency Medicine | Admitting: Emergency Medicine

## 2017-02-23 DIAGNOSIS — Y929 Unspecified place or not applicable: Secondary | ICD-10-CM | POA: Insufficient documentation

## 2017-02-23 DIAGNOSIS — Y939 Activity, unspecified: Secondary | ICD-10-CM | POA: Insufficient documentation

## 2017-02-23 DIAGNOSIS — Z87891 Personal history of nicotine dependence: Secondary | ICD-10-CM | POA: Insufficient documentation

## 2017-02-23 DIAGNOSIS — Y999 Unspecified external cause status: Secondary | ICD-10-CM | POA: Insufficient documentation

## 2017-02-23 DIAGNOSIS — W228XXA Striking against or struck by other objects, initial encounter: Secondary | ICD-10-CM | POA: Insufficient documentation

## 2017-02-23 DIAGNOSIS — S93601A Unspecified sprain of right foot, initial encounter: Secondary | ICD-10-CM | POA: Insufficient documentation

## 2017-02-23 DIAGNOSIS — Z791 Long term (current) use of non-steroidal anti-inflammatories (NSAID): Secondary | ICD-10-CM | POA: Insufficient documentation

## 2017-02-23 DIAGNOSIS — Z79899 Other long term (current) drug therapy: Secondary | ICD-10-CM | POA: Insufficient documentation

## 2017-02-23 NOTE — Discharge Instructions (Signed)
Elevate and apply ice packs on and off to your foot. Use of crutches for weightbearing as needed. Call one of the orthopedic doctors listed to arrange a follow-up appointment in one week if not improving.

## 2017-02-23 NOTE — ED Provider Notes (Signed)
[facility] Provider Note   CSN: [id number] Arrival date & time: 02/23/17  2105     History   Chief Complaint Chief Complaint  Patient presents with  . Foot Pain    HPI Stacy [name] is a 31 y.o. female.  HPI   Stacy OldGina L [name] is a 31 y.o. female who presents to the Emergency Department complaining of right foot pain that began earlier tonight. She states that she kicked a door. She complains of pain to right foot with weightbearing and with bending. She has not tried any therapies for symptomatic relief. She denies numbness of the extremity,  knee pain, open wounds and bruising.   Past Medical History:  Diagnosis Date  . Carpal tunnel syndrome   . Migraine   . Obesity     Patient Active Problem List   Diagnosis Date Noted  . HSV-2 (herpes simplex virus 2) infection 08/07/2015  . Precordial pain 05/31/2013  . Obesity   . Carpal tunnel syndrome     Past Surgical History:  Procedure Laterality Date  . CESAREAN SECTION    . CESAREAN SECTION    . CHOLECYSTECTOMY    . MOUTH SURGERY    . TUBAL LIGATION    . WISDOM TOOTH EXTRACTION      OB History    Gravida Para Term Preterm AB Living   2 2 2     2    SAB TAB Ectopic Multiple Live Births                   Home Medications    Prior to Admission medications   Medication Sig Start Date End Date Taking? Authorizing Provider  acyclovir (ZOVIRAX) 400 MG tablet TAKE 1 TABLET BY MOUTH 3 TIMES A DAY Patient not taking: Reported on 08/06/2016 05/13/16   Donita BrooksPickard, Warren T, MD  Bismuth Subsalicylate (PEPTO-BISMOL) 262 MG TABS Take 1 tablet by mouth 2 (two) times daily.    [provider]  cephALEXin (KEFLEX) 500 MG capsule Take 1 capsule (500 mg total) by mouth 4 (four) times daily. 10/25/16   Mancel BaleWentz, Elliott, MD  fluconazole (DIFLUCAN) 150 MG tablet Take after you complete the antibiotic prescription, to treat a yeast infection 10/25/16   Mancel BaleWentz, Elliott, MD  ibuprofen (ADVIL,MOTRIN) 800 MG tablet Take 1 tablet  (800 mg total) by mouth 3 (three) times daily. 11/25/16   Rancour, Jeannett SeniorStephen, MD  meloxicam (MOBIC) 15 MG tablet Take 1 tablet (15 mg total) by mouth daily. 12/12/16   Gilda CreasePollina, Christopher J, MD  methocarbamol (ROBAXIN) 500 MG tablet Take 1 tablet (500 mg total) by mouth 2 (two) times daily. 11/25/16   Rancour, Jeannett SeniorStephen, MD  metroNIDAZOLE (FLAGYL) 500 MG tablet TAKE 1 TABLET (500 MG TOTAL) BY MOUTH 3 (THREE) TIMES DAILY. 08/05/16   Donita BrooksPickard, Warren T, MD  naproxen (NAPROSYN) 500 MG tablet Take 1 tablet (500 mg total) by mouth 2 (two) times daily with a meal. 08/22/16   Aseneth Hack, PA-C  ondansetron (ZOFRAN ODT) 4 MG disintegrating tablet 4mg  ODT q4 hours prn nausea/vomit 08/06/16   Bethann BerkshireZammit, Joseph, MD    Family History Family History  Problem Relation Age of Onset  . Diabetes Mother   . Hypertension Mother   . Hypertension Father     Social History Social History  Substance Use Topics  . Smoking status: Former Smoker    Packs/day: 0.04    Years: 1.50    Types: Cigarettes    Quit date: 11/21/2010  . Smokeless tobacco: Never  Used  . Alcohol use No     Allergies   Coconut flavor and Pineapple   Review of Systems Review of Systems  Constitutional: Negative for chills and fever.  Musculoskeletal: Positive for arthralgias (right foot pain) and joint swelling. Negative for back pain.  Skin: Negative for color change and wound.  Neurological: Negative for weakness and numbness.  All other systems reviewed and are negative.    Physical Exam Updated Vital Signs BP 128/73 (BP Location: Right Arm)   Pulse 67   Temp 97.8 F (36.6 C) (Oral)   Resp 18   Wt 111.1 kg (245 lb)   LMP 01/24/2017   SpO2 100%   BMI 38.37 kg/m   Physical Exam  Constitutional: She is oriented to person, place, and time. She appears well-developed and well-nourished. No distress.  HENT:  Head: Normocephalic and atraumatic.  Neck: Normal range of motion.  Cardiovascular: Normal rate, regular rhythm and intact  distal pulses.   Pulmonary/Chest: Effort normal and breath sounds normal.  Musculoskeletal: She exhibits tenderness. She exhibits no edema or deformity.  ttp of the anterior and lateral right foot.  Mild edema.  No bony deformity.  No proximal tenderness.  Neurological: She is alert and oriented to person, place, and time. No sensory deficit. She exhibits normal muscle tone. Coordination normal.  Skin: Skin is warm and dry. Capillary refill takes less than 2 seconds.  Psychiatric: She has a normal mood and affect.  Nursing note and vitals reviewed.    ED Treatments / Results  Labs (all labs ordered are listed, but only abnormal results are displayed) Labs Reviewed - No data to display  EKG  EKG Interpretation None       Radiology Dg Foot Complete Right  Result Date: 02/23/2017 CLINICAL DATA:  Right foot pain.  Kicked a H[employer]icult[employer], commercial. EXAM: RIGHT FOOT COMPLETE - 3+ VIEW COMPARISON:  Radiographs 04/02/2014 FINDINGS: There is no evidence of fracture or dislocation. There is an os navicular, typically incidental. There is no evidence of arthropathy or other focal bone abnormality. Soft tissues are unremarkable. IMPRESSION: Negative radiographs of the right foot. Electronically Signed   By: Rubye Oaks M.D.   On: 02/23/2017 22:53    Procedures Procedures (including critical care time)  Medications Ordered in ED Medications - No data to display   Initial Impression / Assessment and Plan / ED Course  I have reviewed the triage vital signs and the nursing notes.  Pertinent labs & imaging results that were available during my care of the patient were reviewed by me and considered in my medical decision making (see chart for details).     X-ray negative for fracture. Remains neurovascularly intact.Likely sprain.  ASO and postop shoe applied. Patient was given crutches. She agrees to RICE therapy and orthopedic follow-up if not improving. Recommended ibuprofen every 6 hours as  needed for pain.  Final Clinical Impressions(s) / ED Diagnoses   Final diagnoses:  Sprain of right foot, initial encounter    New Prescriptions New Prescriptions   No medications on file     Pauline Aus, PA-C 02/23/17 2322    Gilda Crease, MD 02/24/17 [phone]

## 2017-02-23 NOTE — ED Triage Notes (Signed)
Patient c/o right foot pain. Patient states she kicked a door today and felt a pop. Patient states difficult to put pressure on.

## 2017-04-26 ENCOUNTER — Emergency Department (HOSPITAL_COMMUNITY)
Admission: EM | Admit: 2017-04-26 | Discharge: 2017-04-26 | Disposition: A | Discharge status: Home / Self Care | Payer: Self-pay | Attending: Emergency Medicine | Admitting: Emergency Medicine

## 2017-04-26 ENCOUNTER — Encounter (HOSPITAL_COMMUNITY): Payer: Self-pay | Admitting: Emergency Medicine

## 2017-04-26 ENCOUNTER — Emergency Department (HOSPITAL_COMMUNITY): Payer: Self-pay

## 2017-04-26 DIAGNOSIS — J111 Influenza due to unidentified influenza virus with other respiratory manifestations: Secondary | ICD-10-CM

## 2017-04-26 DIAGNOSIS — R69 Illness, unspecified: Secondary | ICD-10-CM

## 2017-04-26 DIAGNOSIS — R509 Fever, unspecified: Secondary | ICD-10-CM | POA: Insufficient documentation

## 2017-04-26 DIAGNOSIS — R05 Cough: Secondary | ICD-10-CM | POA: Insufficient documentation

## 2017-04-26 DIAGNOSIS — Z79899 Other long term (current) drug therapy: Secondary | ICD-10-CM | POA: Insufficient documentation

## 2017-04-26 DIAGNOSIS — R0981 Nasal congestion: Secondary | ICD-10-CM | POA: Insufficient documentation

## 2017-04-26 MED ORDER — BENZONATATE 100 MG PO CAPS: 100.0000 mg | ORAL_CAPSULE | Freq: Three times a day (TID) | ORAL | 0 refills | Status: DC | PRN

## 2017-04-26 MED ORDER — FLUTICASONE PROPIONATE 50 MCG/ACT NA SUSP: 1.0000 | Freq: Every day | NASAL | 2 refills | Status: DC

## 2017-04-26 NOTE — ED Triage Notes (Signed)
PT c/o fever, body aches, cough unrelieved by OTC mucinex, nyquil & delsym. PT states other family members with same symptoms this week as well. PT c/o green productive sputum.

## 2017-04-26 NOTE — ED Provider Notes (Signed)
[name] Provider Note   CSN: [id number] Arrival date & time: 04/26/17  1031     History   Chief Complaint Chief Complaint  Patient presents with  . Generalized Body Aches    HPI Stacy [name] is a 31 y.o. female presenting to the ED with complaints of cough x 5 days. Describes the cough as productive with green sputum. Reports associated nasal congestion, rhinorrhea, body aches, chills, and fever with temp max of 103. Fever has resolved with Advil and Tylenol. Has tried Delsym and Mucinex over the counter without relief. Relays sick contacts with children and boyfriend each of which have improved with OTC medications. Patient did not receive flu vaccine this year. Denies sore throat, ear pain, N/V/D, abdominal pain, chest pain, or dyspnea.   HPI  Past Medical History:  Diagnosis Date  . Carpal tunnel syndrome   . Migraine   . Obesity     Patient Active Problem List   Diagnosis Date Noted  . HSV-2 (herpes simplex virus 2) infection 08/07/2015  . Precordial pain 05/31/2013  . Obesity   . Carpal tunnel syndrome     Past Surgical History:  Procedure Laterality Date  . CESAREAN SECTION    . CESAREAN SECTION    . CHOLECYSTECTOMY    . MOUTH SURGERY    . TUBAL LIGATION    . WISDOM TOOTH EXTRACTION      OB History    Gravida Para Term Preterm AB Living   2 2 2     2    SAB TAB Ectopic Multiple Live Births                   Home Medications    Prior to Admission medications   Medication Sig Start Date End Date Taking? Authorizing Provider  acyclovir (ZOVIRAX) 400 MG tablet TAKE 1 TABLET BY MOUTH 3 TIMES A DAY Patient not taking: Reported on 08/06/2016 05/13/16   Donita BrooksPickard, Warren T, MD  Bismuth Subsalicylate (PEPTO-BISMOL) 262 MG TABS Take 1 tablet by mouth 2 (two) times daily.    [provider]  cephALEXin (KEFLEX) 500 MG capsule Take 1 capsule (500 mg total) by mouth 4 (four) times daily. 10/25/16   Mancel BaleWentz, Elliott, MD  fluconazole  (DIFLUCAN) 150 MG tablet Take after you complete the antibiotic prescription, to treat a yeast infection 10/25/16   Mancel BaleWentz, Elliott, MD  ibuprofen (ADVIL,MOTRIN) 800 MG tablet Take 1 tablet (800 mg total) by mouth 3 (three) times daily. 11/25/16   Rancour, Jeannett SeniorStephen, MD  meloxicam (MOBIC) 15 MG tablet Take 1 tablet (15 mg total) by mouth daily. 12/12/16   Gilda CreasePollina, Christopher J, MD  methocarbamol (ROBAXIN) 500 MG tablet Take 1 tablet (500 mg total) by mouth 2 (two) times daily. 11/25/16   Rancour, Jeannett SeniorStephen, MD  metroNIDAZOLE (FLAGYL) 500 MG tablet TAKE 1 TABLET (500 MG TOTAL) BY MOUTH 3 (THREE) TIMES DAILY. 08/05/16   Donita BrooksPickard, Warren T, MD  naproxen (NAPROSYN) 500 MG tablet Take 1 tablet (500 mg total) by mouth 2 (two) times daily with a meal. 08/22/16   Triplett, Tammy, PA-C  ondansetron (ZOFRAN ODT) 4 MG disintegrating tablet 4mg  ODT q4 hours prn nausea/vomit 08/06/16   Bethann BerkshireZammit, Joseph, MD    Family History Family History  Problem Relation Age of Onset  . Diabetes Mother   . Hypertension Mother   . Hypertension Father     Social History Social History   Tobacco Use  . Smoking status: Former Smoker  Packs/day: 0.04    Years: 1.50    Pack years: 0.06    Types: Cigarettes    Last attempt to quit: 11/21/2010    Years since quitting: 6.4  . Smokeless tobacco: Never Used  Substance Use Topics  . Alcohol use: No    Alcohol/week: 0.0 oz  . Drug use: No     Allergies   Coconut flavor and Pineapple   Review of Systems Review of Systems  Constitutional: Positive for chills and fever.  HENT: Positive for congestion and rhinorrhea. Negative for ear pain and sore throat.   Respiratory: Positive for cough. Negative for shortness of breath and wheezing.   Cardiovascular: Negative for chest pain and palpitations.  Gastrointestinal: Negative for abdominal pain, constipation, diarrhea, nausea and vomiting.  Genitourinary: Negative for dysuria.  Musculoskeletal: Positive for myalgias.  Neurological:  Negative for dizziness.     Physical Exam Updated Vital Signs BP 133/73 (BP Location: Right Arm)   Pulse (!) 59   Temp 98.3 F (36.8 C) (Oral)   Resp 18   Ht 5\' 7"  (1.702 m)   Wt 108.9 kg (240 lb)   LMP 04/22/2017   SpO2 99%   BMI 37.59 kg/m   Physical Exam  Constitutional: She appears well-developed and well-nourished.  HENT:  Head: Normocephalic and atraumatic.  Right Ear: Hearing and tympanic membrane normal.  Left Ear: Hearing and tympanic membrane normal.  Nose: Mucosal edema and rhinorrhea present.  Mouth/Throat: Oropharynx is clear and moist. No oropharyngeal exudate.  Eyes: Pupils are equal, round, and reactive to light. Right eye exhibits no discharge. Left eye exhibits no discharge.  Neck: Normal range of motion. Neck supple.  Cardiovascular: Normal rate, regular rhythm and normal heart sounds.  Pulmonary/Chest: Effort normal and breath sounds normal. No respiratory distress. She has no wheezes. She has no rales.  Abdominal: Soft. There is no tenderness.  Lymphadenopathy:    She has no cervical adenopathy.     ED Treatments / Results   Radiology Dg Chest 2 View  Result Date: 04/26/2017 CLINICAL DATA:  Five days of cough and fever. Discontinued smoking in 2012 EXAM: CHEST  2 VIEW COMPARISON:  Chest x-ray of November 25, 2016 FINDINGS: The lungs are adequately inflated and clear. The heart and pulmonary vascularity are normal. The mediastinum is normal in width. There is no pleural effusion. The bony thorax is unremarkable. IMPRESSION: There is no active cardiopulmonary disease. Electronically Signed   By: David  [place] M.D.   On: 04/26/2017 11:17    Procedures Procedures (including critical care time)  Medications Ordered in ED Medications - No data to display   Initial Impression / Assessment and Plan / ED Course  I have reviewed the triage vital signs and the nursing notes.  Pertinent labs & imaging results that were available during my care of the patient  were reviewed by me and considered in my medical decision making (see chart for details).   31 year old female presents with classic flu-like illness. Doubt  pneumonia given negative chest x-ray and normal vitals. Likely viral given symptoms and sick contacts. Will treat supportively.  Vitals:   04/26/17 1056  BP: 133/73  Pulse: (!) 59  Resp: 18  Temp: 98.3 F (36.8 C)  TempSrc: Oral  SpO2: 99%  Weight: 108.9 kg (240 lb)  Height: 5\' 7"  (1.702 m)       Final Clinical Impressions(s) / ED Diagnoses   Final diagnoses:  Influenza-like illness    ED Discharge Orders  Ordered    fluticasone (FLONASE) 50 MCG/ACT nasal spray  Daily     04/26/17 1206    benzonatate (TESSALON) 100 MG capsule  3 times daily PRN     04/26/17 1206       Alistar Mcenery, Pleas Koch, PA-C 04/26/17 1214    Eber Hong, MD 04/27/17 [phone]

## 2017-04-26 NOTE — ED Notes (Signed)
ED Provider at bedside. 

## 2017-04-26 NOTE — ED Notes (Signed)
Pt walked to Xray.

## 2017-05-17 ENCOUNTER — Telehealth: Payer: Self-pay | Admitting: Family Medicine

## 2017-05-17 DIAGNOSIS — N760 Acute vaginitis: Secondary | ICD-10-CM

## 2017-05-17 MED ORDER — METRONIDAZOLE 500 MG PO TABS: 500.0000 mg | ORAL_TABLET | Freq: Three times a day (TID) | ORAL | 0 refills | Status: DC

## 2017-05-17 NOTE — Telephone Encounter (Signed)
Pt calling to request refill on Flagyl - it has been over 1 year since we saw pt - will refill x 1 but will ntbs before further refills.  Med sent to pharm and pt aware via vm to schedule apt

## 2017-06-23 ENCOUNTER — Telehealth: Payer: Self-pay | Admitting: Family Medicine

## 2017-06-23 DIAGNOSIS — B009 Herpesviral infection, unspecified: Secondary | ICD-10-CM

## 2017-06-23 NOTE — Telephone Encounter (Signed)
Pt called [facility] stating that she is having an HSV outbreak that she can not get under control. (pt sounds to be crying) and would like to know if you would call her in something. She does not have insurance at this time so she can not come in for an ov. Her lov was 08/15/15.

## 2017-06-24 MED ORDER — ACYCLOVIR 400 MG PO TABS: 400.0000 mg | ORAL_TABLET | Freq: Three times a day (TID) | ORAL | 0 refills | Status: DC

## 2017-06-24 NOTE — Telephone Encounter (Signed)
I assume genital herpes.  If so valtrex 500 mg pobid for 3 days.

## 2017-06-24 NOTE — Telephone Encounter (Signed)
Pt called and states that the Valtrex is too expensive could we call in the acyclovir - per Dr. Tanya NonesPickard ok to do - [facility]

## 2017-07-21 ENCOUNTER — Emergency Department (HOSPITAL_COMMUNITY)
Admission: EM | Admit: 2017-07-21 | Discharge: 2017-07-22 | Disposition: A | Discharge status: Home / Self Care | Payer: Self-pay | Attending: Emergency Medicine | Admitting: Emergency Medicine

## 2017-07-21 ENCOUNTER — Encounter (HOSPITAL_COMMUNITY): Payer: Self-pay | Admitting: Emergency Medicine

## 2017-07-21 DIAGNOSIS — Z79899 Other long term (current) drug therapy: Secondary | ICD-10-CM | POA: Insufficient documentation

## 2017-07-21 DIAGNOSIS — R69 Illness, unspecified: Secondary | ICD-10-CM

## 2017-07-21 DIAGNOSIS — Z87891 Personal history of nicotine dependence: Secondary | ICD-10-CM | POA: Insufficient documentation

## 2017-07-21 DIAGNOSIS — J111 Influenza due to unidentified influenza virus with other respiratory manifestations: Secondary | ICD-10-CM | POA: Insufficient documentation

## 2017-07-21 LAB — RAPID STREP SCREEN (MED CTR MEBANE ONLY): STREPTOCOCCUS, GROUP A SCREEN (DIRECT): NEGATIVE

## 2017-07-21 MED ORDER — IBUPROFEN 800 MG PO TABS
800.0000 mg | ORAL_TABLET | Freq: Once | ORAL | Status: AC
  Administered 2017-07-21: 800 mg via ORAL
  Filled 2017-07-21: qty 1

## 2017-07-21 MED ORDER — OSELTAMIVIR PHOSPHATE 75 MG PO CAPS: 75.0000 mg | ORAL_CAPSULE | Freq: Two times a day (BID) | ORAL | 0 refills | Status: DC

## 2017-07-21 MED ORDER — ONDANSETRON HCL 4 MG PO TABS
4.0000 mg | ORAL_TABLET | Freq: Once | ORAL | Status: AC
  Administered 2017-07-21: 4 mg via ORAL
  Filled 2017-07-21: qty 1

## 2017-07-21 MED ORDER — OSELTAMIVIR PHOSPHATE 75 MG PO CAPS
75.0000 mg | ORAL_CAPSULE | Freq: Once | ORAL | Status: AC
  Administered 2017-07-21: 75 mg via ORAL
  Filled 2017-07-21: qty 1

## 2017-07-21 NOTE — Discharge Instructions (Signed)
Your vital signs are within normal limits at this time.  Your oxygen level is 99% on room air.  Within normal limits.  Your rapid strep screen is negative for strep throat.  Given your history, your exposure, and your examination, I think you have a flu or flulike illness.  Please wash hands frequently.  Please wipe down surfaces.  Please increase fluids.  Use Tamiflu 2 times daily until all taken.  Please use 600 mg of ibuprofen with breakfast, lunch, dinner, and at bedtime.  It is important that you increase water, Gatorade, juices, etc.  Please use your mask until symptoms have resolved.  See your primary physician or return to the emergency department if symptoms are not improving.

## 2017-07-21 NOTE — ED Provider Notes (Signed)
[facility] EMERGENCY DEPARTMENT Provider Note   CSN: [id number] Arrival date & time: 07/21/17  2200     History   Chief Complaint Chief Complaint  Patient presents with  . Fever  . Sore Throat    HPI Stacy [name] is a 32 y.o. female.  Patient is a 32 year old female who presents to the emergency department with a complaint of fever and sore throat.  The patient states that she has been sick for the last last 3-4 days.  She has been noticing increasing symptoms including sore throat, fever, body aches, chills, headache, and generally not feeling well.  It is of note that 8 coworkers have been diagnosed with flu.  She has had a temperature maximum of 103.  She has been able to take Tylenol and ibuprofen to try to keep the temperature down to a minimum.  She is drinking, but states she has very little appetite.  She has very little energy.  She presents now for evaluation.   The history is provided by the patient.  Fever   Associated symptoms include congestion, sore throat and cough. Pertinent negatives include no chest pain.  Sore Throat  Pertinent negatives include no chest pain, no abdominal pain and no shortness of breath.    Past Medical History:  Diagnosis Date  . Carpal tunnel syndrome   . Migraine   . Obesity     Patient Active Problem List   Diagnosis Date Noted  . HSV-2 (herpes simplex virus 2) infection 08/07/2015  . Precordial pain 05/31/2013  . Obesity   . Carpal tunnel syndrome     Past Surgical History:  Procedure Laterality Date  . CESAREAN SECTION    . CESAREAN SECTION    . CHOLECYSTECTOMY    . MOUTH SURGERY    . TUBAL LIGATION    . WISDOM TOOTH EXTRACTION      OB History    Gravida Para Term Preterm AB Living   2 2 2     2    SAB TAB Ectopic Multiple Live Births                   Home Medications    Prior to Admission medications   Medication Sig Start Date End Date Taking? Authorizing Provider  acetaminophen (TYLENOL) 500 MG  tablet Take 500 mg by mouth every 6 (six) hours as needed for mild pain.   Yes [provider]  guaiFENesin (MUCINEX) 600 MG 12 hr tablet Take 600 mg by mouth 2 (two) times daily as needed.   Yes [provider]  pseudoephedrine-acetaminophen (TYLENOL SINUS) 30-500 MG TABS tablet Take 1 tablet by mouth every 4 (four) hours as needed.   Yes [provider]  acyclovir (ZOVIRAX) 400 MG tablet Take 1 tablet (400 mg total) by mouth 3 (three) times daily. Patient not taking: Reported on 07/21/2017 06/24/17   Donita Brooks, MD  benzonatate (TESSALON) 100 MG capsule Take 1 capsule (100 mg total) 3 (three) times daily as needed by mouth for cough. Patient not taking: Reported on 07/21/2017 04/26/17   Petrucelli, Pleas Koch, PA-C    Family History Family History  Problem Relation Age of Onset  . Diabetes Mother   . Hypertension Mother   . Hypertension Father     Social History Social History   Tobacco Use  . Smoking status: Former Smoker    Packs/day: 0.04    Years: 1.50    Pack years: 0.06    Types: Cigarettes  Last attempt to quit: 11/21/2010    Years since quitting: 6.6  . Smokeless tobacco: Never Used  Substance Use Topics  . Alcohol use: No    Alcohol/week: 0.0 oz  . Drug use: No     Allergies   Coconut flavor and Pineapple   Review of Systems Review of Systems  Constitutional: Positive for appetite change, chills, fatigue and fever. Negative for activity change.       All ROS Neg except as noted in HPI  HENT: Positive for congestion, postnasal drip and sore throat. Negative for nosebleeds.   Eyes: Negative for photophobia and discharge.  Respiratory: Positive for cough. Negative for shortness of breath and wheezing.   Cardiovascular: Negative for chest pain and palpitations.  Gastrointestinal: Negative for abdominal pain and blood in stool.  Genitourinary: Negative for dysuria, frequency and hematuria.  Musculoskeletal: Positive for myalgias.  Negative for arthralgias, back pain and neck pain.  Skin: Negative.   Neurological: Negative for dizziness, seizures and speech difficulty.  Psychiatric/Behavioral: Negative for confusion and hallucinations.     Physical Exam Updated Vital Signs BP (!) 120/59 (BP Location: Right Arm)   Pulse 83   Temp 98.4 F (36.9 C) (Oral)   Resp 17   Ht 5\' 7"  (1.702 m)   Wt 108.9 kg (240 lb)   LMP 06/25/2017   SpO2 99%   BMI 37.59 kg/m   Physical Exam  Constitutional: She is oriented to person, place, and time. She appears well-developed and well-nourished.  Non-toxic appearance.  HENT:  Head: Normocephalic.  Right Ear: Tympanic membrane and external ear normal.  Left Ear: Tympanic membrane and external ear normal.  Nasal congestion present.  No pain to percussion over the sinuses.  No redness or swelling in the mastoid areas. Mild to moderate increased redness of the posterior pharynx.  Uvula is in the midline.  Airway is patent.  Eyes: EOM and lids are normal. Pupils are equal, round, and reactive to light.  Neck: Normal range of motion. Neck supple. Carotid bruit is not present.  Cardiovascular: Normal rate, regular rhythm, normal heart sounds, intact distal pulses and normal pulses.  Pulmonary/Chest: Breath sounds normal. No respiratory distress.  Coarse breath sounds present, no consolidation noted.  Patient speaks in complete sentences without problem.  Abdominal: Soft. Bowel sounds are normal. There is no tenderness. There is no guarding.  Musculoskeletal: Normal range of motion.  Lymphadenopathy:       Head (right side): No submandibular adenopathy present.       Head (left side): No submandibular adenopathy present.    She has no cervical adenopathy.  Neurological: She is alert and oriented to person, place, and time. She has normal strength. No cranial nerve deficit or sensory deficit.  Skin: Skin is warm and dry. No rash noted.  Psychiatric: She has a normal mood and affect.  Her speech is normal.  Nursing note and vitals reviewed.    ED Treatments / Results  Labs (all labs ordered are listed, but only abnormal results are displayed) Labs Reviewed  RAPID STREP [facility]CREEN (NOT [facility])  CULTURE, GROUP A STREP [facility])    EKG  EKG Interpretation None       Radiology No results found.  Procedures Procedures (including critical care time)  Medications Ordered in ED Medications  oseltamivir (TAMIFLU) capsule 75 mg (75 mg Oral Given 07/21/17 2341)  ibuprofen (ADVIL,MOTRIN) tablet 800 mg (800 mg Oral Given 07/21/17 2341)  ondansetron (ZOFRAN) tablet 4 mg (4 mg Oral Given  07/21/17 2342)     Initial Impression / Assessment and Plan / ED Course  I have reviewed the triage vital signs and the nursing notes.  Pertinent labs & imaging results that were available during my care of the patient were reviewed by me and considered in my medical decision making (see chart for details).       Final Clinical Impressions(s) / ED Diagnoses MDM  Vital signs are within normal limits.  Pulse oximetry is 99% on room air.  Rapid strep test is negative.  Patient has been around at least 8 coworkers who have flu.  Given the exposure, history, and examination.  I suspect the patient has influenza or influenza type illness.  Patient will be treated with ibuprofen every 6 hours and Tamiflu.  The patient has been strongly advised to use a mask until symptoms have resolved.  She is also been advised to increase fluids and to wash hands frequently.  Questions were answered.  Patient acknowledges understanding of these instructions and is in agreement.   Final diagnoses:  Influenza-like illness    ED Discharge Orders    None       Ivery Quale, PA-C 07/21/17 2356    Samuel Jester, DO 07/22/17 2333

## 2017-07-21 NOTE — ED Triage Notes (Signed)
Pt c/o sore throat, fevers up to 103F, chills, and body aches since Sunday.

## 2017-07-22 NOTE — ED Notes (Signed)
Pt ambulatory to waiting room. Pt verbalized understanding of discharge instructions.   

## 2017-07-24 LAB — CULTURE, GROUP A STREP (THRC)

## 2017-08-24 ENCOUNTER — Encounter: Payer: Self-pay | Admitting: Family Medicine

## 2017-08-24 ENCOUNTER — Ambulatory Visit: Payer: Self-pay | Admitting: Family Medicine

## 2017-08-24 ENCOUNTER — Other Ambulatory Visit: Payer: Self-pay

## 2017-08-24 VITALS — BP 116/70 | HR 89 | Temp 98.5°F | Resp 14 | Wt 250.0 lb

## 2017-08-24 DIAGNOSIS — B009 Herpesviral infection, unspecified: Secondary | ICD-10-CM

## 2017-08-24 DIAGNOSIS — N760 Acute vaginitis: Secondary | ICD-10-CM

## 2017-08-24 MED ORDER — METRONIDAZOLE 500 MG PO TABS: 500.0000 mg | ORAL_TABLET | Freq: Two times a day (BID) | ORAL | 5 refills | Status: DC

## 2017-08-24 MED ORDER — ACYCLOVIR 400 MG PO TABS: 400.0000 mg | ORAL_TABLET | Freq: Three times a day (TID) | ORAL | 5 refills | Status: DC

## 2017-08-24 NOTE — Progress Notes (Signed)
Subjective:    Patient ID: Stacy [name], female    DOB: 03/14/1986, 32 y.o.   MRN: [id number]  HPI Patient has a history of HSV-2 infections. She gets approximately 1 outbreak per month. She uses acyclovir 400 mg 3 times a day during an outbreak. She is requesting a refill on the medication which she typically takes for 5 days. She is also requesting a refill on Flagyl. Please see my office visit from 2017.  Patient gets BV after her periods frequently. As soon as she takes Flagyl, symptoms subsided 3 days. Symptoms consist of vaginal itching, a foul-smelling, opaque white discharge. I believe most likely the vaginal bleeding is upsetting the vaginal pH causing BV. We did try a hyalfem vaginal suppositories. I gave her samples to try for 5 days. She stated that it did not work. She is requesting of refill on Flagyl to use when she gets BV.  Patient is asymptomatic now. Past Medical History:  Diagnosis Date  . Carpal tunnel syndrome   . Migraine   . Obesity    Past Surgical History:  Procedure Laterality Date  . CESAREAN SECTION    . CESAREAN SECTION    . CHOLECYSTECTOMY    . MOUTH SURGERY    . TUBAL LIGATION    . WISDOM TOOTH EXTRACTION     Current Outpatient Medications on File Prior to Visit  Medication Sig Dispense Refill  . acetaminophen (TYLENOL) 500 MG tablet Take 500 mg by mouth every 6 (six) hours as needed for mild pain.    . pseudoephedrine-acetaminophen (TYLENOL SINUS) 30-500 MG TABS tablet Take 1 tablet by mouth every 4 (four) hours as needed.     No current facility-administered medications on file prior to visit.    Allergies  Allergen Reactions  . Coconut Flavor Other (See Comments)    Mouth/throat lesions   . Pineapple Other (See Comments)    Mouth/throat leasions   Social History   Socioeconomic History  . Marital status: Single    Spouse name: Not on file  . Number of children: 2  . Years of education: 10th   . Highest education level: Not on file    Social Needs  . Financial resource strain: Not on file  . Food insecurity - worry: Not on file  . Food insecurity - inability: Not on file  . Transportation needs - medical: Not on file  . Transportation needs - non-medical: Not on file  Occupational History  . Occupation: Designer, industrial/productCrew member at [employer]  Tobacco Use  . Smoking status: Former Smoker    Packs/day: 0.04    Years: 1.50    Pack years: 0.06    Types: Cigarettes    Last attempt to quit: 11/21/2010    Years since quitting: 6.7  . Smokeless tobacco: Never Used  Substance and Sexual Activity  . Alcohol use: No    Alcohol/week: 0.0 oz  . Drug use: No  . Sexual activity: Not Currently    Birth control/protection: Surgical  Other Topics Concern  . Not on file  Social History Narrative   Pt lives with 2 children, ages 369 & 7.   Consumes soda on regular basis, but has stopped in the last 3 weeks     Review of Systems  All other systems reviewed and are negative.      Objective:   Physical Exam  Cardiovascular: Normal rate, regular rhythm and normal heart sounds.  No murmur heard. Pulmonary/Chest: Effort normal and breath sounds normal.  No respiratory distress. She has no wheezes. She has no rales.  Abdominal: Soft. Bowel sounds are normal. She exhibits no distension. There is no tenderness. There is no rebound and no guarding.  Skin: No rash noted.  Vitals reviewed.         Assessment & Plan:  HSV-2 (herpes simplex virus 2) infection  HSV (herpes simplex virus) infection - Plan: acyclovir (ZOVIRAX) 400 MG tablet  Vaginitis and vulvovaginitis   I will refill the patient's acyclovir, 400 mg 3 times a day for 5 days. I will give the patient refills for her to take in the future. She has no insurance and therefore she prefers to get the medication as needed. I will give her Flagyl 500 mg one by mouth twice a day for 7 days. I will give her a few refills on this as well. When she gets insurance, I would recommend trying  boric acid suppositories around the time of her menstrual cycles to prevent recurrent BV

## 2017-09-23 ENCOUNTER — Encounter (HOSPITAL_COMMUNITY): Payer: Self-pay

## 2017-09-23 ENCOUNTER — Emergency Department (HOSPITAL_COMMUNITY)
Admission: EM | Admit: 2017-09-23 | Discharge: 2017-09-23 | Disposition: A | Discharge status: Home / Self Care | Payer: Medicaid Other | Attending: Emergency Medicine | Admitting: Emergency Medicine

## 2017-09-23 ENCOUNTER — Other Ambulatory Visit: Payer: Self-pay

## 2017-09-23 ENCOUNTER — Emergency Department (HOSPITAL_COMMUNITY): Payer: Medicaid Other

## 2017-09-23 DIAGNOSIS — R0789 Other chest pain: Secondary | ICD-10-CM | POA: Insufficient documentation

## 2017-09-23 DIAGNOSIS — Z87891 Personal history of nicotine dependence: Secondary | ICD-10-CM | POA: Insufficient documentation

## 2017-09-23 LAB — CBC WITH DIFFERENTIAL/PLATELET
Basophils Absolute: 0 10*3/uL (ref 0.0–0.1)
Basophils Relative: 0 %
EOS ABS: 0.3 10*3/uL (ref 0.0–0.7)
EOS PCT: 4 %
HCT: 37.2 % (ref 36.0–46.0)
HEMOGLOBIN: 12.2 g/dL (ref 12.0–15.0)
LYMPHS ABS: 2.8 10*3/uL (ref 0.7–4.0)
Lymphocytes Relative: 32 %
MCH: 25.7 pg — AB (ref 26.0–34.0)
MCHC: 32.8 g/dL (ref 30.0–36.0)
MCV: 78.3 fL (ref 78.0–100.0)
MONOS PCT: 7 %
Monocytes Absolute: 0.6 10*3/uL (ref 0.1–1.0)
Neutro Abs: 5 10*3/uL (ref 1.7–7.7)
Neutrophils Relative %: 57 %
PLATELETS: 253 10*3/uL (ref 150–400)
RBC: 4.75 MIL/uL (ref 3.87–5.11)
RDW: 13.3 % (ref 11.5–15.5)
WBC: 8.8 10*3/uL (ref 4.0–10.5)

## 2017-09-23 LAB — BASIC METABOLIC PANEL
Anion gap: 9 (ref 5–15)
BUN: 10 mg/dL (ref 6–20)
CALCIUM: 8.9 mg/dL (ref 8.9–10.3)
CO2: 23 mmol/L (ref 22–32)
CREATININE: 0.67 mg/dL (ref 0.44–1.00)
Chloride: 106 mmol/L (ref 101–111)
GFR calc Af Amer: >60 mL/min (ref >60)
GLUCOSE: 103 mg/dL — AB (ref 65–99)
Potassium: 4 mmol/L (ref 3.5–5.1)
Sodium: 138 mmol/L (ref 135–145)

## 2017-09-23 LAB — I-STAT BETA HCG BLOOD, ED (MC, WL, AP ONLY)

## 2017-09-23 LAB — D-DIMER, QUANTITATIVE: D-Dimer, Quant: 0.42 ug/mL-FEU (ref 0.00–0.50)

## 2017-09-23 LAB — TROPONIN I: Troponin I: <0.03 ng/mL (ref <0.03)

## 2017-09-23 MED ORDER — HYDROCODONE-ACETAMINOPHEN 5-325 MG PO TABS
1.0000 | ORAL_TABLET | Freq: Once | ORAL | Status: AC
  Administered 2017-09-23: 1 via ORAL
  Filled 2017-09-23: qty 1

## 2017-09-23 MED ORDER — METHOCARBAMOL 500 MG PO TABS: 1000.0000 mg | ORAL_TABLET | Freq: Four times a day (QID) | ORAL | 0 refills | Status: DC | PRN

## 2017-09-23 MED ORDER — NAPROXEN 250 MG PO TABS: 250.0000 mg | ORAL_TABLET | Freq: Two times a day (BID) | ORAL | 0 refills | Status: DC | PRN

## 2017-09-23 NOTE — ED Provider Notes (Signed)
[facility] EMERGENCY DEPARTMENT Provider Note   CSN: [id number] Arrival date & time: 09/23/17  1342     History   Chief Complaint Chief Complaint  Patient presents with  . Chest Pain    HPI Stacy [name] is a 32 y.o. female.  HPI  Pt was seen at 1415. Per pt, c/o gradual onset and persistence of constant left upper chest wall "pain" that began 4 days ago. Pt states the pain radiates into her left shoulder. Describes the pain as constant and "sharp." Pain worsens with palpation of the area and movement. Denies injury, no palpitations, no SOB/cough, no abd pain, no N/V/D, no fevers, no rash, no focal motor weakness, no tingling/numbness in extremities. The symptoms have been associated with no other complaints. The patient has a significant history of similar symptoms previously, recently being evaluated for this complaint and multiple prior evals for same.     Past Medical History:  Diagnosis Date  . Carpal tunnel syndrome   . Migraine   . Obesity     Patient Active Problem List   Diagnosis Date Noted  . HSV-2 (herpes simplex virus 2) infection 08/07/2015  . Precordial pain 05/31/2013  . Obesity   . Carpal tunnel syndrome     Past Surgical History:  Procedure Laterality Date  . CESAREAN SECTION    . CESAREAN SECTION    . CHOLECYSTECTOMY    . MOUTH SURGERY    . TUBAL LIGATION    . WISDOM TOOTH EXTRACTION       OB History    Gravida  2   Para  2   Term  2   Preterm      AB      Living  2     SAB      TAB      Ectopic      Multiple      Live Births               Home Medications    Prior to Admission medications   Medication Sig Start Date End Date Taking? Authorizing Provider  ibuprofen (ADVIL,MOTRIN) 200 MG tablet Take 200 mg by mouth every 6 (six) hours as needed for mild pain (takes 800 mg at a time).   Yes [provider]    Family History Family History  Problem Relation Age of Onset  . Diabetes Mother   .  Hypertension Mother   . Hypertension Father     Social History Social History   Tobacco Use  . Smoking status: Former Smoker    Packs/day: 0.04    Years: 1.50    Pack years: 0.06    Types: Cigarettes    Last attempt to quit: 11/21/2010    Years since quitting: 6.8  . Smokeless tobacco: Never Used  Substance Use Topics  . Alcohol use: No    Alcohol/week: 0.0 oz  . Drug use: No     Allergies   Coconut flavor and Pineapple   Review of Systems Review of Systems ROS: Statement: All systems negative except as marked or noted in the HPI; Constitutional: Negative for fever and chills. ; ; Eyes: Negative for eye pain, redness and discharge. ; ; ENMT: Negative for ear pain, hoarseness, nasal congestion, sinus pressure and sore throat. ; ; Cardiovascular: Negative for palpitations, diaphoresis, dyspnea and peripheral edema. ; ; Respiratory: Negative for cough, wheezing and stridor. ; ; Gastrointestinal: Negative for nausea, vomiting, diarrhea, abdominal pain, blood in stool, hematemesis,  jaundice and rectal bleeding. . ; ; Genitourinary: Negative for dysuria, flank pain and hematuria. ; ; Musculoskeletal: +chest wall pain. Negative for back pain and neck pain. Negative for swelling and trauma.; ; Skin: Negative for pruritus, rash, abrasions, blisters, bruising and skin lesion.; ; Neuro: Negative for headache, lightheadedness and neck stiffness. Negative for weakness, altered level of consciousness, altered mental status, extremity weakness, paresthesias, involuntary movement, seizure and syncope.       Physical Exam Updated Vital Signs BP 99/62 (BP Location: Right Arm)   Pulse 69   Temp 98.2 F (36.8 C) (Oral)   Resp 16   Ht 5\' 7"  (1.702 m)   Wt 111.1 kg (245 lb)   LMP 08/26/2017   SpO2 99%   BMI 38.37 kg/m    Patient Vitals for the past 24 hrs:  BP Temp Temp src Pulse Resp SpO2 Height Weight  09/23/17 1607 120/82 - - 65 17 100 % - -  09/23/17 1500 116/77 - - 62 19 99 % - -    09/23/17 1430 122/67 - - (!) 53 16 98 % - -  09/23/17 1400 133/67 - - 75 (!) 29 92 % - -  09/23/17 1349 99/62 98.2 F (36.8 C) Oral 69 16 99 % 5\' 7"  (1.702 m) 111.1 kg (245 lb)     Physical Exam 1420: Physical examination:  Nursing notes reviewed; Vital signs and O2 SAT reviewed;  Constitutional: Well developed, Well nourished, Well hydrated, In no acute distress; Head:  Normocephalic, atraumatic; Eyes: EOMI, PERRL, No scleral icterus; ENMT: Mouth and pharynx normal, Mucous membranes moist; Neck: Supple, Full range of motion, No lymphadenopathy; Cardiovascular: Regular rate and rhythm, No gallop; Respiratory: Breath sounds clear & equal bilaterally, No wheezes.  Speaking full sentences with ease, Normal respiratory effort/excursion; Chest: +left upper anterior chest wall tender to palp which reproduces pt's symptoms. No rash, no deformity, no soft tissue crepitus.  Movement normal; Abdomen: Soft, Nontender, Nondistended, Normal bowel sounds; Genitourinary: No CVA tenderness; Extremities: Peripheral pulses normal, No tenderness, No edema, No calf edema or asymmetry.; Neuro: AA&Ox3, Major CN grossly intact.  Speech clear. No gross focal motor or sensory deficits in extremities.; Skin: Color normal, Warm, Dry.   ED Treatments / Results  Labs (all labs ordered are listed, but only abnormal results are displayed)   EKG EKG Interpretation  Date/Time:  Thursday September 23 2017 13:53:22 EDT Ventricular Rate:  60 PR Interval:  132 QRS Duration: 86 QT Interval:  388 QTC Calculation: 388 R Axis:   48 Text Interpretation:  Normal sinus rhythm Normal ECG When compared with ECG of 07/15/2016 No significant change was found Confirmed by Samuel Jester [phone]) on 09/23/2017 2:17:03 PM   Radiology   Procedures Procedures (including critical care time)  Medications Ordered in ED Medications  HYDROcodone-acetaminophen (NORCO/VICODIN) 5-325 MG per tablet 1 tablet (has no administration in time  range)     Initial Impression / Assessment and Plan / ED Course  I have reviewed the triage vital signs and the nursing notes.  Pertinent labs & imaging results that were available during my care of the patient were reviewed by me and considered in my medical decision making (see chart for details).  MDM Reviewed: previous chart, nursing note and vitals Reviewed previous: labs and ECG Interpretation: labs, ECG and x-ray   Results for orders placed or performed during the hospital encounter of 09/23/17  Basic metabolic panel  Result Value Ref Range   Sodium 138 135 - 145 mmol/L  Potassium 4.0 3.5 - 5.1 mmol/L   Chloride 106 101 - 111 mmol/L   CO2 23 22 - 32 mmol/L   Glucose, Bld 103 (H) 65 - 99 mg/dL   BUN 10 6 - 20 mg/dL   Creatinine, Ser 4.780.67 0.44 - 1.00 mg/dL   Calcium 8.9 8.9 - 29.510.3 mg/dL   GFR calc non Af Amer >60 >60 mL/min   GFR calc Af Amer >60 >60 mL/min   Anion gap 9 5 - 15  Troponin I  Result Value Ref Range   Troponin I <0.03 <0.03 ng/mL  D-dimer, quantitative  Result Value Ref Range   D-Dimer, Quant 0.42 0.00 - 0.50 ug/mL-FEU  CBC with Differential/Platelet  Result Value Ref Range   WBC 8.8 4.0 - 10.5 K/uL   RBC 4.75 3.87 - 5.11 MIL/uL   Hemoglobin 12.2 12.0 - 15.0 g/dL   HCT 62.137.2 30.836.0 - 65.746.0 %   MCV 78.3 78.0 - 100.0 fL   MCH 25.7 (L) 26.0 - 34.0 pg   MCHC 32.8 30.0 - 36.0 g/dL   RDW 84.613.3 96.211.5 - 95.215.5 %   Platelets 253 150 - 400 K/uL   Neutrophils Relative % 57 %   Neutro Abs 5.0 1.7 - 7.7 K/uL   Lymphocytes Relative 32 %   Lymphs Abs 2.8 0.7 - 4.0 K/uL   Monocytes Relative 7 %   Monocytes Absolute 0.6 0.1 - 1.0 K/uL   Eosinophils Relative 4 %   Eosinophils Absolute 0.3 0.0 - 0.7 K/uL   Basophils Relative 0 %   Basophils Absolute 0.0 0.0 - 0.1 K/uL  I-Stat beta hCG blood, ED  Result Value Ref Range   I-stat hCG, quantitative <5.0 <5 mIU/mL   Comment 3           Dg Chest 2 View Result Date: 09/23/2017 CLINICAL DATA:  Chest pain EXAM: CHEST - 2  VIEW COMPARISON:  04/26/2017 FINDINGS: Heart size and vascularity normal. Lung volume normal. Small areas of patchy airspace disease in the lateral lung base bilaterally may represent atelectasis. Negative for effusion. Skeletal structures intact IMPRESSION: Minimal bibasilar airspace disease, probable atelectasis versus pneumonia. Electronically Signed   By: Marlan Palauharles  Clark M.D.   On: 09/23/2017 15:46    1610:  CXR most likely atelectasis, doubt pneumonia given normal WBC count, pt remains afebrile and without complaints of any URI symptoms. Doubt PE as cause for symptoms with normal d-dimer and low risk Wells.  Doubt ACS as cause for symptoms with normal troponin and unchanged EKG from previous after 4 days of constant symptoms. Tx symptomatically at this time. Dx and testing d/w pt.  Questions answered.  Verb understanding, agreeable to d/c home with outpt f/u.    Final Clinical Impressions(s) / ED Diagnoses   Final diagnoses:  None    ED Discharge Orders    None       Samuel JesterMcManus, Westyn Driggers, DO 09/27/17 [id number]

## 2017-09-23 NOTE — Discharge Instructions (Signed)
Take the prescriptions as directed.  Also take over the counter tylenol, as directed on packaging, as needed for discomfort. Apply moist heat or ice to the area(s) of discomfort, for 15 minutes at a time, several times per day for the next few days.  Do not fall asleep on a heating or ice pack.  Call your regular medical doctor tomorrow to schedule a follow up appointment in the next 3 days.  Return to the Emergency Department immediately if worsening.

## 2017-09-23 NOTE — ED Triage Notes (Signed)
Pt is having left sided chest pain that is going into her right shoulder. States this has been going on for 4 days. Describes pain as sharp. Has a family history of heart attacks. Pt took ibuprofen this morning with no relief

## 2018-05-07 ENCOUNTER — Emergency Department (HOSPITAL_COMMUNITY)
Admission: EM | Admit: 2018-05-07 | Discharge: 2018-05-07 | Disposition: A | Discharge status: Home / Self Care | Payer: Self-pay | Attending: Emergency Medicine | Admitting: Emergency Medicine

## 2018-05-07 ENCOUNTER — Other Ambulatory Visit: Payer: Self-pay

## 2018-05-07 ENCOUNTER — Encounter (HOSPITAL_COMMUNITY): Payer: Self-pay | Admitting: Emergency Medicine

## 2018-05-07 DIAGNOSIS — R21 Rash and other nonspecific skin eruption: Secondary | ICD-10-CM | POA: Insufficient documentation

## 2018-05-07 DIAGNOSIS — W57XXXA Bitten or stung by nonvenomous insect and other nonvenomous arthropods, initial encounter: Secondary | ICD-10-CM | POA: Insufficient documentation

## 2018-05-07 DIAGNOSIS — Z87891 Personal history of nicotine dependence: Secondary | ICD-10-CM | POA: Insufficient documentation

## 2018-05-07 MED ORDER — CEPHALEXIN 500 MG PO CAPS: 500.0000 mg | ORAL_CAPSULE | Freq: Four times a day (QID) | ORAL | 0 refills | Status: DC

## 2018-05-07 MED ORDER — BACITRACIN-NEOMYCIN-POLYMYXIN OINTMENT TUBE
TOPICAL_OINTMENT | Freq: Once | CUTANEOUS | Status: DC
  Filled 2018-05-07: qty 14.17

## 2018-05-07 MED ORDER — BACITRACIN-NEOMYCIN-POLYMYXIN 400-5-5000 EX OINT
TOPICAL_OINTMENT | CUTANEOUS | Status: AC
  Filled 2018-05-07: qty 4

## 2018-05-07 MED ORDER — BACITRACIN-NEOMYCIN-POLYMYXIN 400-5-5000 EX OINT
TOPICAL_OINTMENT | CUTANEOUS | Status: AC
  Administered 2018-05-07: 1
  Filled 2018-05-07: qty 1

## 2018-05-07 NOTE — Discharge Instructions (Addendum)
Your rash is from an unclear course, but does appear to have an early folliculitis so is being treated with antibiotics.  Take the entire course. You may also use an antibiotic ointment topically for better relief (neosporin or triple antibiotic ointment is good, no prescription needed).  Use telfa pads (no stick dressing) to avoid continued friction as discussed.

## 2018-05-07 NOTE — ED Provider Notes (Signed)
[facility] EMERGENCY DEPARTMENT Provider Note   CSN: [id number] Arrival date & time: 05/07/18  1223     History   Chief Complaint Chief Complaint  Patient presents with  . Rash    HPI Stacy [name] is a 32 y.o. female presenting with a sore/burning quality rash on her bilateral medial upper thighs since wearing a new pair of unwashed jeans at work yesterday.  Additionally she woke today with a tender "bump" on her right posterior calf which she is concerned about possible insect bite.  She has applied vaseline to the thigh rash which helped improve the burning symptom.  She denies fever, chill or other complaint.  The history is provided by the patient.    Past Medical History:  Diagnosis Date  . Carpal tunnel syndrome   . Migraine   . Obesity     Patient Active Problem List   Diagnosis Date Noted  . HSV-2 (herpes simplex virus 2) infection 08/07/2015  . Precordial pain 05/31/2013  . Obesity   . Carpal tunnel syndrome     Past Surgical History:  Procedure Laterality Date  . CESAREAN SECTION    . CESAREAN SECTION    . CHOLECYSTECTOMY    . MOUTH SURGERY    . TUBAL LIGATION    . WISDOM TOOTH EXTRACTION       OB History    Gravida  2   Para  2   Term  2   Preterm      AB      Living  2     SAB      TAB      Ectopic      Multiple      Live Births               Home Medications    Prior to Admission medications   Medication Sig Start Date End Date Taking? Authorizing Provider  cephALEXin (KEFLEX) 500 MG capsule Take 1 capsule (500 mg total) by mouth 4 (four) times daily. 05/07/18   Burgess AmorIdol, Cobin Cadavid, PA-C  ibuprofen (ADVIL,MOTRIN) 200 MG tablet Take 200 mg by mouth every 6 (six) hours as needed for mild pain (takes 800 mg at a time).    [provider]  methocarbamol (ROBAXIN) 500 MG tablet Take 2 tablets (1,000 mg total) by mouth 4 (four) times daily as needed for muscle spasms (muscle spasm/pain). 09/23/17   Samuel JesterMcManus, Kathleen, DO    naproxen (NAPROSYN) 250 MG tablet Take 1 tablet (250 mg total) by mouth 2 (two) times daily as needed for mild pain or moderate pain (take with food). 09/23/17   Samuel JesterMcManus, Kathleen, DO    Family History Family History  Problem Relation Age of Onset  . Diabetes Mother   . Hypertension Mother   . Hypertension Father     Social History Social History   Tobacco Use  . Smoking status: Former Smoker    Packs/day: 0.04    Years: 1.50    Pack years: 0.06    Types: Cigarettes    Last attempt to quit: 11/21/2010    Years since quitting: 7.4  . Smokeless tobacco: Never Used  Substance Use Topics  . Alcohol use: No    Alcohol/week: 0.0 standard drinks  . Drug use: No     Allergies   Coconut flavor and Pineapple   Review of Systems Review of Systems  Constitutional: Negative for chills and fever.  Respiratory: Negative for shortness of breath and wheezing.  Gastrointestinal: Negative for abdominal pain and nausea.  Skin: Positive for rash and wound.  Neurological: Negative for numbness.     Physical Exam Updated Vital Signs BP 110/66 (BP Location: Right Arm)   Pulse 76   Temp 98 F (36.7 C) (Oral)   Resp 18   Ht 5\' 7"  (1.702 m)   Wt 108.9 kg   LMP 04/06/2018   SpO2 98%   BMI 37.59 kg/m   Physical Exam  Constitutional: She appears well-developed and well-nourished. No distress.  HENT:  Head: Normocephalic.  Neck: Neck supple.  Cardiovascular: Normal rate.  Pulmonary/Chest: Effort normal. She has no wheezes.  Musculoskeletal: Normal range of motion. She exhibits no edema.  Skin: Rash noted.  Scattered small erythematous papules, some tiny pustules bilateral upper medial thighs.  Isolated 0.5 cm erythematous lesion, slightly raised and indurated right posterior calf.  No drainage, central punctum, no red streaking.     ED Treatments / Results  Labs (all labs ordered are listed, but only abnormal results are displayed) Labs Reviewed - No data to  display  EKG None  Radiology No results found.  Procedures Procedures (including critical care time)  Medications Ordered in ED Medications  neomycin-bacitracin-polymyxin (NEOSPORIN) ointment (has no administration in time range)     Initial Impression / Assessment and Plan / ED Course  I have reviewed the triage vital signs and the nursing notes.  Pertinent labs & imaging results that were available during my care of the patient were reviewed by me and considered in my medical decision making (see chart for details).     Pt with possible insect bite, no necrosis, no evidence for abscess of posterior calf.  Upper thigh rash with areas of tiny pustular lesions.  Will cover with abx.  May also be reaction to dyes/chemicals of the unwashed garment she wore ytd. Advised to wash the jeans before she wears again.  Prn f/uwtih pcp.   Final Clinical Impressions(s) / ED Diagnoses   Final diagnoses:  Bug bite with infection, initial encounter  Rash    ED Discharge Orders         Ordered    cephALEXin (KEFLEX) 500 MG capsule  4 times daily     05/07/18 1254           Burgess Amor, PA-C 05/07/18 1309    Mancel Bale, MD 05/07/18 1511

## 2018-05-07 NOTE — ED Triage Notes (Signed)
Patient complaining of rash to inside of right thigh x 2 weeks and possible insect bite to back of right lower leg since yesterday.

## 2018-06-12 ENCOUNTER — Encounter (HOSPITAL_COMMUNITY): Payer: Self-pay | Admitting: Emergency Medicine

## 2018-06-12 ENCOUNTER — Other Ambulatory Visit: Payer: Self-pay

## 2018-06-12 ENCOUNTER — Emergency Department (HOSPITAL_COMMUNITY)
Admission: EM | Admit: 2018-06-12 | Discharge: 2018-06-12 | Disposition: A | Discharge status: Home / Self Care | Payer: Self-pay | Attending: Emergency Medicine | Admitting: Emergency Medicine

## 2018-06-12 DIAGNOSIS — Z87891 Personal history of nicotine dependence: Secondary | ICD-10-CM | POA: Insufficient documentation

## 2018-06-12 DIAGNOSIS — L02415 Cutaneous abscess of right lower limb: Secondary | ICD-10-CM | POA: Insufficient documentation

## 2018-06-12 LAB — CBG MONITORING, ED: Glucose-Capillary: 70 mg/dL (ref 70–99)

## 2018-06-12 MED ORDER — DOXYCYCLINE HYCLATE 100 MG PO CAPS: 100.0000 mg | ORAL_CAPSULE | Freq: Two times a day (BID) | ORAL | 0 refills | Status: DC

## 2018-06-12 MED ORDER — LIDOCAINE-EPINEPHRINE (PF) 2 %-1:200000 IJ SOLN
10.0000 mL | Freq: Once | INTRAMUSCULAR | Status: AC
  Administered 2018-06-12: 10 mL
  Filled 2018-06-12: qty 10

## 2018-06-12 MED ORDER — DOXYCYCLINE HYCLATE 100 MG PO TABS
100.0000 mg | ORAL_TABLET | Freq: Once | ORAL | Status: AC
  Administered 2018-06-12: 100 mg via ORAL
  Filled 2018-06-12: qty 1

## 2018-06-12 MED ORDER — DOXYCYCLINE HYCLATE 100 MG PO TABS: 100.0000 mg | ORAL_TABLET | Freq: Once | ORAL | Status: DC

## 2018-06-12 NOTE — ED Notes (Signed)
Sprite provided to patient.

## 2018-06-12 NOTE — ED Triage Notes (Signed)
Pt reports abscess to back of right calf for one week. Pt denies history of same.

## 2018-06-12 NOTE — ED Notes (Signed)
Dressing applied to right calf at I and D site

## 2018-06-12 NOTE — Discharge Instructions (Signed)
Elevate leg.  Keep dressing on for 2 days.  Remove packing at that time.  Prescription for antibiotic.

## 2018-06-12 NOTE — ED Provider Notes (Signed)
[facility] Provider Note   CSN: [id number] Arrival date & time: 06/12/18  1257     History   Chief Complaint Chief Complaint  Patient presents with  . Abscess    HPI Stacy [name] is a 32 y.o. female.  Abscess on right calf for one week.  It is draining a small amount of purulent material.  No fevers, sweats, chills.  She is not diabetic.  Severity is mild.     Past Medical History:  Diagnosis Date  . Carpal tunnel syndrome   . Migraine   . Obesity     Patient Active Problem List   Diagnosis Date Noted  . HSV-2 (herpes simplex virus 2) infection 08/07/2015  . Precordial pain 05/31/2013  . Obesity   . Carpal tunnel syndrome     Past Surgical History:  Procedure Laterality Date  . CESAREAN SECTION    . CESAREAN SECTION    . CHOLECYSTECTOMY    . MOUTH SURGERY    . TUBAL LIGATION    . WISDOM TOOTH EXTRACTION       OB History    Gravida  2   Para  2   Term  2   Preterm      AB      Living  2     SAB      TAB      Ectopic      Multiple      Live Births               Home Medications    Prior to Admission medications   Medication Sig Start Date End Date Taking? Authorizing Provider  ibuprofen (ADVIL,MOTRIN) 200 MG tablet Take 200 mg by mouth every 6 (six) hours as needed for mild pain (takes 800 mg at a time).   Yes [provider]  cephALEXin (KEFLEX) 500 MG capsule Take 1 capsule (500 mg total) by mouth 4 (four) times daily. Patient not taking: Reported on 06/12/2018 05/07/18   Burgess AmorIdol, Julie, PA-C  doxycycline (VIBRAMYCIN) 100 MG capsule Take 1 capsule (100 mg total) by mouth 2 (two) times daily. 06/12/18   Donnetta Hutchingook, Laneka Mcgrory, MD  methocarbamol (ROBAXIN) 500 MG tablet Take 2 tablets (1,000 mg total) by mouth 4 (four) times daily as needed for muscle spasms (muscle spasm/pain). Patient not taking: Reported on 06/12/2018 09/23/17   Samuel JesterMcManus, Kathleen, DO  naproxen (NAPROSYN) 250 MG tablet Take 1 tablet (250 mg  total) by mouth 2 (two) times daily as needed for mild pain or moderate pain (take with food). Patient not taking: Reported on 06/12/2018 09/23/17   Samuel JesterMcManus, Kathleen, DO    Family History Family History  Problem Relation Age of Onset  . Diabetes Mother   . Hypertension Mother   . Hypertension Father     Social History Social History   Tobacco Use  . Smoking status: Former Smoker    Packs/day: 0.04    Years: 1.50    Pack years: 0.06    Types: Cigarettes    Last attempt to quit: 11/21/2010    Years since quitting: 7.5  . Smokeless tobacco: Never Used  Substance Use Topics  . Alcohol use: No    Alcohol/week: 0.0 standard drinks  . Drug use: No     Allergies   Coconut flavor and Pineapple   Review of Systems Review of Systems  All other systems reviewed and are negative.    Physical Exam Updated Vital Signs BP 103/69  Pulse (!) 51   Temp 98.3 F (36.8 C) (Oral)   Resp 19   Ht 5\' 7"  (1.702 m)   Wt 108.9 kg   LMP 05/11/2018   SpO2 100%   BMI 37.59 kg/m   Physical Exam Vitals signs and nursing note reviewed.  Constitutional:      Appearance: She is well-developed.  HENT:     Head: Normocephalic and atraumatic.  Eyes:     Conjunctiva/sclera: Conjunctivae normal.  Neck:     Musculoskeletal: Neck supple.  Musculoskeletal: Normal range of motion.  Skin:    Comments: Right calf: Abscess approximately 2.5 cm in diameter with erythematous ring.  Neurological:     Mental Status: She is alert and oriented to person, place, and time.  Psychiatric:        Behavior: Behavior normal.      ED Treatments / Results  Labs (all labs ordered are listed, but only abnormal results are displayed) Labs Reviewed  CBG MONITORING, ED    EKG None  Radiology No results found.  Procedures .Marland Kitchen.Incision and Drainage Date/Time: 06/12/2018 8:17 PM Performed by: Donnetta Hutchingook, Jaeven Wanzer, MD Authorized by: Donnetta Hutchingook, Solita Macadam, MD   Consent:    Consent obtained:  Verbal   Consent given  by:  Patient   Risks discussed:  Pain and infection Location:    Type:  Abscess   Size:  2.5 cm   Location:  Lower extremity   Lower extremity location:  Leg   Leg location:  R lower leg Pre-procedure details:    Skin preparation:  Betadine Anesthesia (see MAR for exact dosages):    Anesthesia method:  Local infiltration   Local anesthetic:  Lidocaine 2% WITH epi Procedure type:    Complexity:  Complex Procedure details:    Needle aspiration: no     Incision types:  Stab incision   Incision depth:  Subcutaneous   Scalpel blade:  11   Wound management:  Probed and deloculated and irrigated with saline   Drainage:  Purulent   Drainage amount:  Moderate   Wound treatment:  Drain placed   Packing materials:  1/4 in gauze Post-procedure details:    Patient tolerance of procedure:  Tolerated well, no immediate complications   (including critical care time)  Medications Ordered in ED Medications  lidocaine-EPINEPHrine (XYLOCAINE W/EPI) 2 %-1:200000 (PF) injection 10 mL (10 mLs Infiltration Given by Other 06/12/18 1642)  doxycycline (VIBRA-TABS) tablet 100 mg (100 mg Oral Given 06/12/18 1642)     Initial Impression / Assessment and Plan / ED Course  I have reviewed the triage vital signs and the nursing notes.  Pertinent labs & imaging results that were available during my care of the patient were reviewed by me and considered in my medical decision making (see chart for details).     Abscess to right calf.  Vision and drainage with moderate amount of pus expressed.  Discharge medication doxycycline 100 mg.  Final Clinical Impressions(s) / ED Diagnoses   Final diagnoses:  Abscess of right lower leg    ED Discharge Orders         Ordered    doxycycline (VIBRAMYCIN) 100 MG capsule  2 times daily     06/12/18 1653           Donnetta Hutchingook, Pamella Samons, MD 06/12/18 2019

## 2018-06-14 ENCOUNTER — Other Ambulatory Visit: Payer: Self-pay

## 2018-06-14 ENCOUNTER — Encounter (HOSPITAL_COMMUNITY): Payer: Self-pay | Admitting: *Deleted

## 2018-06-14 ENCOUNTER — Emergency Department (HOSPITAL_COMMUNITY)
Admission: EM | Admit: 2018-06-14 | Discharge: 2018-06-14 | Disposition: A | Discharge status: Home / Self Care | Payer: Medicaid Other | Attending: Emergency Medicine | Admitting: Emergency Medicine

## 2018-06-14 DIAGNOSIS — L03115 Cellulitis of right lower limb: Secondary | ICD-10-CM | POA: Insufficient documentation

## 2018-06-14 DIAGNOSIS — Z87891 Personal history of nicotine dependence: Secondary | ICD-10-CM | POA: Insufficient documentation

## 2018-06-14 LAB — COMPREHENSIVE METABOLIC PANEL
ALT: 19 U/L (ref 0–44)
ANION GAP: 4 — AB (ref 5–15)
AST: 16 U/L (ref 15–41)
Albumin: 3.9 g/dL (ref 3.5–5.0)
Alkaline Phosphatase: 40 U/L (ref 38–126)
BUN: 9 mg/dL (ref 6–20)
CHLORIDE: 109 mmol/L (ref 98–111)
CO2: 26 mmol/L (ref 22–32)
Calcium: 8.5 mg/dL — ABNORMAL LOW (ref 8.9–10.3)
Creatinine, Ser: 0.57 mg/dL (ref 0.44–1.00)
GFR calc Af Amer: >60 mL/min (ref >60)
GFR calc non Af Amer: >60 mL/min (ref >60)
Glucose, Bld: 93 mg/dL (ref 70–99)
Potassium: 3 mmol/L — ABNORMAL LOW (ref 3.5–5.1)
Sodium: 139 mmol/L (ref 135–145)
Total Bilirubin: 0.8 mg/dL (ref 0.3–1.2)
Total Protein: 7 g/dL (ref 6.5–8.1)

## 2018-06-14 LAB — CBC WITH DIFFERENTIAL/PLATELET
Abs Immature Granulocytes: 0.04 10*3/uL (ref 0.00–0.07)
Basophils Absolute: 0 10*3/uL (ref 0.0–0.1)
Basophils Relative: 0 %
EOS PCT: 2 %
Eosinophils Absolute: 0.2 10*3/uL (ref 0.0–0.5)
HCT: 35.8 % — ABNORMAL LOW (ref 36.0–46.0)
Hemoglobin: 11.5 g/dL — ABNORMAL LOW (ref 12.0–15.0)
Immature Granulocytes: 0 %
Lymphocytes Relative: 30 %
Lymphs Abs: 3.2 10*3/uL (ref 0.7–4.0)
MCH: 25.6 pg — ABNORMAL LOW (ref 26.0–34.0)
MCHC: 32.1 g/dL (ref 30.0–36.0)
MCV: 79.6 fL — ABNORMAL LOW (ref 80.0–100.0)
Monocytes Absolute: 0.6 10*3/uL (ref 0.1–1.0)
Monocytes Relative: 6 %
Neutro Abs: 6.5 10*3/uL (ref 1.7–7.7)
Neutrophils Relative %: 62 %
Platelets: 293 10*3/uL (ref 150–400)
RBC: 4.5 MIL/uL (ref 3.87–5.11)
RDW: 13.4 % (ref 11.5–15.5)
WBC: 10.5 10*3/uL (ref 4.0–10.5)
nRBC: 0 % (ref 0.0–0.2)

## 2018-06-14 MED ORDER — FLUCONAZOLE 150 MG PO TABS: 150.0000 mg | ORAL_TABLET | Freq: Once | ORAL | 0 refills | Status: AC

## 2018-06-14 MED ORDER — KETOROLAC TROMETHAMINE 30 MG/ML IJ SOLN
30.0000 mg | Freq: Once | INTRAMUSCULAR | Status: AC
  Administered 2018-06-14: 30 mg via INTRAVENOUS
  Filled 2018-06-14: qty 1

## 2018-06-14 MED ORDER — CLINDAMYCIN PHOSPHATE 900 MG/50ML IV SOLN
900.0000 mg | Freq: Once | INTRAVENOUS | Status: AC
  Administered 2018-06-14: 900 mg via INTRAVENOUS
  Filled 2018-06-14: qty 50

## 2018-06-14 NOTE — ED Triage Notes (Signed)
Pt was seen here yesterday and had an abscess to the back of her right leg lanced; pt states the pain has gotten progressively worse today and is unable to bear any weight on her right leg

## 2018-06-14 NOTE — Discharge Instructions (Addendum)
Elevate your leg, use heat on the area for comfort.  Take acetaminophen 1000 mg plus ibuprofen 600 mg every 6 hours as needed for pain.  Take your antibiotic, doxycycline every 12 hours (twice a day).  You should notice an improvement over the next 24 to 48 hours, return to the ED if you seem like you are getting worse, high fever, vomiting, worsening pain or swelling and be prepared to be admitted at that point.  Remove the packing later this evening while in the shower or in the tub or tomorrow morning.

## 2018-06-14 NOTE — ED Provider Notes (Addendum)
[facility] EMERGENCY DEPARTMENT Provider Note   CSN: [id number] Arrival date & time: 06/14/18  0039  Time seen 2:25 AM   History   Chief Complaint Chief Complaint  Patient presents with  . Abscess    HPI Stacy [name] is a 32 y.o. female.  HPI patient states she has never had a abscess or boil before.  She states she started out with a small bump on the back of her right calf about a week ago that got bigger.  She came to the ED on December 22 and she had a abscess drained.  She states the packing is still in place.  She states since then she has had increasing pain, swelling and chills but without fevers, however she has not checked her temperature.  She has had nausea without vomiting.  She was given a antibiotic pill in the ED on the 22nd and she states yesterday she only took 1 pill in the evening.  She thought it was only supposed to be once a day.  However I told her the instructions take twice a day.  States she is unable to walk because of pain.  PCP Donita Brooks, MD   Past Medical History:  Diagnosis Date  . Carpal tunnel syndrome   . Migraine   . Obesity     Patient Active Problem List   Diagnosis Date Noted  . HSV-2 (herpes simplex virus 2) infection 08/07/2015  . Precordial pain 05/31/2013  . Obesity   . Carpal tunnel syndrome     Past Surgical History:  Procedure Laterality Date  . CESAREAN SECTION    . CESAREAN SECTION    . CHOLECYSTECTOMY    . MOUTH SURGERY    . TUBAL LIGATION    . WISDOM TOOTH EXTRACTION       OB History    Gravida  2   Para  2   Term  2   Preterm      AB      Living  2     SAB      TAB      Ectopic      Multiple      Live Births               Home Medications    Normally none  Prior to Admission medications   Medication Sig Start Date End Date Taking? Authorizing Provider  doxycycline (VIBRAMYCIN) 100 MG capsule Take 1 capsule (100 mg total) by mouth 2 (two) times daily. 06/12/18   Donnetta Hutching, MD    Family History Family History  Problem Relation Age of Onset  . Diabetes Mother   . Hypertension Mother   . Hypertension Father     Social History Social History   Tobacco Use  . Smoking status: Former Smoker    Packs/day: 0.04    Years: 1.50    Pack years: 0.06    Types: Cigarettes    Last attempt to quit: 11/21/2010    Years since quitting: 7.5  . Smokeless tobacco: Never Used  Substance Use Topics  . Alcohol use: No    Alcohol/week: 0.0 standard drinks  . Drug use: No     Allergies   Coconut flavor and Pineapple   Review of Systems Review of Systems  All other systems reviewed and are negative.    Physical Exam Updated Vital Signs BP 130/86 (BP Location: Left Arm)   Pulse 77   Temp 97.8 F (36.6 C) (  Oral)   Resp 18   Ht 5\' 7"  (1.702 m)   Wt 108.8 kg   SpO2 100%   BMI 37.57 kg/m   Vital signs normal    Physical Exam Vitals signs and nursing note reviewed.  Constitutional:      Appearance: Normal appearance. She is obese.  HENT:     Head: Normocephalic and atraumatic.     Right Ear: External ear normal.     Left Ear: External ear normal.     Nose: Nose normal.  Eyes:     Extraocular Movements: Extraocular movements intact.     Conjunctiva/sclera: Conjunctivae normal.  Neck:     Musculoskeletal: Normal range of motion.  Cardiovascular:     Rate and Rhythm: Normal rate.  Pulmonary:     Effort: Respiratory distress present.     Breath sounds: Normal breath sounds.  Musculoskeletal:        General: Swelling and tenderness present.     Comments: Patient appears to have some localized swelling of her proximal right lower leg with redness of the skin and warmth to touch.  The packing is still in place where she had her I&D done on the 22nd.  She states the redness just started today.  Skin:    General: Skin is warm and dry.     Capillary Refill: Capillary refill takes less than 2 seconds.     Findings: Erythema present.    Neurological:     General: No focal deficit present.     Mental Status: She is alert and oriented to person, place, and time.  Psychiatric:        Mood and Affect: Mood normal.        Behavior: Behavior normal.        Thought Content: Thought content normal.          ED Treatments / Results  Labs (all labs ordered are listed, but only abnormal results are displayed) Results for orders placed or performed during the hospital encounter of 06/14/18  CBC with Differential  Result Value Ref Range   WBC 10.5 4.0 - 10.5 K/uL   RBC 4.50 3.87 - 5.11 MIL/uL   Hemoglobin 11.5 (L) 12.0 - 15.0 g/dL   HCT 78.235.8 (L) 95.636.0 - 21.346.0 %   MCV 79.6 (L) 80.0 - 100.0 fL   MCH 25.6 (L) 26.0 - 34.0 pg   MCHC 32.1 30.0 - 36.0 g/dL   RDW 08.613.4 57.811.5 - 46.915.5 %   Platelets 293 150 - 400 K/uL   nRBC 0.0 0.0 - 0.2 %   Neutrophils Relative % 62 %   Neutro Abs 6.5 1.7 - 7.7 K/uL   Lymphocytes Relative 30 %   Lymphs Abs 3.2 0.7 - 4.0 K/uL   Monocytes Relative 6 %   Monocytes Absolute 0.6 0.1 - 1.0 K/uL   Eosinophils Relative 2 %   Eosinophils Absolute 0.2 0.0 - 0.5 K/uL   Basophils Relative 0 %   Basophils Absolute 0.0 0.0 - 0.1 K/uL   Immature Granulocytes 0 %   Abs Immature Granulocytes 0.04 0.00 - 0.07 K/uL  Comprehensive metabolic panel  Result Value Ref Range   Sodium 139 135 - 145 mmol/L   Potassium 3.0 (L) 3.5 - 5.1 mmol/L   Chloride 109 98 - 111 mmol/L   CO2 26 22 - 32 mmol/L   Glucose, Bld 93 70 - 99 mg/dL   BUN 9 6 - 20 mg/dL   Creatinine, Ser 6.290.57 0.44 - 1.00  mg/dL   Calcium 8.5 (L) 8.9 - 10.3 mg/dL   Total Protein 7.0 6.5 - 8.1 g/dL   Albumin 3.9 3.5 - 5.0 g/dL   AST 16 15 - 41 U/L   ALT 19 0 - 44 U/L   Alkaline Phosphatase 40 38 - 126 U/L   Total Bilirubin 0.8 0.3 - 1.2 mg/dL   GFR calc non Af Amer >60 >60 mL/min   GFR calc Af Amer >60 >60 mL/min   Anion gap 4 (L) 5 - 15   Laboratory interpretation all normal except leukocytosis, mild anemia,  hypokalemia    EKG None  Radiology No results found.  Procedures Procedures (including critical care time)  Medications Ordered in ED Medications  ketorolac (TORADOL) 30 MG/ML injection 30 mg (30 mg Intravenous Given 06/14/18 0300)  clindamycin (CLEOCIN) IVPB 900 mg (0 mg Intravenous Stopped 06/14/18 0332)     Initial Impression / Assessment and Plan / ED Course  I have reviewed the triage vital signs and the nursing notes.  Pertinent labs & imaging results that were available during my care of the patient were reviewed by me and considered in my medical decision making (see chart for details).     Laboratory testing was done and patient had an IV inserted and was given clindamycin 900 mg IV.  Recheck at 4 AM patient has finished her antibiotics, we discussed her blood test results.  I gave her the option of admission for IV antibiotics or taking her oral antibiotics twice a day and she wants to try that.  She was advised to remove the packing later today while in the shower in the tub.  She should return if she gets fever, the swelling and pain get worse and be prepared to be admitted if that happens.  Patient is supposed to work later today and then the next time will be Saturday, the 28th.  She was given a work note.  She also asked for Diflucan in case she gets a yeast infection with the antibiotics.  Final Clinical Impressions(s) / ED Diagnoses   Final diagnoses:  Cellulitis of right lower extremity    ED Discharge Orders         Ordered    fluconazole (DIFLUCAN) 150 MG tablet   Once     06/14/18 0427        OTC ibuprofen and acetaminophen  Plan discharge  Devoria AlbeIva Arienne Gartin, MD, Concha PyoFACEP    Solan Vosler, MD 06/14/18 Janith [id number]    Devoria AlbeKnapp, Jehan Ranganathan, MD 06/14/18 [phone]

## 2018-06-16 ENCOUNTER — Encounter (HOSPITAL_COMMUNITY): Payer: Self-pay

## 2018-06-16 ENCOUNTER — Other Ambulatory Visit: Payer: Self-pay

## 2018-06-16 ENCOUNTER — Emergency Department (HOSPITAL_COMMUNITY)
Admission: EM | Admit: 2018-06-16 | Discharge: 2018-06-16 | Disposition: A | Discharge status: Home / Self Care | Payer: Medicaid Other | Attending: Emergency Medicine | Admitting: Emergency Medicine

## 2018-06-16 DIAGNOSIS — T364X5A Adverse effect of tetracyclines, initial encounter: Secondary | ICD-10-CM | POA: Insufficient documentation

## 2018-06-16 DIAGNOSIS — R112 Nausea with vomiting, unspecified: Secondary | ICD-10-CM | POA: Insufficient documentation

## 2018-06-16 DIAGNOSIS — T50905A Adverse effect of unspecified drugs, medicaments and biological substances, initial encounter: Secondary | ICD-10-CM

## 2018-06-16 LAB — COMPREHENSIVE METABOLIC PANEL
ALT: 29 U/L (ref 0–44)
AST: 29 U/L (ref 15–41)
Albumin: 3.5 g/dL (ref 3.5–5.0)
Alkaline Phosphatase: 38 U/L (ref 38–126)
Anion gap: 5 (ref 5–15)
BUN: 9 mg/dL (ref 6–20)
CO2: 25 mmol/L (ref 22–32)
Calcium: 8.5 mg/dL — ABNORMAL LOW (ref 8.9–10.3)
Chloride: 110 mmol/L (ref 98–111)
Creatinine, Ser: 0.6 mg/dL (ref 0.44–1.00)
GFR calc Af Amer: >60 mL/min (ref >60)
GFR calc non Af Amer: >60 mL/min (ref >60)
Glucose, Bld: 94 mg/dL (ref 70–99)
POTASSIUM: 3.7 mmol/L (ref 3.5–5.1)
Sodium: 140 mmol/L (ref 135–145)
Total Bilirubin: 0.7 mg/dL (ref 0.3–1.2)
Total Protein: 6.8 g/dL (ref 6.5–8.1)

## 2018-06-16 LAB — URINALYSIS, ROUTINE W REFLEX MICROSCOPIC
Bilirubin Urine: NEGATIVE
Glucose, UA: NEGATIVE mg/dL
Hgb urine dipstick: NEGATIVE
Ketones, ur: NEGATIVE mg/dL
Leukocytes, UA: NEGATIVE
Nitrite: NEGATIVE
Protein, ur: NEGATIVE mg/dL
Specific Gravity, Urine: 1.026 (ref 1.005–1.030)
pH: 5 (ref 5.0–8.0)

## 2018-06-16 LAB — CBC
HCT: 36.9 % (ref 36.0–46.0)
Hemoglobin: 11.9 g/dL — ABNORMAL LOW (ref 12.0–15.0)
MCH: 26.1 pg (ref 26.0–34.0)
MCHC: 32.2 g/dL (ref 30.0–36.0)
MCV: 80.9 fL (ref 80.0–100.0)
Platelets: 296 10*3/uL (ref 150–400)
RBC: 4.56 MIL/uL (ref 3.87–5.11)
RDW: 13.2 % (ref 11.5–15.5)
WBC: 7.2 10*3/uL (ref 4.0–10.5)
nRBC: 0 % (ref 0.0–0.2)

## 2018-06-16 LAB — PREGNANCY, URINE: Preg Test, Ur: NEGATIVE

## 2018-06-16 LAB — LIPASE, BLOOD: Lipase: 24 U/L (ref 11–51)

## 2018-06-16 MED ORDER — CLINDAMYCIN HCL 150 MG PO CAPS: 300.0000 mg | ORAL_CAPSULE | Freq: Three times a day (TID) | ORAL | 0 refills | Status: DC

## 2018-06-16 MED ORDER — ONDANSETRON 4 MG PO TBDP
4.0000 mg | ORAL_TABLET | Freq: Once | ORAL | Status: AC
  Administered 2018-06-16: 4 mg via ORAL
  Filled 2018-06-16: qty 1

## 2018-06-16 MED ORDER — TRAMADOL HCL 50 MG PO TABS: 50.0000 mg | ORAL_TABLET | Freq: Four times a day (QID) | ORAL | 0 refills | Status: DC | PRN

## 2018-06-16 MED ORDER — PROMETHAZINE HCL 25 MG PO TABS: 25.0000 mg | ORAL_TABLET | Freq: Four times a day (QID) | ORAL | 0 refills | Status: DC | PRN

## 2018-06-16 MED ORDER — CLINDAMYCIN HCL 150 MG PO CAPS
300.0000 mg | ORAL_CAPSULE | Freq: Once | ORAL | Status: AC
  Administered 2018-06-16: 300 mg via ORAL
  Filled 2018-06-16: qty 2

## 2018-06-16 NOTE — ED Notes (Signed)
EDP at bedside  

## 2018-06-16 NOTE — ED Notes (Signed)
Pt reports improvement in nausea. Tolerating PO fluids at this time. Pt given saltine crackers.

## 2018-06-16 NOTE — Discharge Instructions (Addendum)
As discussed, stop taking your doxycycline, in its place, start taking the clindamycin prescribed, taking your next dose tomorrow morning (as you received this evenings dose here).  You may use the phenergan if needed for nausea or vomiting (but hopefully you will not need this medicine, since I suspect this was a side effect from the doxycycline).  You may take the tramadol prescribed for pain relief.  This will make you drowsy - do not drive within 4 hours of taking this medication.  Increase your warm water soaks to twice daily for 15 minutes each along with gentle massage around the infected site to encourage any additional drainage and healing. Get rechecked here if the surrounding redness starts to return, however, it looks improved today compared to the photos from your visit here 2 days ago.

## 2018-06-16 NOTE — ED Notes (Signed)
EDP at bedside updating patient. 

## 2018-06-16 NOTE — ED Triage Notes (Signed)
Pt was seen for an abscess on her leg on 12/23. Pt states she was started on abx and every time she takes the abx she throws up. Pt states she has thrown up twice today. Pt denies diarrhea. Pt c/o abdominal pain which hurts "when I throw up"

## 2018-06-16 NOTE — ED Provider Notes (Signed)
[facility] EMERGENCY DEPARTMENT Provider Note   CSN: [id number] Arrival date & time: 06/16/18  1248     History   Chief Complaint Chief Complaint  Patient presents with  . Abdominal Pain  . Emesis    HPI Morrison OldGina L Imperato is a 32 y.o. female currently being treated for a cellulitis and abscess of her left posterior calf with doxycycline presenting with nausea and vomiting which she suspects is a side effect of this antibiotic.  She is been unable to keep the medication down since yesterday morning, has been able to tolerate fluid intake and small meals, but has been unable to tolerate the antibiotic itself.  She denies specific abdominal pain, but endorses nausea, no diarrhea or bowel changes.  Also no fevers or chills.  Her abscess is slowly improving, she reports continued tenderness but is improved along with lessening of the surrounding redness that was present 2 days ago.  She is doing a warm water soak daily of the abscess site.  He has found no alleviators for her symptoms.    The history is provided by the patient.    Past Medical History:  Diagnosis Date  . Carpal tunnel syndrome   . Migraine   . Obesity     Patient Active Problem List   Diagnosis Date Noted  . HSV-2 (herpes simplex virus 2) infection 08/07/2015  . Precordial pain 05/31/2013  . Obesity   . Carpal tunnel syndrome     Past Surgical History:  Procedure Laterality Date  . CESAREAN SECTION    . CESAREAN SECTION    . CHOLECYSTECTOMY    . MOUTH SURGERY    . TUBAL LIGATION    . WISDOM TOOTH EXTRACTION       OB History    Gravida  2   Para  2   Term  2   Preterm      AB      Living  2     SAB      TAB      Ectopic      Multiple      Live Births               Home Medications    Prior to Admission medications   Medication Sig Start Date End Date Taking? Authorizing Provider  cephALEXin (KEFLEX) 500 MG capsule Take 1 capsule (500 mg total) by mouth 4 (four) times  daily. Patient not taking: Reported on 06/12/2018 05/07/18   Burgess AmorIdol, Crimson Beer, PA-C  clindamycin (CLEOCIN) 150 MG capsule Take 2 capsules (300 mg total) by mouth 3 (three) times daily. 06/16/18   Burgess AmorIdol, Pasha Broad, PA-C  ibuprofen (ADVIL,MOTRIN) 200 MG tablet Take 200 mg by mouth every 6 (six) hours as needed for mild pain (takes 800 mg at a time).    [provider]  methocarbamol (ROBAXIN) 500 MG tablet Take 2 tablets (1,000 mg total) by mouth 4 (four) times daily as needed for muscle spasms (muscle spasm/pain). Patient not taking: Reported on 06/12/2018 09/23/17   Samuel JesterMcManus, Kathleen, DO  naproxen (NAPROSYN) 250 MG tablet Take 1 tablet (250 mg total) by mouth 2 (two) times daily as needed for mild pain or moderate pain (take with food). Patient not taking: Reported on 06/12/2018 09/23/17   Samuel JesterMcManus, Kathleen, DO  promethazine (PHENERGAN) 25 MG tablet Take 1 tablet (25 mg total) by mouth every 6 (six) hours as needed for nausea or vomiting. 06/16/18   Burgess AmorIdol, Renesmae Donahey, PA-C  traMADol (ULTRAM) 50 MG tablet  Take 1 tablet (50 mg total) by mouth every 6 (six) hours as needed. 06/16/18   Burgess AmorIdol, Danaye Sobh, PA-C    Family History Family History  Problem Relation Age of Onset  . Diabetes Mother   . Hypertension Mother   . Hypertension Father     Social History Social History   Tobacco Use  . Smoking status: Former Smoker    Packs/day: 0.04    Years: 1.50    Pack years: 0.06    Types: Cigarettes    Last attempt to quit: 11/21/2010    Years since quitting: 7.5  . Smokeless tobacco: Never Used  Substance Use Topics  . Alcohol use: No    Alcohol/week: 0.0 standard drinks  . Drug use: No     Allergies   Coconut flavor and Pineapple   Review of Systems Review of Systems  Constitutional: Negative for chills and fever.  HENT: Negative for congestion and sore throat.   Eyes: Negative.   Respiratory: Negative for chest tightness and shortness of breath.   Cardiovascular: Negative for chest pain.   Gastrointestinal: Positive for nausea and vomiting. Negative for abdominal pain, constipation and diarrhea.  Genitourinary: Negative.   Musculoskeletal: Negative for arthralgias, joint swelling and neck pain.  Skin: Positive for wound. Negative for rash.  Neurological: Negative for dizziness, weakness, light-headedness, numbness and headaches.  Psychiatric/Behavioral: Negative.      Physical Exam Updated Vital Signs BP 122/77 (BP Location: Right Arm)   Pulse 63   Temp 98.3 F (36.8 C) (Oral)   Resp 16   Ht 5\' 7"  (1.702 m)   Wt 108.9 kg   LMP 05/16/2018   SpO2 100%   BMI 37.59 kg/m   Physical Exam Vitals signs and nursing note reviewed.  Constitutional:      Appearance: She is well-developed.  HENT:     Head: Normocephalic and atraumatic.     Mouth/Throat:     Mouth: Mucous membranes are moist.  Eyes:     Conjunctiva/sclera: Conjunctivae normal.  Neck:     Musculoskeletal: Normal range of motion.  Cardiovascular:     Rate and Rhythm: Normal rate and regular rhythm.     Heart sounds: Normal heart sounds.  Pulmonary:     Effort: Pulmonary effort is normal.     Breath sounds: Normal breath sounds. No wheezing.  Abdominal:     General: Bowel sounds are normal.     Palpations: Abdomen is soft.     Tenderness: There is no abdominal tenderness. There is no guarding or rebound.  Musculoskeletal: Normal range of motion.  Skin:    General: Skin is warm and dry.     Comments: Well healing I&D site left posterior calf.  There is no surrounding remaining cellulitis.  Today's exam was compared to images from her chart from her visit on 12/24.  No induration or fluctuance.  Neurological:     Mental Status: She is alert.      ED Treatments / Results  Labs (all labs ordered are listed, but only abnormal results are displayed) Labs Reviewed  COMPREHENSIVE METABOLIC PANEL - Abnormal; Notable for the following components:      Result Value   Calcium 8.5 (*)    All other  components within normal limits  CBC - Abnormal; Notable for the following components:   Hemoglobin 11.9 (*)    All other components within normal limits  URINALYSIS, ROUTINE W REFLEX MICROSCOPIC - Abnormal; Notable for the following components:   APPearance HAZY (*)  All other components within normal limits  LIPASE, BLOOD  PREGNANCY, URINE    EKG None  Radiology No results found.  Procedures Procedures (including critical care time)  Medications Ordered in ED Medications  ondansetron (ZOFRAN-ODT) disintegrating tablet 4 mg (4 mg Oral Given 06/16/18 1705)  clindamycin (CLEOCIN) capsule 300 mg (300 mg Oral Given 06/16/18 1737)     Initial Impression / Assessment and Plan / ED Course  I have reviewed the triage vital signs and the nursing notes.  Pertinent labs & imaging results that were available during my care of the patient were reviewed by me and considered in my medical decision making (see chart for details).     Labs were reviewed and discussed with patient.  Her abdominal exam is benign, there is no findings suggesting need for imaging.  I suspect her symptoms are side effects from the doxycycline.  She was given a dose of Zofran here as she is driving herself home and would not be able to tolerate Phenergan, she was then given a p.o. dose of clindamycin which she tolerated well, tolerated p.o. challenge along with a snack.  She was advised to DC the doxycycline, 7-day course of clindamycin was prescribed along with Phenergan.  She was also encouraged to increase her warm water soaks to twice daily.  Her cellulitis has resolved, there is no exam findings to suggest persistent pus pocket or need for further I&D.  Plan recheck here for any worsening symptoms, otherwise expect PRN follow-up.  Final Clinical Impressions(s) / ED Diagnoses   Final diagnoses:  Medication side effect, initial encounter  Non-intractable vomiting with nausea, unspecified vomiting type    ED  Discharge Orders         Ordered    clindamycin (CLEOCIN) 150 MG capsule  3 times daily     06/16/18 1844    promethazine (PHENERGAN) 25 MG tablet  Every 6 hours PRN     06/16/18 1844    traMADol (ULTRAM) 50 MG tablet  Every 6 hours PRN     06/16/18 1844           Burgess Amor, PA-C 06/16/18 1859    Bethann Berkshire, MD 06/16/18 2254

## 2018-06-24 ENCOUNTER — Emergency Department (HOSPITAL_COMMUNITY): Payer: No Typology Code available for payment source

## 2018-06-24 ENCOUNTER — Other Ambulatory Visit: Payer: Self-pay

## 2018-06-24 ENCOUNTER — Emergency Department (HOSPITAL_COMMUNITY)
Admission: EM | Admit: 2018-06-24 | Discharge: 2018-06-24 | Disposition: A | Discharge status: Home / Self Care | Payer: No Typology Code available for payment source | Attending: Emergency Medicine | Admitting: Emergency Medicine

## 2018-06-24 ENCOUNTER — Encounter (HOSPITAL_COMMUNITY): Payer: Self-pay | Admitting: Emergency Medicine

## 2018-06-24 DIAGNOSIS — Z79899 Other long term (current) drug therapy: Secondary | ICD-10-CM | POA: Diagnosis not present

## 2018-06-24 DIAGNOSIS — Y9389 Activity, other specified: Secondary | ICD-10-CM | POA: Diagnosis not present

## 2018-06-24 DIAGNOSIS — Y92410 Unspecified street and highway as the place of occurrence of the external cause: Secondary | ICD-10-CM | POA: Insufficient documentation

## 2018-06-24 DIAGNOSIS — Z87891 Personal history of nicotine dependence: Secondary | ICD-10-CM | POA: Diagnosis not present

## 2018-06-24 DIAGNOSIS — S39012A Strain of muscle, fascia and tendon of lower back, initial encounter: Secondary | ICD-10-CM | POA: Insufficient documentation

## 2018-06-24 DIAGNOSIS — Y999 Unspecified external cause status: Secondary | ICD-10-CM | POA: Insufficient documentation

## 2018-06-24 DIAGNOSIS — S3992XA Unspecified injury of lower back, initial encounter: Secondary | ICD-10-CM | POA: Diagnosis present

## 2018-06-24 MED ORDER — CYCLOBENZAPRINE HCL 10 MG PO TABS: 10.0000 mg | ORAL_TABLET | Freq: Three times a day (TID) | ORAL | 0 refills | Status: DC | PRN
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
## 2018-06-24 NOTE — Discharge Instructions (Addendum)
Apply ice packs on and off to your lower back.  Ibuprofen every 6-8 hours if needed for pain.  Follow-up with your primary doctor for recheck return to ER for any worsening symptoms.

## 2019-02-21 ENCOUNTER — Telehealth: Payer: Self-pay | Admitting: Family Medicine

## 2019-02-21 NOTE — Telephone Encounter (Signed)
Pt called and states that she needs a refill on her Flagyl.   Ok to refill?   LOV: 08/24/17

## 2019-02-23 ENCOUNTER — Other Ambulatory Visit: Payer: Self-pay | Admitting: Family Medicine

## 2019-02-23 DIAGNOSIS — B009 Herpesviral infection, unspecified: Secondary | ICD-10-CM

## 2019-02-23 MED ORDER — METRONIDAZOLE 500 MG PO TABS: 500.0000 mg | ORAL_TABLET | Freq: Two times a day (BID) | ORAL | 0 refills | Status: DC

## 2019-02-23 NOTE — Telephone Encounter (Signed)
I refilled

## 2019-02-23 NOTE — Telephone Encounter (Signed)
Spoke with patient and informed her that medication was sent to the pharmacy. Patient verbalized understanding.

## 2019-08-08 ENCOUNTER — Ambulatory Visit: Payer: Medicaid Other | Attending: Internal Medicine

## 2019-08-08 ENCOUNTER — Other Ambulatory Visit: Payer: Self-pay

## 2019-08-08 DIAGNOSIS — Z20822 Contact with and (suspected) exposure to covid-19: Secondary | ICD-10-CM

## 2019-08-09 LAB — NOVEL CORONAVIRUS, NAA: SARS-CoV-2, NAA: NOT DETECTED

## 2019-08-10 ENCOUNTER — Other Ambulatory Visit: Payer: Self-pay | Admitting: Family Medicine

## 2019-08-15 ENCOUNTER — Ambulatory Visit: Payer: Medicaid Other | Attending: Internal Medicine

## 2019-08-15 ENCOUNTER — Other Ambulatory Visit: Payer: Self-pay

## 2019-08-15 DIAGNOSIS — Z20822 Contact with and (suspected) exposure to covid-19: Secondary | ICD-10-CM

## 2019-08-16 LAB — NOVEL CORONAVIRUS, NAA: SARS-CoV-2, NAA: NOT DETECTED

## 2019-08-26 ENCOUNTER — Other Ambulatory Visit: Payer: Self-pay

## 2019-08-26 ENCOUNTER — Ambulatory Visit (INDEPENDENT_AMBULATORY_CARE_PROVIDER_SITE_OTHER): Payer: Self-pay

## 2019-08-26 ENCOUNTER — Ambulatory Visit (HOSPITAL_COMMUNITY)
Admission: EM | Admit: 2019-08-26 | Discharge: 2019-08-26 | Disposition: A | Discharge status: Home / Self Care | Payer: Medicaid Other | Attending: Emergency Medicine | Admitting: Emergency Medicine

## 2019-08-26 DIAGNOSIS — S63612A Unspecified sprain of right middle finger, initial encounter: Secondary | ICD-10-CM

## 2019-08-26 DIAGNOSIS — W19XXXA Unspecified fall, initial encounter: Secondary | ICD-10-CM

## 2019-08-26 MED ORDER — NAPROXEN 500 MG PO TABS: 500.0000 mg | ORAL_TABLET | Freq: Two times a day (BID) | ORAL | 0 refills | Status: DC

## 2019-08-26 NOTE — ED Provider Notes (Signed)
[facility]    CSN: [id number] Arrival date & time: 08/26/19  1245      History   Chief Complaint Chief Complaint  Patient presents with  . Hand Injury    HPI Stacy [name] is a 34 y.o. female.   Clemon Chambers presents with complaints of right hand pain after a fall injury today. She stood up from bending over, lost her balance and fell back onto her right side, catching herself with her right hand on the cement. This occurred at approximately 1130 this am. Pain since. Pain is primarily to middle finger. She is right handed. No numbness or tingling but with sensation of "needles" to her finger. Pain with flexion and extension of middle finger. Denies any previous hand or finger injury or surgery. Hasn't taken any medications for pain.    ROS per HPI, negative if not otherwise mentioned.      Past Medical History:  Diagnosis Date  . Carpal tunnel syndrome   . Migraine   . Obesity     Patient Active Problem List   Diagnosis Date Noted  . HSV-2 (herpes simplex virus 2) infection 08/07/2015  . Precordial pain 05/31/2013  . Obesity   . Carpal tunnel syndrome     Past Surgical History:  Procedure Laterality Date  . CESAREAN SECTION    . CESAREAN SECTION    . CHOLECYSTECTOMY    . MOUTH SURGERY    . TUBAL LIGATION    . WISDOM TOOTH EXTRACTION      OB History    Gravida  2   Para  2   Term  2   Preterm      AB      Living  2     SAB      TAB      Ectopic      Multiple      Live Births               Home Medications    Prior to Admission medications   Medication Sig Start Date End Date Taking? Authorizing Provider  acyclovir (ZOVIRAX) 400 MG tablet TAKE 1 TABLET (400 MG TOTAL) BY MOUTH 3 (THREE) TIMES DAILY. 02/23/19   Susy Frizzle, MD  clindamycin (CLEOCIN) 150 MG capsule Take 2 capsules (300 mg total) by mouth 3 (three) times daily. 06/16/18   Evalee Jefferson, PA-C  cyclobenzaprine (FLEXERIL) 10 MG tablet Take 1 tablet (10 mg  total) by mouth 3 (three) times daily as needed. 06/24/18   Triplett, Tammy, PA-C  ibuprofen (ADVIL,MOTRIN) 200 MG tablet Take 200 mg by mouth every 6 (six) hours as needed for mild pain (takes 800 mg at a time).    [provider]  methocarbamol (ROBAXIN) 500 MG tablet Take 2 tablets (1,000 mg total) by mouth 4 (four) times daily as needed for muscle spasms (muscle spasm/pain). Patient not taking: Reported on 06/12/2018 09/23/17   Francine Graven, DO  metroNIDAZOLE (FLAGYL) 500 MG tablet Take 1 tablet (500 mg total) by mouth 2 (two) times daily. 02/23/19   Susy Frizzle, MD  naproxen (NAPROSYN) 500 MG tablet Take 1 tablet (500 mg total) by mouth 2 (two) times daily. 08/26/19   Zigmund Gottron, NP  promethazine (PHENERGAN) 25 MG tablet Take 1 tablet (25 mg total) by mouth every 6 (six) hours as needed for nausea or vomiting. 06/16/18   Evalee Jefferson, PA-C  traMADol (ULTRAM) 50 MG tablet Take 1 tablet (50 mg total)  by mouth every 6 (six) hours as needed. 06/16/18   Burgess Amor, PA-C    Family History Family History  Problem Relation Age of Onset  . Diabetes Mother   . Hypertension Mother   . Hypertension Father     Social History Social History   Tobacco Use  . Smoking status: Former Smoker    Packs/day: 0.04    Years: 1.50    Pack years: 0.06    Types: Cigarettes    Quit date: 11/21/2010    Years since quitting: 8.7  . Smokeless tobacco: Never Used  Substance Use Topics  . Alcohol use: No    Alcohol/week: 0.0 standard drinks  . Drug use: No     Allergies   Coconut flavor and Pineapple   Review of Systems Review of Systems   Physical Exam Triage Vital Signs ED Triage Vitals  Enc Vitals Group     BP 08/26/19 1324 126/81     Pulse Rate 08/26/19 1324 65     Resp 08/26/19 1324 18     Temp 08/26/19 1324 98.3 F (36.8 C)     Temp Source 08/26/19 1324 Oral     SpO2 08/26/19 1324 100 %     Weight --      Height --      Head Circumference --      Peak Flow --       Pain Score 08/26/19 1325 8     Pain Loc --      Pain Edu? --      Excl. in GC? --    No data found.  Updated Vital Signs BP 126/81 (BP Location: Left Arm)   Pulse 65   Temp 98.3 F (36.8 C) (Oral)   Resp 18   LMP 08/07/2019   SpO2 100%    Physical Exam Constitutional:      General: She is not in acute distress.    Appearance: She is well-developed.  Cardiovascular:     Rate and Rhythm: Normal rate.  Pulmonary:     Effort: Pulmonary effort is normal.  Musculoskeletal:     Right wrist: Normal.     Right hand: Swelling, tenderness and bony tenderness present. No deformity or lacerations. Decreased range of motion. Normal strength. Normal sensation. Normal pulse.     Comments: Right hand middle finger with pain started at MCP joint, worse at PIP joint and to middle phalanx with mild swelling noted; pain with flexion and extension; no pain with thumb to finger opposition; cap refill < 2 seconds ; sensation intact; no bruising visible   Skin:    General: Skin is warm and dry.  Neurological:     Mental Status: She is alert and oriented to person, place, and time.      UC Treatments / Results  Labs (all labs ordered are listed, but only abnormal results are displayed) Labs Reviewed - No data to display  EKG   Radiology DG Hand Complete Right  Result Date: 08/26/2019 CLINICAL DATA:  Right hand pain after fall x today. States pain is at the tip of the 3th digit. EXAM: RIGHT HAND - COMPLETE 3+ VIEW COMPARISON:  None. FINDINGS: There is no evidence of fracture or dislocation. There is no evidence of arthropathy or other focal bone abnormality. Soft tissues are unremarkable. IMPRESSION: Negative radiographs of the right hand. Electronically Signed   By: Emmaline Kluver M.D.   On: 08/26/2019 13:43    Procedures Procedures (including critical care time)  Medications Ordered in UC Medications - No data to display  Initial Impression / Assessment and Plan / UC Course  I  have reviewed the triage vital signs and the nursing notes.  Pertinent labs & imaging results that were available during my care of the patient were reviewed by me and considered in my medical decision making (see chart for details).     Contusion and strain to right hand middle finger s/p fall today. Xray without acute findings. Pain management discussed. Follow up recommendations provided. Patient verbalized understanding and agreeable to plan.   Final Clinical Impressions(s) / UC Diagnoses   Final diagnoses:  Sprain of right middle finger, unspecified site of digit, initial encounter     Discharge Instructions     Your xray is normal today, nothing is broken or dislocated.  Consistent with sprain to your middle finger.  Ice, elevation, naproxen twice a day to help with pain.  Activity as tolerated.  Follow up with sports medicine or orthopedics as needed for any persistent symptoms.    ED Prescriptions    Medication Sig Dispense Auth. Provider   naproxen (NAPROSYN) 500 MG tablet Take 1 tablet (500 mg total) by mouth 2 (two) times daily. 30 tablet Georgetta Haber, NP     PDMP not reviewed this encounter.   Georgetta Haber, NP 08/26/19 1439

## 2019-08-26 NOTE — Discharge Instructions (Signed)
Your xray is normal today, nothing is broken or dislocated.  Consistent with sprain to your middle finger.  Ice, elevation, naproxen twice a day to help with pain.  Activity as tolerated.  Follow up with sports medicine or orthopedics as needed for any persistent symptoms.
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
## 2019-09-26 ENCOUNTER — Emergency Department (HOSPITAL_COMMUNITY): Payer: Self-pay

## 2019-09-26 ENCOUNTER — Emergency Department (HOSPITAL_COMMUNITY)
Admission: EM | Admit: 2019-09-26 | Discharge: 2019-09-26 | Disposition: A | Discharge status: Home / Self Care | Payer: Self-pay | Attending: Emergency Medicine | Admitting: Emergency Medicine

## 2019-09-26 ENCOUNTER — Encounter (HOSPITAL_COMMUNITY): Payer: Self-pay | Admitting: Emergency Medicine

## 2019-09-26 ENCOUNTER — Other Ambulatory Visit: Payer: Self-pay

## 2019-09-26 DIAGNOSIS — Z87891 Personal history of nicotine dependence: Secondary | ICD-10-CM | POA: Insufficient documentation

## 2019-09-26 DIAGNOSIS — W010XXA Fall on same level from slipping, tripping and stumbling without subsequent striking against object, initial encounter: Secondary | ICD-10-CM | POA: Insufficient documentation

## 2019-09-26 DIAGNOSIS — Y9301 Activity, walking, marching and hiking: Secondary | ICD-10-CM | POA: Insufficient documentation

## 2019-09-26 DIAGNOSIS — Y92832 Beach as the place of occurrence of the external cause: Secondary | ICD-10-CM | POA: Insufficient documentation

## 2019-09-26 DIAGNOSIS — Y999 Unspecified external cause status: Secondary | ICD-10-CM | POA: Insufficient documentation

## 2019-09-26 DIAGNOSIS — S93402A Sprain of unspecified ligament of left ankle, initial encounter: Secondary | ICD-10-CM | POA: Insufficient documentation

## 2019-09-26 MED ORDER — NAPROXEN 500 MG PO TABS: 500.0000 mg | ORAL_TABLET | Freq: Two times a day (BID) | ORAL | 0 refills | Status: AC

## 2019-09-26 MED ORDER — KETOROLAC TROMETHAMINE 60 MG/2ML IM SOLN
60.0000 mg | Freq: Once | INTRAMUSCULAR | Status: AC
  Administered 2019-09-26: 60 mg via INTRAMUSCULAR
  Filled 2019-09-26: qty 2

## 2019-09-26 NOTE — ED Triage Notes (Signed)
[place]. Swelling noted.

## 2019-09-26 NOTE — ED Provider Notes (Addendum)
[facility] EMERGENCY DEPARTMENT Provider Note   CSN: [id number] Arrival date & time: 09/26/19  1307     History Chief Complaint  Patient presents with  . Ankle Pain    Stacy [name] is a 34 y.o. female.  Patient is a 34 year old female with no contributory past medical history presenting to emergency department for left ankle injury.  Patient reports that this past Saturday she [place] when she tripped and injured her ankle.  She is unsure the mechanism of the injury.  She did not hit her head or have any other additional injuries.  She has been ambulatory but with pain.  Reports the most of her pain is over the anterior aspect of her ankle.  Reports a little bit of tingling but no numbness.  Reports she heard and felt a pop at the same time.  Has not tried anything for relief.        Past Medical History:  Diagnosis Date  . Carpal tunnel syndrome   . Migraine   . Obesity     Patient Active Problem List   Diagnosis Date Noted  . HSV-2 (herpes simplex virus 2) infection 08/07/2015  . Precordial pain 05/31/2013  . Obesity   . Carpal tunnel syndrome     Past Surgical History:  Procedure Laterality Date  . CESAREAN SECTION    . CESAREAN SECTION    . CHOLECYSTECTOMY    . MOUTH SURGERY    . TUBAL LIGATION    . WISDOM TOOTH EXTRACTION       OB History    Gravida  2   Para  2   Term  2   Preterm      AB      Living  2     SAB      TAB      Ectopic      Multiple      Live Births              Family History  Problem Relation Age of Onset  . Diabetes Mother   . Hypertension Mother   . Hypertension Father     Social History   Tobacco Use  . Smoking status: Former Smoker    Packs/day: 0.04    Years: 1.50    Pack years: 0.06    Types: Cigarettes    Quit date: 11/21/2010    Years since quitting: 8.8  . Smokeless tobacco: Never Used  Substance Use Topics  . Alcohol use: No    Alcohol/week: 0.0 standard drinks    . Drug use: No    Home Medications Prior to Admission medications   Medication Sig Start Date End Date Taking? Authorizing Provider  cyclobenzaprine (FLEXERIL) 10 MG tablet Take 1 tablet (10 mg total) by mouth 3 (three) times daily as needed. 06/24/18   Triplett, Tammy, PA-C  ibuprofen (ADVIL,MOTRIN) 200 MG tablet Take 200 mg by mouth every 6 (six) hours as needed for mild pain (takes 800 mg at a time).    [provider]  methocarbamol (ROBAXIN) 500 MG tablet Take 2 tablets (1,000 mg total) by mouth 4 (four) times daily as needed for muscle spasms (muscle spasm/pain). Patient not taking: Reported on 06/12/2018 09/23/17   Samuel Jester, DO  naproxen (NAPROSYN) 500 MG tablet Take 1 tablet (500 mg total) by mouth 2 (two) times daily for 10 days. 09/26/19 10/06/19  Arlyn Dunning, PA-C  promethazine (PHENERGAN) 25  MG tablet Take 1 tablet (25 mg total) by mouth every 6 (six) hours as needed for nausea or vomiting. 06/16/18   Burgess Amor, PA-C  traMADol (ULTRAM) 50 MG tablet Take 1 tablet (50 mg total) by mouth every 6 (six) hours as needed. 06/16/18   Burgess Amor, PA-C    Allergies    Coconut flavor and Pineapple  Review of Systems   Review of Systems  Musculoskeletal: Positive for arthralgias and joint swelling.  Skin: Negative for rash and wound.  Neurological: Negative for syncope and numbness.    Physical Exam Updated Vital Signs BP (!) 141/96 (BP Location: Right Arm)   Pulse 77   Temp 98.1 F (36.7 C) (Oral)   Resp 17   Ht 5\' 6"  (1.676 m)   Wt 115.7 kg   SpO2 99%   BMI 41.16 kg/m   Physical Exam Vitals and nursing note reviewed.  Constitutional:      Appearance: Normal appearance.  HENT:     Head: Normocephalic.  Eyes:     Conjunctiva/sclera: Conjunctivae normal.  Pulmonary:     Effort: Pulmonary effort is normal.  Musculoskeletal:     Left knee: No deformity or effusion. No tenderness.     Right ankle: Normal pulse.     Left ankle: Swelling (Mild anterior  swelling.) present. No deformity, ecchymosis or lacerations. Tenderness present over the medial malleolus and ATF ligament. No lateral malleolus, AITF ligament, CF ligament, posterior TF ligament, base of 5th metatarsal or proximal fibula tenderness. Decreased range of motion (Decreased plantar and dorsiflexion, secondary to pain). Normal pulse.     Left Achilles Tendon: Normal.  Skin:    General: Skin is dry.     Capillary Refill: Capillary refill takes less than 2 seconds.  Neurological:     Mental Status: She is alert.     Sensory: No sensory deficit.  Psychiatric:        Mood and Affect: Mood normal.     ED Results / Procedures / Treatments   Labs (all labs ordered are listed, but only abnormal results are displayed) Labs Reviewed - No data to display  EKG None  Radiology DG Ankle Complete Left  Result Date: 09/26/2019 CLINICAL DATA:  Fall, pain EXAM: LEFT ANKLE COMPLETE - 3+ VIEW COMPARISON:  2018 FINDINGS: Soft tissue swelling at the ankle. No acute fracture. No dislocation. Joint spaces are preserved. IMPRESSION: No acute fracture. Electronically Signed   By: 2019 M.D.   On: 09/26/2019 13:51    Procedures Procedures (including critical care time)  Medications Ordered in ED Medications  ketorolac (TORADOL) injection 60 mg (has no administration in time range)    ED Course  I have reviewed the triage vital signs and the nursing notes.  Pertinent labs & imaging results that were available during my care of the patient were reviewed by me and considered in my medical decision making (see chart for details).  Clinical Course as of Sep 26 1414  Tue Sep 26, 2019  1404 Patient presenting with ankle sprain after injury 2 days ago.  Patient is ambulatory but with pain.  X-ray is reassuring.  Offered her Ace wrap and crutches.  Advised early ambulation and early rehabilitation of ankle.  She was given information for orthopedics if she would like to follow-up with them  in 1 week.  Otherwise rest, ice, elevation and gentle stretching was advised.  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 Therefore work note was given until she can f/u with ortho   [KM]    Clinical Course User Index [KM] Kristine Royal   MDM Rules/Calculators/A&P                      Based on review of vitals, medical screening exam, lab work and/or imaging, there does not appear to be an acute, emergent etiology for the patient's symptoms. Counseled pt on good return precautions and encouraged both PCP and ED follow-up as needed.  Prior to discharge, I also discussed incidental imaging findings with patient in detail and advised appropriate, recommended follow-up in detail.  Clinical Impression: 1. Sprain of left ankle, unspecified ligament, initial encounter     Disposition: Discharge  Prior to providing a prescription for a controlled substance, I independently reviewed the patient's recent prescription history on the [place]. The patient had no recent or regular prescriptions and was deemed appropriate for a brief, less than 3 day prescription of narcotic for acute analgesia.  This note was prepared with assistance of Systems analyst. Occasional wrong-word or sound-a-like substitutions may have occurred due to the inherent limitations of voice recognition software.  Final Clinical Impression(s) / ED Diagnoses Final diagnoses:  Sprain of left ankle, unspecified ligament, initial encounter    Rx / DC Orders ED Discharge Orders         Ordered    naproxen (NAPROSYN) 500 MG tablet  2 times daily     09/26/19 1406           Kristine Royal 09/26/19 1415    Alveria Apley, PA-C 09/26/19 1416    Maudie Flakes, MD 09/27/19 [phone]

## 2019-09-26 NOTE — Discharge Instructions (Addendum)
You are seen today in the emergency department for an ankle injury.  Your x-ray was reassuring that there is no fracture, break or dislocation.  It is important that you start to ambulate on your ankle as soon as you feel like you can.  Use the Ace wrap for comfort and apply ice and elevate your leg when not in use.  You may follow-up with orthopedics for further evaluation and treatment if you feel you are not improving. Thank you for allowing me to care for you today. Please return to the emergency department if you have new or worsening symptoms. Take your medications as instructed.

## 2019-10-19 ENCOUNTER — Other Ambulatory Visit: Payer: Self-pay

## 2019-10-19 ENCOUNTER — Ambulatory Visit: Payer: Medicaid Other | Attending: Internal Medicine

## 2019-10-19 DIAGNOSIS — Z20822 Contact with and (suspected) exposure to covid-19: Secondary | ICD-10-CM

## 2019-10-20 LAB — SARS-COV-2, NAA 2 DAY TAT

## 2019-10-20 LAB — NOVEL CORONAVIRUS, NAA: SARS-CoV-2, NAA: NOT DETECTED

## 2019-12-06 ENCOUNTER — Other Ambulatory Visit: Payer: Self-pay

## 2019-12-06 ENCOUNTER — Emergency Department (HOSPITAL_COMMUNITY)
Admission: EM | Admit: 2019-12-06 | Discharge: 2019-12-07 | Disposition: A | Discharge status: Home / Self Care | Payer: Medicaid Other | Attending: Emergency Medicine | Admitting: Emergency Medicine

## 2019-12-06 ENCOUNTER — Encounter (HOSPITAL_COMMUNITY): Payer: Self-pay | Admitting: Emergency Medicine

## 2019-12-06 DIAGNOSIS — R112 Nausea with vomiting, unspecified: Secondary | ICD-10-CM

## 2019-12-06 DIAGNOSIS — R1110 Vomiting, unspecified: Secondary | ICD-10-CM | POA: Insufficient documentation

## 2019-12-06 DIAGNOSIS — L304 Erythema intertrigo: Secondary | ICD-10-CM

## 2019-12-06 DIAGNOSIS — Z87891 Personal history of nicotine dependence: Secondary | ICD-10-CM | POA: Insufficient documentation

## 2019-12-06 DIAGNOSIS — Z91018 Allergy to other foods: Secondary | ICD-10-CM | POA: Insufficient documentation

## 2019-12-06 DIAGNOSIS — B009 Herpesviral infection, unspecified: Secondary | ICD-10-CM | POA: Insufficient documentation

## 2019-12-06 LAB — COMPREHENSIVE METABOLIC PANEL
ALT: 24 U/L (ref 0–44)
AST: 22 U/L (ref 15–41)
Albumin: 3.9 g/dL (ref 3.5–5.0)
Alkaline Phosphatase: 41 U/L (ref 38–126)
Anion gap: 11 (ref 5–15)
BUN: 11 mg/dL (ref 6–20)
CO2: 25 mmol/L (ref 22–32)
Calcium: 8.7 mg/dL — ABNORMAL LOW (ref 8.9–10.3)
Chloride: 102 mmol/L (ref 98–111)
Creatinine, Ser: 0.61 mg/dL (ref 0.44–1.00)
GFR calc Af Amer: >60 mL/min (ref >60)
GFR calc non Af Amer: >60 mL/min (ref >60)
Glucose, Bld: 102 mg/dL — ABNORMAL HIGH (ref 70–99)
Potassium: 3.4 mmol/L — ABNORMAL LOW (ref 3.5–5.1)
Sodium: 138 mmol/L (ref 135–145)
Total Bilirubin: 0.8 mg/dL (ref 0.3–1.2)
Total Protein: 7 g/dL (ref 6.5–8.1)

## 2019-12-06 LAB — CBC
HCT: 36 % (ref 36.0–46.0)
Hemoglobin: 11.7 g/dL — ABNORMAL LOW (ref 12.0–15.0)
MCH: 26 pg (ref 26.0–34.0)
MCHC: 32.5 g/dL (ref 30.0–36.0)
MCV: 80 fL (ref 80.0–100.0)
Platelets: 284 10*3/uL (ref 150–400)
RBC: 4.5 MIL/uL (ref 3.87–5.11)
RDW: 13.6 % (ref 11.5–15.5)
WBC: 8.4 10*3/uL (ref 4.0–10.5)
nRBC: 0 % (ref 0.0–0.2)

## 2019-12-06 LAB — LIPASE, BLOOD: Lipase: 25 U/L (ref 11–51)

## 2019-12-06 LAB — CK: Total CK: 145 U/L (ref 38–234)

## 2019-12-06 MED ORDER — SODIUM CHLORIDE 0.9% FLUSH: 3.0000 mL | Freq: Once | INTRAVENOUS | Status: DC

## 2019-12-06 MED ORDER — KETOCONAZOLE 2 % EX CREA: 1.0000 "application " | TOPICAL_CREAM | Freq: Every day | CUTANEOUS | 0 refills | Status: DC

## 2019-12-06 MED ORDER — SODIUM CHLORIDE 0.9 % IV BOLUS
1000.0000 mL | Freq: Once | INTRAVENOUS | Status: AC
  Administered 2019-12-06: 1000 mL via INTRAVENOUS

## 2019-12-06 MED ORDER — ONDANSETRON HCL 4 MG/2ML IJ SOLN
4.0000 mg | Freq: Once | INTRAMUSCULAR | Status: AC
  Administered 2019-12-06: 4 mg via INTRAVENOUS
  Filled 2019-12-06: qty 2

## 2019-12-06 NOTE — ED Triage Notes (Signed)
Pt c/o emesis since 1000 today.

## 2019-12-06 NOTE — Discharge Instructions (Signed)

## 2019-12-06 NOTE — ED Provider Notes (Signed)
[facility] EMERGENCY DEPARTMENT Provider Note   CSN: [id number] Arrival date & time: 12/06/19  2111     History Chief Complaint  Patient presents with  . Emesis    Stacy [name] is a 34 y.o. female who presents emergency department chief complaint of vomiting.  Patient states that yesterday she spent all day doing yard work in the heat.  This morning she awoke before work to finish up her yard work.  When she got to work she started feeling very badly.  She felt lightheaded every time she would stand.  She states that she started feeling almost confused and had several episodes of vomiting.  She kept trying to drink Gatorade and water but was unable to hold down any foods or fluids.  She is never had anything like this before.  She denies abdominal pain, fever or chills.  She rates her nausea currently as moderate.  Patient also is complaining of a rash under her pannus.  This began 2 days ago after sweating heavily.  HPI     Past Medical History:  Diagnosis Date  . Carpal tunnel syndrome   . Migraine   . Obesity     Patient Active Problem List   Diagnosis Date Noted  . HSV-2 (herpes simplex virus 2) infection 08/07/2015  . Precordial pain 05/31/2013  . Obesity   . Carpal tunnel syndrome     Past Surgical History:  Procedure Laterality Date  . CESAREAN SECTION    . CESAREAN SECTION    . CHOLECYSTECTOMY    . MOUTH SURGERY    . TUBAL LIGATION    . WISDOM TOOTH EXTRACTION       OB History    Gravida  2   Para  2   Term  2   Preterm      AB      Living  2     SAB      TAB      Ectopic      Multiple      Live Births              Family History  Problem Relation Age of Onset  . Diabetes Mother   . Hypertension Mother   . Hypertension Father     Social History   Tobacco Use  . Smoking status: Former Smoker    Packs/day: 0.04    Years: 1.50    Pack years: 0.06    Types: Cigarettes    Quit date: 11/21/2010    Years since quitting: 9.0    . Smokeless tobacco: Never Used  Vaping Use  . Vaping Use: Never used  Substance Use Topics  . Alcohol use: No    Alcohol/week: 0.0 standard drinks  . Drug use: No    Home Medications Prior to Admission medications   Medication Sig Start Date End Date Taking? Authorizing Provider  cyclobenzaprine (FLEXERIL) 10 MG tablet Take 1 tablet (10 mg total) by mouth 3 (three) times daily as needed. 06/24/18   Triplett, Tammy, PA-C  ibuprofen (ADVIL,MOTRIN) 200 MG tablet Take 200 mg by mouth every 6 (six) hours as needed for mild pain (takes 800 mg at a time).    [provider]  methocarbamol (ROBAXIN) 500 MG tablet Take 2 tablets (1,000 mg total) by mouth 4 (four) times daily as needed for muscle spasms (muscle spasm/pain). Patient not taking: Reported on 06/12/2018 09/23/17   Francine Graven, DO  promethazine (PHENERGAN) 25 MG tablet Take 1  tablet (25 mg total) by mouth every 6 (six) hours as needed for nausea or vomiting. 06/16/18   Burgess Amor, PA-C  traMADol (ULTRAM) 50 MG tablet Take 1 tablet (50 mg total) by mouth every 6 (six) hours as needed. 06/16/18   Burgess Amor, PA-C    Allergies    Coconut flavor and Pineapple  Review of Systems   Review of Systems Ten systems reviewed and are negative for acute change, except as noted in the HPI.   Physical Exam Updated Vital Signs BP 124/73 (BP Location: Left Arm)   Pulse 68   Temp 98.7 F (37.1 C) (Oral)   Resp 20   Ht 5\' 7"  (1.702 m)   Wt 111.1 kg   LMP 11/06/2019   SpO2 98%   BMI 38.37 kg/m   Physical Exam Vitals and nursing note reviewed.  Constitutional:      General: She is not in acute distress.    Appearance: She is well-developed. She is not diaphoretic.  HENT:     Head: Normocephalic and atraumatic.  Eyes:     General: No scleral icterus.    Conjunctiva/sclera: Conjunctivae normal.  Cardiovascular:     Rate and Rhythm: Normal rate and regular rhythm.     Heart sounds: Normal heart sounds. No murmur heard.   No friction rub. No gallop.   Pulmonary:     Effort: Pulmonary effort is normal. No respiratory distress.     Breath sounds: Normal breath sounds.  Abdominal:     General: Bowel sounds are normal. There is no distension.     Palpations: Abdomen is soft. There is no mass.     Tenderness: There is no abdominal tenderness. There is no guarding.     Comments: Erythematous rash with excoriation present in the intertriginous region on the left side of the abdominal pannus.  Musculoskeletal:     Cervical back: Normal range of motion.  Skin:    General: Skin is warm and dry.  Neurological:     Mental Status: She is alert and oriented to person, place, and time.  Psychiatric:        Behavior: Behavior normal.     ED Results / Procedures / Treatments   Labs (all labs ordered are listed, but only abnormal results are displayed) Labs Reviewed  LIPASE, BLOOD  COMPREHENSIVE METABOLIC PANEL  CBC  URINALYSIS, ROUTINE W REFLEX MICROSCOPIC  CK  POC URINE PREG, ED    EKG None  Radiology No results found.  Procedures Procedures (including critical care time)  Medications Ordered in ED Medications  sodium chloride flush (NS) 0.9 % injection 3 mL (has no administration in time range)  sodium chloride 0.9 % bolus 1,000 mL (has no administration in time range)  ondansetron (ZOFRAN) injection 4 mg (has no administration in time range)    ED Course  I have reviewed the triage vital signs and the nursing notes.  Pertinent labs & imaging results that were available during my care of the patient were reviewed by me and considered in my medical decision making (see chart for details).    MDM Rules/Calculators/A&P                          Patient here with complaint of emesis and possible heat injury.  I have ordered CBC which shows no elevated white blood cell count and mild normocytic anemia, CMP, lipase, urine pregnancy, urinalysis and CK levels are pending.  She is  hemodynamically  stable without fever.  She is receiving IV fluids, Zofran. Sign out given to Dr. Preston Fleeting at shift change. Will give ketoconazole for intertrigo. No active vomiting here in the er. Final Clinical Impression(s) / ED Diagnoses Final diagnoses:  None    Rx / DC Orders ED Discharge Orders    None       Arthor Captain, PA-C 12/06/19 2314    Jacalyn Lefevre, MD 12/06/19 2327

## 2019-12-07 LAB — URINALYSIS, ROUTINE W REFLEX MICROSCOPIC
Bilirubin Urine: NEGATIVE
Glucose, UA: NEGATIVE mg/dL
Hgb urine dipstick: NEGATIVE
Ketones, ur: NEGATIVE mg/dL
Leukocytes,Ua: NEGATIVE
Nitrite: NEGATIVE
Protein, ur: NEGATIVE mg/dL
Specific Gravity, Urine: 1.021 (ref 1.005–1.030)
pH: 5 (ref 5.0–8.0)

## 2019-12-07 LAB — PREGNANCY, URINE: Preg Test, Ur: NEGATIVE

## 2019-12-07 MED ORDER — ONDANSETRON HCL 4 MG PO TABS: 4.0000 mg | ORAL_TABLET | Freq: Three times a day (TID) | ORAL | 0 refills | Status: DC | PRN

## 2019-12-07 NOTE — ED Provider Notes (Signed)
Care assumed from [facility] and Dr. Particia Nearing, patient with vomiting, possible heat injury getting IV fluids and pending lab work.  Labs are unremarkable including normal CK.  Patient is feeling better following IV fluids.  In addition to medication prescribed by Arthor Captain, she is given a take-home pack of ondansetron.  Results for orders placed or performed during the hospital encounter of 12/06/19  Lipase, blood  Result Value Ref Range   Lipase 25 11 - 51 U/L  Comprehensive metabolic panel  Result Value Ref Range   Sodium 138 135 - 145 mmol/L   Potassium 3.4 (L) 3.5 - 5.1 mmol/L   Chloride 102 98 - 111 mmol/L   CO2 25 22 - 32 mmol/L   Glucose, Bld 102 (H) 70 - 99 mg/dL   BUN 11 6 - 20 mg/dL   Creatinine, Ser 4.43 0.44 - 1.00 mg/dL   Calcium 8.7 (L) 8.9 - 10.3 mg/dL   Total Protein 7.0 6.5 - 8.1 g/dL   Albumin 3.9 3.5 - 5.0 g/dL   AST 22 15 - 41 U/L   ALT 24 0 - 44 U/L   Alkaline Phosphatase 41 38 - 126 U/L   Total Bilirubin 0.8 0.3 - 1.2 mg/dL   GFR calc non Af Amer >60 >60 mL/min   GFR calc Af Amer >60 >60 mL/min   Anion gap 11 5 - 15  CBC  Result Value Ref Range   WBC 8.4 4.0 - 10.5 K/uL   RBC 4.50 3.87 - 5.11 MIL/uL   Hemoglobin 11.7 (L) 12.0 - 15.0 g/dL   HCT 15.4 36 - 46 %   MCV 80.0 80.0 - 100.0 fL   MCH 26.0 26.0 - 34.0 pg   MCHC 32.5 30.0 - 36.0 g/dL   RDW 00.8 67.6 - 19.5 %   Platelets 284 150 - 400 K/uL   nRBC 0.0 0.0 - 0.2 %  Urinalysis, Routine w reflex microscopic  Result Value Ref Range   Color, Urine YELLOW YELLOW   APPearance CLEAR CLEAR   Specific Gravity, Urine 1.021 1.005 - 1.030   pH 5.0 5.0 - 8.0   Glucose, UA NEGATIVE NEGATIVE mg/dL   Hgb urine dipstick NEGATIVE NEGATIVE   Bilirubin Urine NEGATIVE NEGATIVE   Ketones, ur NEGATIVE NEGATIVE mg/dL   Protein, ur NEGATIVE NEGATIVE mg/dL   Nitrite NEGATIVE NEGATIVE   Leukocytes,Ua NEGATIVE NEGATIVE  CK  Result Value Ref Range   Total CK 145 38.0 - 234.0 U/L  Pregnancy, urine  Result  Value Ref Range   Preg Test, Ur NEGATIVE NEGATIVE      Dione Booze, MD 12/07/19 [phone]

## 2020-06-16 ENCOUNTER — Other Ambulatory Visit: Payer: Self-pay | Admitting: Family Medicine

## 2020-06-16 DIAGNOSIS — B009 Herpesviral infection, unspecified: Secondary | ICD-10-CM

## 2020-06-19 NOTE — Telephone Encounter (Signed)
Patient requesting refill, though no appointment has been made.   Call placed to patient to advise no refills will be given without appointment.Marland Kitchen LMTRC.

## 2020-06-20 MED ORDER — METRONIDAZOLE 500 MG PO TABS: 500.0000 mg | ORAL_TABLET | Freq: Two times a day (BID) | ORAL | 0 refills | Status: DC

## 2020-06-20 MED ORDER — ACYCLOVIR 400 MG PO TABS: 400.0000 mg | ORAL_TABLET | Freq: Three times a day (TID) | ORAL | 1 refills | Status: DC

## 2020-06-20 NOTE — Telephone Encounter (Signed)
Received return call from patient.   Patient is requesting refill on Acyclovir and Flagyl. Patient has not been seen since 2019, and OV required for ABTx prescription.   Reports that her children have recently tested positive for COVID. Reports that she is under quarantine until 07/01/2019.  States that she frequently has BV after menses, and is having increased itching and burning. Reports that no white discharge noted, so she does not think it is a yeast infection.   Reports that she will be happy to schedule appointment after quarantine, but requested to have tx for possible BV now.   Please advise.

## 2020-06-20 NOTE — Addendum Note (Signed)
Addended by: Phillips Odor on: 06/20/2020 12:27 PM   Modules accepted: Orders

## 2020-07-17 ENCOUNTER — Other Ambulatory Visit: Payer: Self-pay

## 2020-07-17 ENCOUNTER — Emergency Department (HOSPITAL_COMMUNITY)
Admission: EM | Admit: 2020-07-17 | Discharge: 2020-07-17 | Disposition: A | Discharge status: Home / Self Care | Payer: 59 | Attending: Emergency Medicine | Admitting: Emergency Medicine

## 2020-07-17 ENCOUNTER — Encounter (HOSPITAL_COMMUNITY): Payer: Self-pay | Admitting: Emergency Medicine

## 2020-07-17 ENCOUNTER — Emergency Department (HOSPITAL_COMMUNITY): Payer: 59

## 2020-07-17 DIAGNOSIS — Z87891 Personal history of nicotine dependence: Secondary | ICD-10-CM | POA: Insufficient documentation

## 2020-07-17 DIAGNOSIS — R0602 Shortness of breath: Secondary | ICD-10-CM

## 2020-07-17 DIAGNOSIS — J069 Acute upper respiratory infection, unspecified: Secondary | ICD-10-CM | POA: Diagnosis not present

## 2020-07-17 DIAGNOSIS — U071 COVID-19: Secondary | ICD-10-CM | POA: Insufficient documentation

## 2020-07-17 LAB — BASIC METABOLIC PANEL
Anion gap: 7 (ref 5–15)
BUN: 9 mg/dL (ref 6–20)
CO2: 24 mmol/L (ref 22–32)
Calcium: 8.5 mg/dL — ABNORMAL LOW (ref 8.9–10.3)
Chloride: 105 mmol/L (ref 98–111)
Creatinine, Ser: 0.61 mg/dL (ref 0.44–1.00)
GFR, Estimated: >60 mL/min (ref >60)
Glucose, Bld: 83 mg/dL (ref 70–99)
Potassium: 3.3 mmol/L — ABNORMAL LOW (ref 3.5–5.1)
Sodium: 136 mmol/L (ref 135–145)

## 2020-07-17 LAB — CBC
HCT: 35 % — ABNORMAL LOW (ref 36.0–46.0)
Hemoglobin: 11.1 g/dL — ABNORMAL LOW (ref 12.0–15.0)
MCH: 25.4 pg — ABNORMAL LOW (ref 26.0–34.0)
MCHC: 31.7 g/dL (ref 30.0–36.0)
MCV: 80.1 fL (ref 80.0–100.0)
Platelets: 250 10*3/uL (ref 150–400)
RBC: 4.37 MIL/uL (ref 3.87–5.11)
RDW: 14 % (ref 11.5–15.5)
WBC: 4.4 10*3/uL (ref 4.0–10.5)
nRBC: 0 % (ref 0.0–0.2)

## 2020-07-17 LAB — TROPONIN I (HIGH SENSITIVITY)
Troponin I (High Sensitivity): <2 ng/L (ref <18)
Troponin I (High Sensitivity): <2 ng/L (ref <18)

## 2020-07-17 MED ORDER — ALBUTEROL SULFATE HFA 108 (90 BASE) MCG/ACT IN AERS
2.0000 | INHALATION_SPRAY | Freq: Once | RESPIRATORY_TRACT | Status: AC
  Administered 2020-07-17: 2 via RESPIRATORY_TRACT
  Filled 2020-07-17: qty 6.7

## 2020-07-17 NOTE — ED Notes (Signed)
Patient on 5 lead at this time. Vitals updated. Patient aware that urine specimen is needed.

## 2020-07-17 NOTE — Discharge Instructions (Signed)
Your x-ray is normal, your Covid test is pending, the other blood work was unremarkable.  Please follow-up with your doctor within the week, you likely have an upper respiratory virus.  You will be contagious to others.  There is no signs of pneumonia.  Please take albuterol inhaler 2 puffs every 4 hours as needed for shortness of breath, you may use DayQuil or NyQuil as well.  Seek medical exam for severe or worsening symptoms

## 2020-07-17 NOTE — ED Provider Notes (Signed)
[facility] EMERGENCY DEPARTMENT Provider Note   CSN: [id number] Arrival date & time: 07/17/20  1706     History Chief Complaint  Patient presents with  . Chest Pain    Stacy [name] is a 35 y.o. female.  HPI   This patient is a 35 year old female, she has no prior history of cardiac disease, she does have a history of "precordial pain" but is never had any diagnosed cardiac dysfunction.  She also has no prior history of lung disease.  She presents today with a complaint of several days of worsening symptoms including some shortness of breath with exertion, having fevers as high as 102 yesterday, she has a runny nose with some congestion as well.  She denies any swelling of her legs, she does not have any specific Covid exposures and she actually was diagnosed with Covid approximately 2-1/2 months ago.  She healed completely and had no symptoms prior to this happening several days ago.  The patient denies being vaccinated but did have the infection.  She has had no rashes, no significant coughing, no significant headaches or blurred vision.  She has not had any medications other than over-the-counter antipyretics.  The patient has had some chest heaviness associated with shortness of breath, it is intermittent, nonexertional  Past Medical History:  Diagnosis Date  . Carpal tunnel syndrome   . Migraine   . Obesity     Patient Active Problem List   Diagnosis Date Noted  . HSV-2 (herpes simplex virus 2) infection 08/07/2015  . Precordial pain 05/31/2013  . Obesity   . Carpal tunnel syndrome     Past Surgical History:  Procedure Laterality Date  . CESAREAN SECTION    . CESAREAN SECTION    . CHOLECYSTECTOMY    . MOUTH SURGERY    . TUBAL LIGATION    . WISDOM TOOTH EXTRACTION       OB History    Gravida  2   Para  2   Term  2   Preterm      AB      Living  2     SAB      IAB      Ectopic      Multiple      Live Births              Family History   Problem Relation Age of Onset  . Diabetes Mother   . Hypertension Mother   . Hypertension Father     Social History   Tobacco Use  . Smoking status: Former Smoker    Packs/day: 0.04    Years: 1.50    Pack years: 0.06    Types: Cigarettes    Quit date: 11/21/2010    Years since quitting: 9.6  . Smokeless tobacco: Never Used  Vaping Use  . Vaping Use: Never used  Substance Use Topics  . Alcohol use: No    Alcohol/week: 0.0 standard drinks  . Drug use: No    Home Medications Prior to Admission medications   Medication Sig Start Date End Date Taking? Authorizing Provider  acyclovir (ZOVIRAX) 400 MG tablet Take 1 tablet (400 mg total) by mouth 3 (three) times daily. 06/20/20   Donita Brooks, MD  ketoconazole (NIZORAL) 2 % cream Apply 1 application topically daily. 12/06/19   Harris, Cammy Copa, PA-C  metroNIDAZOLE (FLAGYL) 500 MG tablet Take 1 tablet (500 mg total) by mouth 2 (two) times daily. 06/20/20   Donita Brooks,  MD  ondansetron (ZOFRAN) 4 MG tablet Take 1 tablet (4 mg total) by mouth every 8 (eight) hours as needed for nausea or vomiting. 12/07/19   Dione Booze, MD  promethazine (PHENERGAN) 25 MG tablet Take 1 tablet (25 mg total) by mouth every 6 (six) hours as needed for nausea or vomiting. 06/16/18 12/07/19  Burgess Amor, PA-C    Allergies    Coconut flavor and Pineapple  Review of Systems   Review of Systems  All other systems reviewed and are negative.   Physical Exam Updated Vital Signs BP (!) 105/47   Pulse 77   Temp 100 F (37.8 C) (Oral)   Resp (!) 23   Ht 1.702 m (5\' 7" )   Wt 108.9 kg   LMP 07/10/2020 (Approximate)   SpO2 100%   BMI 37.59 kg/m   Physical Exam Vitals and nursing note reviewed.  Constitutional:      General: She is not in acute distress.    Appearance: She is well-developed and well-nourished.  HENT:     Head: Normocephalic and atraumatic.     Comments: Nasal passages are clear other than mild rhinorrhea which is clear,  oropharynx is clear without exudate asymmetry hypertrophy or erythema, phonation is normal, mucous membranes are moist, no trismus or torticollis.    Mouth/Throat:     Mouth: Oropharynx is clear and moist.     Pharynx: No oropharyngeal exudate.  Eyes:     General: No scleral icterus.       Right eye: No discharge.        Left eye: No discharge.     Extraocular Movements: EOM normal.     Conjunctiva/sclera: Conjunctivae normal.     Pupils: Pupils are equal, round, and reactive to light.  Neck:     Thyroid: No thyromegaly.     Vascular: No JVD.  Cardiovascular:     Rate and Rhythm: Normal rate and regular rhythm.     Pulses: Intact distal pulses.     Heart sounds: Normal heart sounds. No murmur heard. No friction rub. No gallop.      Comments: Heart rate of approximately 90 bpm, normal pulses at the radial arteries, no JVD Pulmonary:     Effort: Pulmonary effort is normal. No respiratory distress.     Breath sounds: Normal breath sounds. No wheezing or rales.     Comments: Lung sounds are totally clear Abdominal:     General: Bowel sounds are normal. There is no distension.     Palpations: Abdomen is soft. There is no mass.     Tenderness: There is no abdominal tenderness.  Musculoskeletal:        General: No tenderness or edema. Normal range of motion.     Cervical back: Normal range of motion and neck supple.     Right lower leg: No edema.     Left lower leg: No edema.  Lymphadenopathy:     Cervical: No cervical adenopathy.  Skin:    General: Skin is warm and dry.     Findings: No erythema or rash.  Neurological:     Mental Status: She is alert.     Coordination: Coordination normal.  Psychiatric:        Mood and Affect: Mood and affect normal.        Behavior: Behavior normal.     ED Results / Procedures / Treatments   Labs (all labs ordered are listed, but only abnormal results are displayed) Labs Reviewed  BASIC  METABOLIC PANEL - Abnormal; Notable for the  following components:      Result Value   Potassium 3.3 (*)    Calcium 8.5 (*)    All other components within normal limits  CBC - Abnormal; Notable for the following components:   Hemoglobin 11.1 (*)    HCT 35.0 (*)    MCH 25.4 (*)    All other components within normal limits  SARS CORONAVIRUS 2 (TAT 6-24 HRS)  POC URINE PREG, ED  TROPONIN I (HIGH SENSITIVITY)  TROPONIN I (HIGH SENSITIVITY)    EKG EKG Interpretation  Date/Time:  Wednesday July 17 2020 17:13:26 EST Ventricular Rate:  100 PR Interval:  142 QRS Duration: 82 QT Interval:  346 QTC Calculation: 446 R Axis:   73 Text Interpretation: Normal sinus rhythm Normal ECG Since last tracing rate faster Confirmed by Eber Hong ([id number]) on 07/17/2020 5:24:49 PM   Radiology DG Chest Port 1 View  Result Date: 07/17/2020 CLINICAL DATA:  Cough, shortness of breath, fever, and chest pain. Testing for COVID-19. Former smoker. EXAM: PORTABLE CHEST 1 VIEW COMPARISON:  02/12/2020 FINDINGS: The heart size and mediastinal contours are within normal limits. Both lungs are clear. The visualized skeletal structures are unremarkable. IMPRESSION: No active disease. Electronically Signed   By: Burman Nieves M.D.   On: 07/17/2020 20:31    Procedures Procedures   Medications Ordered in ED Medications  albuterol (VENTOLIN HFA) 108 (90 Base) MCG/ACT inhaler 2 puff (has no administration in time range)    ED Course  I have reviewed the triage vital signs and the nursing notes.  Pertinent labs & imaging results that were available during my care of the patient were reviewed by me and considered in my medical decision making (see chart for details).    MDM Rules/Calculators/A&P                          The patient's EKG was totally unremarkable, she has no signs of ischemia, her exam is unremarkable, she feels like she is short of breath but lung sounds are totally clear and oxygen level is 96 to 100% on room air without rales or  wheezing.  She speaks in full sentences.  We will check labs and a Covid test as well as a chest x-ray, the patient is agreeable.  The fever and the shortness of breath suggest a pulmonary or infectious pathology, with the rhinorrhea this may very well just be viral.  Chest x-ray negative, labs unremarkable, EKG without any signs of ischemia  Patient aware of indications for return, will be given albuterol inhaler, stable for discharge   Final Clinical Impression(s) / ED Diagnoses Final diagnoses:  Shortness of breath  Upper respiratory tract infection, unspecified type      Eber Hong, MD 07/17/20 2232

## 2020-07-17 NOTE — ED Triage Notes (Signed)
Pt states she has chest discomfort that began yesterday along with many covid symptoms.

## 2020-07-18 LAB — SARS CORONAVIRUS 2 (TAT 6-24 HRS): SARS Coronavirus 2: POSITIVE — AB

## 2020-07-19 ENCOUNTER — Telehealth: Payer: Self-pay | Admitting: Infectious Diseases

## 2020-07-19 NOTE — Telephone Encounter (Signed)
Called to discuss with patient about COVID-19 symptoms and the use of one of the available treatments for those with mild to moderate Covid symptoms and at a high risk of hospitalization.  Pt appears to qualify for outpatient treatment due to co-morbid conditions and/or a member of an at-risk group in accordance with the FDA Emergency Use Authorization.    Symptom onset: 07/16/2020 Vaccinated: no Booster? no Immunocompromised? no Qualifiers: obesity, unvaccinated   Unable to reach pt - LVM & sent [facility].   She would be a potential candidate for oral antiviral therapy vs monoclonal ab given unvaccinated state. Did just recover from acute covid 2.5 months ago and apparently has symptoms c/w re-infection now.   Stacy [name]

## 2021-06-19 ENCOUNTER — Ambulatory Visit: Payer: Self-pay | Admitting: Nurse Practitioner

## 2021-06-19 ENCOUNTER — Ambulatory Visit: Payer: 59 | Admitting: Family Medicine

## 2021-06-26 ENCOUNTER — Encounter: Payer: Self-pay | Admitting: Family Medicine

## 2021-06-26 ENCOUNTER — Other Ambulatory Visit: Payer: Self-pay

## 2021-06-26 ENCOUNTER — Ambulatory Visit (INDEPENDENT_AMBULATORY_CARE_PROVIDER_SITE_OTHER): Payer: 59 | Admitting: Family Medicine

## 2021-06-26 VITALS — BP 118/72 | HR 68 | Ht 67.0 in | Wt 258.0 lb

## 2021-06-26 DIAGNOSIS — R208 Other disturbances of skin sensation: Secondary | ICD-10-CM

## 2021-06-26 DIAGNOSIS — B009 Herpesviral infection, unspecified: Secondary | ICD-10-CM

## 2021-06-26 LAB — URINALYSIS, MICROSCOPIC ONLY
Casts: NONE SEEN /LPF
Crystals: NONE SEEN /HPF
Hyaline Cast: NONE SEEN /LPF
Yeast: NONE SEEN /HPF

## 2021-06-26 LAB — URINALYSIS
Bilirubin Urine: NEGATIVE
Glucose, UA: NEGATIVE
Ketones, ur: NEGATIVE
Leukocytes,Ua: NEGATIVE
Nitrite: NEGATIVE
Protein, ur: NEGATIVE
Specific Gravity, Urine: 1.02 (ref 1.001–1.035)
pH: 7.5 (ref 5.0–8.0)

## 2021-06-26 MED ORDER — METRONIDAZOLE 500 MG PO TABS: 500.0000 mg | ORAL_TABLET | Freq: Two times a day (BID) | ORAL | 0 refills | Status: DC

## 2021-06-26 MED ORDER — ACYCLOVIR 400 MG PO TABS: 400.0000 mg | ORAL_TABLET | Freq: Three times a day (TID) | ORAL | 5 refills | Status: DC

## 2021-06-26 NOTE — Progress Notes (Signed)
Subjective:    Patient ID: Stacy [name], female    DOB: 07-07-1985, 36 y.o.   MRN: [id number]  HPI  Patient is a very pleasant 28 year [name] Caucasian female who presents today with pelvic pressure.  She states that she feels like she has to urinate.  She denies any dysuria but she does report frequency.  This been going on for 2 weeks ever since her last menstrual period.  She is not sexually active.  Therefore she denies any potential exposure to gonorrhea or chlamydia or trichomoniasis.  Urinalysis today shows no nitrates.  There are no leukocyte esterase.  There is a trace amount of blood.  However microscopic analysis shows 40-60 squamous cell a few bacteria but is otherwise significant only for moderate mucus seems to be a vaginal contaminant.  Patient also has a history of frequent BV after her periods.  She states that she "is currently suffering with bacterial vaginosis".  She questions if this could be causing some of her bladder spasms. Past Medical History:  Diagnosis Date   Carpal tunnel syndrome    Migraine    Obesity    Past Surgical History:  Procedure Laterality Date   CESAREAN SECTION     CESAREAN SECTION     CHOLECYSTECTOMY     MOUTH SURGERY     TUBAL LIGATION     WISDOM TOOTH EXTRACTION     Current Outpatient Medications on File Prior to Visit  Medication Sig Dispense Refill   [DISCONTINUED] promethazine (PHENERGAN) 25 MG tablet Take 1 tablet (25 mg total) by mouth every 6 (six) hours as needed for nausea or vomiting. 30 tablet 0   No current facility-administered medications on file prior to visit.   Allergies  Allergen Reactions   Coconut Flavor Other (See Comments)    Mouth/throat lesions    Pineapple Other (See Comments)    Mouth/throat leasions   Social History   Socioeconomic History   Marital status: Single    Spouse name: Not on file   Number of children: 2   Years of education: 10th    Highest education level: Not on file  Occupational History    Occupation: Crew member at [employer]  Tobacco Use   Smoking status: Former    Packs/day: 0.04    Years: 1.50    Pack years: 0.06    Types: Cigarettes    Quit date: 11/21/2010    Years since quitting: 10.6   Smokeless tobacco: Never  Vaping Use   Vaping Use: Never used  Substance and Sexual Activity   Alcohol use: No    Alcohol/week: 0.0 standard drinks   Drug use: No   Sexual activity: Not Currently    Birth control/protection: Surgical  Other Topics Concern   Not on file  Social History Narrative   Pt lives with 2 children, ages 5 & 7.   Consumes soda on regular basis, but has stopped in the last 3 weeks    Social Determinants of Health   Financial Resource Strain: Not on file  Food Insecurity: Not on file  Transportation Needs: Not on file  Physical Activity: Not on file  Stress: Not on file  Social Connections: Not on file  Intimate Partner Violence: Not on file     Review of Systems  All other systems reviewed and are negative.     Objective:   Physical Exam Constitutional:      Appearance: Normal appearance. She is obese. She is not ill-appearing, toxic-appearing or diaphoretic.  Cardiovascular:     Rate and Rhythm: Normal rate and regular rhythm.     Pulses: Normal pulses.     Heart sounds: Normal heart sounds.  Pulmonary:     Effort: Pulmonary effort is normal.     Breath sounds: Normal breath sounds.  Abdominal:     General: Bowel sounds are normal. There is no distension.     Palpations: Abdomen is soft. There is no mass.     Tenderness: There is no abdominal tenderness. There is no guarding or rebound.  Neurological:     Mental Status: She is alert.          Assessment & Plan:  Burning sensation - Plan: Urinalysis, Urine Microscopic  HSV (herpes simplex virus) infection - Plan: acyclovir (ZOVIRAX) 400 MG tablet I see no evidence of urinary tract infection today on her urinalysis.  I believe the patient may be dealing with bladder spasms  secondary to bacterial vaginosis.  I recommended that she take the Flagyl 500 mg twice daily for 7 days that she has at home with bacterial vaginosis as she has a frequent history of this in the past.  If symptoms persist, consider a pelvic ultrasound to evaluate for any fibroids impinging pressure on the bladder however her physical exam today otherwise unremarkable.  I did refill her acyclovir that she takes occasionally for herpes outbreaks.  She gets 1-2 a year.

## 2021-11-19 ENCOUNTER — Encounter (HOSPITAL_COMMUNITY): Payer: Self-pay | Admitting: Emergency Medicine

## 2021-11-19 ENCOUNTER — Emergency Department (HOSPITAL_COMMUNITY): Payer: 59

## 2021-11-19 ENCOUNTER — Ambulatory Visit (INDEPENDENT_AMBULATORY_CARE_PROVIDER_SITE_OTHER): Payer: 59

## 2021-11-19 ENCOUNTER — Other Ambulatory Visit: Payer: Self-pay

## 2021-11-19 ENCOUNTER — Emergency Department (HOSPITAL_COMMUNITY)
Admission: EM | Admit: 2021-11-19 | Discharge: 2021-11-19 | Disposition: A | Discharge status: Home / Self Care | Payer: 59 | Attending: Emergency Medicine | Admitting: Emergency Medicine

## 2021-11-19 ENCOUNTER — Ambulatory Visit
Admission: EM | Admit: 2021-11-19 | Discharge: 2021-11-19 | Disposition: A | Discharge status: Home / Self Care | Payer: 59 | Attending: Nurse Practitioner | Admitting: Nurse Practitioner

## 2021-11-19 ENCOUNTER — Encounter: Payer: Self-pay | Admitting: Emergency Medicine

## 2021-11-19 DIAGNOSIS — R112 Nausea with vomiting, unspecified: Secondary | ICD-10-CM | POA: Diagnosis present

## 2021-11-19 DIAGNOSIS — E876 Hypokalemia: Secondary | ICD-10-CM | POA: Insufficient documentation

## 2021-11-19 DIAGNOSIS — D72829 Elevated white blood cell count, unspecified: Secondary | ICD-10-CM | POA: Diagnosis not present

## 2021-11-19 DIAGNOSIS — K5792 Diverticulitis of intestine, part unspecified, without perforation or abscess without bleeding: Secondary | ICD-10-CM

## 2021-11-19 DIAGNOSIS — R1030 Lower abdominal pain, unspecified: Secondary | ICD-10-CM | POA: Diagnosis not present

## 2021-11-19 DIAGNOSIS — R102 Pelvic and perineal pain: Secondary | ICD-10-CM

## 2021-11-19 DIAGNOSIS — K5732 Diverticulitis of large intestine without perforation or abscess without bleeding: Secondary | ICD-10-CM | POA: Diagnosis not present

## 2021-11-19 LAB — COMPREHENSIVE METABOLIC PANEL
ALT: 21 U/L (ref 0–44)
AST: 17 U/L (ref 15–41)
Albumin: 3.7 g/dL (ref 3.5–5.0)
Alkaline Phosphatase: 39 U/L (ref 38–126)
Anion gap: 8 (ref 5–15)
BUN: 10 mg/dL (ref 6–20)
CO2: 25 mmol/L (ref 22–32)
Calcium: 8.4 mg/dL — ABNORMAL LOW (ref 8.9–10.3)
Chloride: 103 mmol/L (ref 98–111)
Creatinine, Ser: 0.62 mg/dL (ref 0.44–1.00)
GFR, Estimated: >60 mL/min (ref >60)
Glucose, Bld: 92 mg/dL (ref 70–99)
Potassium: 2.9 mmol/L — ABNORMAL LOW (ref 3.5–5.1)
Sodium: 136 mmol/L (ref 135–145)
Total Bilirubin: 2 mg/dL — ABNORMAL HIGH (ref 0.3–1.2)
Total Protein: 6.9 g/dL (ref 6.5–8.1)

## 2021-11-19 LAB — CBC WITH DIFFERENTIAL/PLATELET
Abs Immature Granulocytes: 0.05 10*3/uL (ref 0.00–0.07)
Basophils Absolute: 0 10*3/uL (ref 0.0–0.1)
Basophils Relative: 0 %
Eosinophils Absolute: 0.1 10*3/uL (ref 0.0–0.5)
Eosinophils Relative: 1 %
HCT: 37.1 % (ref 36.0–46.0)
Hemoglobin: 12 g/dL (ref 12.0–15.0)
Immature Granulocytes: 0 %
Lymphocytes Relative: 21 %
Lymphs Abs: 2.6 10*3/uL (ref 0.7–4.0)
MCH: 25.1 pg — ABNORMAL LOW (ref 26.0–34.0)
MCHC: 32.3 g/dL (ref 30.0–36.0)
MCV: 77.5 fL — ABNORMAL LOW (ref 80.0–100.0)
Monocytes Absolute: 0.7 10*3/uL (ref 0.1–1.0)
Monocytes Relative: 6 %
Neutro Abs: 8.8 10*3/uL — ABNORMAL HIGH (ref 1.7–7.7)
Neutrophils Relative %: 72 %
Platelets: 290 10*3/uL (ref 150–400)
RBC: 4.79 MIL/uL (ref 3.87–5.11)
RDW: 13.7 % (ref 11.5–15.5)
WBC: 12.3 10*3/uL — ABNORMAL HIGH (ref 4.0–10.5)
nRBC: 0 % (ref 0.0–0.2)

## 2021-11-19 LAB — POCT URINALYSIS DIP (MANUAL ENTRY)
Glucose, UA: NEGATIVE mg/dL
Leukocytes, UA: NEGATIVE
Nitrite, UA: NEGATIVE
Protein Ur, POC: NEGATIVE mg/dL
Spec Grav, UA: 1.025 (ref 1.010–1.025)
Urobilinogen, UA: 0.2 E.U./dL
pH, UA: 5.5 (ref 5.0–8.0)

## 2021-11-19 LAB — URINALYSIS, ROUTINE W REFLEX MICROSCOPIC
Bacteria, UA: NONE SEEN
Bilirubin Urine: NEGATIVE
Glucose, UA: NEGATIVE mg/dL
Ketones, ur: 20 mg/dL — AB
Leukocytes,Ua: NEGATIVE
Nitrite: NEGATIVE
Protein, ur: NEGATIVE mg/dL
Specific Gravity, Urine: 1.027 (ref 1.005–1.030)
pH: 5 (ref 5.0–8.0)

## 2021-11-19 LAB — PREGNANCY, URINE: Preg Test, Ur: NEGATIVE

## 2021-11-19 LAB — LIPASE, BLOOD: Lipase: 20 U/L (ref 11–51)

## 2021-11-19 LAB — POCT URINE PREGNANCY: Preg Test, Ur: NEGATIVE

## 2021-11-19 MED ORDER — HYDROCODONE-ACETAMINOPHEN 5-325 MG PO TABS: 1.0000 | ORAL_TABLET | Freq: Four times a day (QID) | ORAL | 0 refills | Status: AC | PRN

## 2021-11-19 MED ORDER — AMOXICILLIN-POT CLAVULANATE 875-125 MG PO TABS
1.0000 | ORAL_TABLET | Freq: Two times a day (BID) | ORAL | 0 refills | Status: DC
Start: 2021-11-19 — End: 2021-12-09

## 2021-11-19 MED ORDER — ONDANSETRON 4 MG PO TBDP: 4.0000 mg | ORAL_TABLET | Freq: Three times a day (TID) | ORAL | 0 refills | Status: DC | PRN

## 2021-11-19 MED ORDER — IOHEXOL 300 MG/ML  SOLN
100.0000 mL | Freq: Once | INTRAMUSCULAR | Status: AC | PRN
  Administered 2021-11-19: 100 mL via INTRAVENOUS

## 2021-11-19 MED ORDER — HYDROMORPHONE HCL 1 MG/ML IJ SOLN
0.5000 mg | Freq: Once | INTRAMUSCULAR | Status: AC
  Administered 2021-11-19: 0.5 mg via INTRAVENOUS
  Filled 2021-11-19: qty 0.5

## 2021-11-19 MED ORDER — LACTATED RINGERS IV BOLUS
1000.0000 mL | Freq: Once | INTRAVENOUS | Status: AC
  Administered 2021-11-19: 1000 mL via INTRAVENOUS

## 2021-11-19 MED ORDER — KETOROLAC TROMETHAMINE 30 MG/ML IJ SOLN
30.0000 mg | Freq: Once | INTRAMUSCULAR | Status: AC
  Administered 2021-11-19: 30 mg via INTRAMUSCULAR

## 2021-11-19 MED ORDER — POTASSIUM CHLORIDE CRYS ER 20 MEQ PO TBCR
40.0000 meq | EXTENDED_RELEASE_TABLET | Freq: Once | ORAL | Status: AC
  Administered 2021-11-19: 40 meq via ORAL
  Filled 2021-11-19: qty 2

## 2021-11-19 MED ORDER — ONDANSETRON HCL 4 MG/2ML IJ SOLN
4.0000 mg | Freq: Once | INTRAMUSCULAR | Status: AC
  Administered 2021-11-19: 4 mg via INTRAVENOUS
  Filled 2021-11-19: qty 2

## 2021-11-19 NOTE — ED Notes (Signed)
Pending completion of AVS

## 2021-11-19 NOTE — Discharge Instructions (Addendum)
Go to the ER for further evaluation

## 2021-11-19 NOTE — ED Triage Notes (Signed)
Pt reports lower abdominal pain that radiates to lower back. Pt reports increase in pain with movement, intermittent nausea/diarrhea. Pt reports decreased urinary output denies fevers.

## 2021-11-19 NOTE — Discharge Instructions (Addendum)
There were medications sent to [facility] pharmacy.  1 of these is for Augmentin.  This is an antibiotic that is used to treat diverticulitis.  Take as prescribed.  Treat pain with ibuprofen and Tylenol.  Additional medications for relief of symptoms (Zofran for nausea and Norco for pain) were also sent to your pharmacy.  Take these only as needed.

## 2021-11-19 NOTE — ED Notes (Signed)
Seen at [facility], urine obtained showing microscopic hematuria. Denies urinary or vaginal sx. Mentions diarrhea yesterday. H/o cholecystectomy, appendix remains. Pinpoints abd pain to RLQ and R flank. Distant h/o kidney stones.

## 2021-11-19 NOTE — ED Provider Notes (Signed)
[facility]    CSN: [id number] Arrival date & time: 11/19/21  0904      History   Chief Complaint Chief Complaint  Patient presents with   Abdominal Pain    HPI [name] OldGina L [name] is a 36 y.o. female.   The history is provided by the patient.  Patient presents with lower abdominal pain that started 1 day ago.  Pain is located in the pelvic region.  States that it radiates to her lower back.  Pain worsens with movement, deep breathing, patient states she had difficulty sleeping last evening.  She is also had episodes of nausea with diarrhea.  She denies urinary frequency, urgency, dysuria, but states that she is "not peeing a lot".  She states that she does have a history of kidney stones, but not sure that she is ever passed one.  She states she has been taking Tylenol and ibuprofen for her symptoms.  Last menstrual cycle was 11/05/2021.  Last bowel movement was today, which she describes as normal. History of cholecystectomy.   Past Medical History:  Diagnosis Date   Carpal tunnel syndrome    Migraine    Obesity     Patient Active Problem List   Diagnosis Date Noted   HSV-2 (herpes simplex virus 2) infection 08/07/2015   Precordial pain 05/31/2013   Obesity    Carpal tunnel syndrome     Past Surgical History:  Procedure Laterality Date   CESAREAN SECTION     CESAREAN SECTION     CHOLECYSTECTOMY     MOUTH SURGERY     TUBAL LIGATION     WISDOM TOOTH EXTRACTION      OB History     Gravida  2   Para  2   Term  2   Preterm      AB      Living  2      SAB      IAB      Ectopic      Multiple      Live Births               Home Medications    Prior to Admission medications   Medication Sig Start Date End Date Taking? Authorizing Provider  acyclovir (ZOVIRAX) 400 MG tablet Take 1 tablet (400 mg total) by mouth 3 (three) times daily. 06/26/21   Donita BrooksPickard, Warren T, MD  metroNIDAZOLE (FLAGYL) 500 MG tablet Take 1 tablet (500 mg total) by  mouth 2 (two) times daily. 06/26/21   Donita BrooksPickard, Warren T, MD  promethazine (PHENERGAN) 25 MG tablet Take 1 tablet (25 mg total) by mouth every 6 (six) hours as needed for nausea or vomiting. 06/16/18 12/07/19  Burgess AmorIdol, Julie, PA-C    Family History Family History  Problem Relation Age of Onset   Diabetes Mother    Hypertension Mother    Hypertension Father     Social History Social History   Tobacco Use   Smoking status: Former    Packs/day: 0.04    Years: 1.50    Pack years: 0.06    Types: Cigarettes    Quit date: 11/21/2010    Years since quitting: 11.0   Smokeless tobacco: Never  Vaping Use   Vaping Use: Never used  Substance Use Topics   Alcohol use: No    Alcohol/week: 0.0 standard drinks   Drug use: No     Allergies   Coconut flavor and Pineapple   Review of Systems Review of  Systems Per HPI  Physical Exam Triage Vital Signs ED Triage Vitals [11/19/21 0926]  Enc Vitals Group     BP 116/85     Pulse Rate 94     Resp 18     Temp 98.6 F (37 C)     Temp Source Oral     SpO2 96 %     Weight 240 lb (108.9 kg)     Height 5\' 7"  (1.702 m)     Head Circumference      Peak Flow      Pain Score 10     Pain Loc      Pain Edu?      Excl. in GC?    No data found.  Updated Vital Signs BP 116/85 (BP Location: Right Arm)   Pulse 94   Temp 98.6 F (37 C) (Oral)   Resp 18   Ht 5\' 7"  (1.702 m)   Wt 240 lb (108.9 kg)   LMP 11/05/2021 (Approximate)   SpO2 96%   BMI 37.59 kg/m   Visual Acuity Right Eye Distance:   Left Eye Distance:   Bilateral Distance:    Right Eye Near:   Left Eye Near:    Bilateral Near:     Physical Exam Vitals and nursing note reviewed.  Constitutional:      Appearance: She is well-developed.     Comments: Tearful  HENT:     Head: Normocephalic.  Cardiovascular:     Rate and Rhythm: Normal rate and regular rhythm.     Heart sounds: Normal heart sounds.  Pulmonary:     Effort: Pulmonary effort is normal.     Breath sounds:  Normal breath sounds.  Abdominal:     General: Bowel sounds are normal. There is no distension.     Palpations: Abdomen is soft.     Tenderness: There is abdominal tenderness in the right lower quadrant, suprapubic area and left lower quadrant. There is no right CVA tenderness or left CVA tenderness.  Skin:    General: Skin is warm and dry.     Capillary Refill: Capillary refill takes less than 2 seconds.  Neurological:     General: No focal deficit present.     Mental Status: She is alert and oriented to person, place, and time.  Psychiatric:        Mood and Affect: Mood normal.        Behavior: Behavior normal.     UC Treatments / Results  Labs (all labs ordered are listed, but only abnormal results are displayed) Labs Reviewed  POCT URINALYSIS DIP (MANUAL ENTRY) - Abnormal; Notable for the following components:      Result Value   Clarity, UA hazy (*)    Bilirubin, UA small (*)    Ketones, POC UA moderate (40) (*)    Blood, UA trace-intact (*)    All other components within normal limits  POCT URINE PREGNANCY    EKG   Radiology DG Abd 1 View  Result Date: 11/19/2021 CLINICAL DATA:  Acute lower abdominal pain. EXAM: ABDOMEN - 1 VIEW COMPARISON:  Nov 06, 2015. FINDINGS: The bowel gas pattern is normal. Status post cholecystectomy. Rounded calcifications are noted in the pelvis most consistent with phleboliths. No definite nephrolithiasis is noted. IMPRESSION: No abnormal bowel dilatation. Electronically Signed   By: 11/21/2021 M.D.   On: 11/19/2021 09:54    Procedures Procedures (including critical care time)  Medications Ordered in UC Medications  ketorolac (  TORADOL) 30 MG/ML injection 30 mg (30 mg Intramuscular Given 11/19/21 1013)    Initial Impression / Assessment and Plan / UC Course  I have reviewed the triage vital signs and the nursing notes.  Pertinent labs & imaging results that were available during my care of the patient were reviewed by me and  considered in my medical decision making (see chart for details).  Patient presents with complaints of abdominal pain.  On exam, she has moderate pelvic pain and tenderness, her urinalysis does show trace blood, urine pregnancy test is negative, and her x-ray does not show any abnormalities. Toradol was given to help with pain. Given her current presentation, I am going to recommend that the patient go to the emergency department for further evaluation. Final Clinical Impressions(s) / UC Diagnoses   Final diagnoses:  Pelvic pain     Discharge Instructions      Go to the ER for further evaluation.     ED Prescriptions   None    PDMP not reviewed this encounter.   Abran Cantor, NP 11/19/21 1023

## 2021-11-19 NOTE — ED Triage Notes (Signed)
Pt to the ED with complaints of lower abdominal pain for the last 2 days. Pt states she was referred here by urgent care.  Pt states UC performed an urinalysis and found some blood in her urine.

## 2021-11-19 NOTE — ED Provider Notes (Signed)
[facility] Provider Note   CSN: [id number] Arrival date & time: 11/19/21  1025     History  Chief Complaint  Patient presents with   Abdominal Pain    Stacy [name] is a 36 y.o. female.   Abdominal Pain Associated symptoms: chills, nausea and vomiting (x 2 yesterday)   Patient presents for abdominal pain.   Her medical history includes migraines, obesity, and carpal tunnel syndrome.  Pain is located in her lower abdomen.  Onset was yesterday.  It is worsened with movements.  She has had associated nausea and diarrhea.  LMP was 2 weeks ago.  LBM was today.  She was seen at urgent care prior to arrival.  While there, she had a negative urine pregnancy test, urinalysis that showed trace hematuria.  She was given Toradol.  Due to symptoms and presence of tenderness, she was advised to come to the ED.  Currently she endorses lower abdominal pain, slightly greater on the right and continued nausea.  She states that she had similar symptoms several weeks ago which resolved on their own.  At that time, symptoms were less severe than currently.    Home Medications Prior to Admission medications   Medication Sig Start Date End Date Taking? Authorizing Provider  amoxicillin-clavulanate (AUGMENTIN) 875-125 MG tablet Take 1 tablet by mouth every 12 (twelve) hours. 11/19/21  Yes Godfrey Pick, MD  HYDROcodone-acetaminophen (NORCO/VICODIN) 5-325 MG tablet Take 1 tablet by mouth every 6 (six) hours as needed for up to 5 days for severe pain. 11/19/21 11/24/21 Yes Godfrey Pick, MD  ondansetron (ZOFRAN-ODT) 4 MG disintegrating tablet Take 1 tablet (4 mg total) by mouth every 8 (eight) hours as needed for nausea or vomiting. 11/19/21  Yes Godfrey Pick, MD  acyclovir (ZOVIRAX) 400 MG tablet Take 1 tablet (400 mg total) by mouth 3 (three) times daily. 06/26/21   Susy Frizzle, MD  promethazine (PHENERGAN) 25 MG tablet Take 1 tablet (25 mg total) by mouth every 6 (six) hours as needed for  nausea or vomiting. 06/16/18 12/07/19  Evalee Jefferson, PA-C      Allergies    Coconut flavor and Pineapple    Review of Systems   Review of Systems  Constitutional:  Positive for chills.  Gastrointestinal:  Positive for abdominal pain, nausea and vomiting (x 2 yesterday).  Genitourinary:  Positive for flank pain.  All other systems reviewed and are negative.  Physical Exam Updated Vital Signs BP (!) 102/57 (BP Location: Right Arm)   Pulse (!) 52   Temp 98.1 F (36.7 C) (Oral)   Resp 18   Ht 5\' 7"  (1.702 m)   Wt 108.9 kg   LMP 11/05/2021 (Approximate)   SpO2 100%   BMI 37.59 kg/m  Physical Exam Vitals and nursing note reviewed.  Constitutional:      General: She is not in acute distress.    Appearance: She is well-developed. She is not ill-appearing, toxic-appearing or diaphoretic.  HENT:     Head: Normocephalic and atraumatic.     Mouth/Throat:     Mouth: Mucous membranes are moist.     Pharynx: Oropharynx is clear.  Eyes:     General: No scleral icterus.    Extraocular Movements: Extraocular movements intact.     Conjunctiva/sclera: Conjunctivae normal.  Cardiovascular:     Rate and Rhythm: Normal rate and regular rhythm.  Pulmonary:     Effort: Pulmonary effort is normal. No respiratory distress.  Abdominal:     Palpations:  Abdomen is soft.     Tenderness: There is generalized abdominal tenderness. There is no right CVA tenderness, left CVA tenderness, guarding or rebound.  Musculoskeletal:        General: No swelling.     Cervical back: Neck supple.  Skin:    General: Skin is warm and dry.     Capillary Refill: Capillary refill takes less than 2 seconds.     Coloration: Skin is not jaundiced or pale.  Neurological:     General: No focal deficit present.     Mental Status: She is alert and oriented to person, place, and time.     Cranial Nerves: No cranial nerve deficit.     Motor: No weakness.  Psychiatric:        Mood and Affect: Mood normal.         Behavior: Behavior normal.    ED Results / Procedures / Treatments   Labs (all labs ordered are listed, but only abnormal results are displayed) Labs Reviewed  URINALYSIS, ROUTINE W REFLEX MICROSCOPIC - Abnormal; Notable for the following components:      Result Value   APPearance HAZY (*)    Hgb urine dipstick SMALL (*)    Ketones, ur 20 (*)    All other components within normal limits  COMPREHENSIVE METABOLIC PANEL - Abnormal; Notable for the following components:   Potassium 2.9 (*)    Calcium 8.4 (*)    Total Bilirubin 2.0 (*)    All other components within normal limits  CBC WITH DIFFERENTIAL/PLATELET - Abnormal; Notable for the following components:   WBC 12.3 (*)    MCV 77.5 (*)    MCH 25.1 (*)    Neutro Abs 8.8 (*)    All other components within normal limits  PREGNANCY, URINE  LIPASE, BLOOD    EKG None  Radiology DG Abd 1 View  Result Date: 11/19/2021 CLINICAL DATA:  Acute lower abdominal pain. EXAM: ABDOMEN - 1 VIEW COMPARISON:  Nov 06, 2015. FINDINGS: The bowel gas pattern is normal. Status post cholecystectomy. Rounded calcifications are noted in the pelvis most consistent with phleboliths. No definite nephrolithiasis is noted. IMPRESSION: No abnormal bowel dilatation. Electronically Signed   By: Marijo Conception M.D.   On: 11/19/2021 09:54   CT ABDOMEN PELVIS W CONTRAST  Result Date: 11/19/2021 CLINICAL DATA:  Abdominal pain, flank pain, RIGHT lower quadrant pain, nausea, and vomiting, diarrhea, question kidney stone EXAM: CT ABDOMEN AND PELVIS WITH CONTRAST TECHNIQUE: Multidetector CT imaging of the abdomen and pelvis was performed using the standard protocol following bolus administration of intravenous contrast. RADIATION DOSE REDUCTION: This exam was performed according to the departmental dose-optimization program which includes automated exposure control, adjustment of the mA and/or kV according to patient size and/or use of iterative reconstruction technique.  CONTRAST:  159mL OMNIPAQUE IOHEXOL 300 MG/ML SOLN IV. No oral contrast. COMPARISON:  01/12/2016 FINDINGS: Lower chest: Dependent bibasilar atelectasis Hepatobiliary: Gallbladder surgically absent. Fatty infiltration of liver. No focal hepatic abnormalities. Pancreas: Normal appearance Spleen: Calcified granuloma within spleen. Otherwise normal appearance. Small splenule medial to spleen. Adrenals/Urinary Tract: Adrenal glands, kidneys, ureters, and bladder normal appearance Stomach/Bowel: Normal appendix. Diverticulosis of descending and sigmoid colon with wall thickening and pericolic inflammatory changes of the proximal sigmoid colon consistent with acute diverticulitis. Stranding of sigmoid mesocolon extending into LEFT pelvis. Minimal stranding and prevesical fat. No extraluminal gas, perforation, or abscess. Scattered uncomplicated diverticula of ascending and transverse colon. Remaining bowel loops unremarkable. Stomach normal appearance. Vascular/Lymphatic:  Aorta normal caliber. Vascular structures patent. No adenopathy. Reproductive: Unremarkable uterus and ovaries Other: Minimal free fluid in pelvis.  No free air.  No hernia. Musculoskeletal: BILATERAL spondylolysis L5 without spondylolisthesis. IMPRESSION: Acute sigmoid diverticulitis without evidence of perforation or abscess. Fatty infiltration of liver. BILATERAL spondylolysis L5 without spondylolisthesis. Electronically Signed   By: Lavonia Dana M.D.   On: 11/19/2021 14:32    Procedures Procedures    Medications Ordered in ED Medications  lactated ringers bolus 1,000 mL (0 mLs Intravenous Stopped 11/19/21 1440)  ondansetron (ZOFRAN) injection 4 mg (4 mg Intravenous Given 11/19/21 1240)  HYDROmorphone (DILAUDID) injection 0.5 mg (0.5 mg Intravenous Given 11/19/21 1240)  potassium chloride SA (KLOR-CON M) CR tablet 40 mEq (40 mEq Oral Given 11/19/21 1439)  iohexol (OMNIPAQUE) 300 MG/ML solution 100 mL (100 mLs Intravenous Contrast Given 11/19/21  1410)    ED Course/ Medical Decision Making/ A&P                           Medical Decision Making Amount and/or Complexity of Data Reviewed Labs: ordered. Radiology: ordered.  Risk Prescription drug management.   This patient presents to the ED for concern of abdominal pain, this involves an extensive number of treatment options, and is a complaint that carries with it a high risk of complications and morbidity.  The differential diagnosis includes colitis, diverticulitis, constipation, ruptured ovarian cyst, appendicitis   Co morbidities that complicate the patient evaluation  migraines, obesity, and carpal tunnel syndrome   Additional history obtained:  Additional history obtained from N/A External records from outside source obtained and reviewed including EMR   Lab Tests:  I Ordered, and personally interpreted labs.  The pertinent results include: Leukocytosis, hypokalemia with otherwise normal electrolytes, no evidence of UTI   Imaging Studies ordered:  I ordered imaging studies including CT of abdomen and pelvis I independently visualized and interpreted imaging which showed uncomplicated sigmoid diverticulitis I agree with the radiologist interpretation   Cardiac Monitoring: / EKG:  The patient was maintained on a cardiac monitor.  I personally viewed and interpreted the cardiac monitored which showed an underlying rhythm of: Sinus rhythm   Problem List / ED Course / Critical interventions / Medication management  Patient is a healthy 36 year old female who presents for lower abdominal pain, nausea, and chills since yesterday.  Had 2 episodes of vomiting yesterday.  She has also had diarrhea.  On arrival in the ED, her vital signs are normal.  She is overall well-appearing on exam.  She does have tenderness throughout her lower abdomen.  IV fluids, Zofran, and Dilaudid were ordered for symptomatic relief.  Laboratory work-up was initiated.  Patient's lab work  is notable for leukocytosis and hypokalemia.  Replacement potassium was ordered.  Patient underwent CT scan of abdomen pelvis which did show uncomplicated sigmoid diverticulitis.  On reassessment, patient had improved symptoms.  Nausea has resolved.  Patient was prescribed as needed Zofran and Norco for symptomatic relief at home.  She was also given prescription for Augmentin.  She was encouraged to return to the ED if she does have worsening symptoms.  She was discharged in good condition. I ordered medication including Zofran for nausea; Dilaudid for analgesia, potassium chloride for hypokalemia; IVF for hydration Reevaluation of the patient after these medicines showed that the patient resolved I have reviewed the patients home medicines and have made adjustments as needed   Social Determinants of Health:  Lives independently  Final Clinical Impression(s) / ED Diagnoses Final diagnoses:  Diverticulitis    Rx / DC Orders ED Discharge Orders          Ordered    ondansetron (ZOFRAN-ODT) 4 MG disintegrating tablet  Every 8 hours PRN        11/19/21 1509    HYDROcodone-acetaminophen (NORCO/VICODIN) 5-325 MG tablet  Every 6 hours PRN        11/19/21 1509    amoxicillin-clavulanate (AUGMENTIN) 875-125 MG tablet  Every 12 hours        11/19/21 1509              Godfrey Pick, MD 11/19/21 1721

## 2021-11-19 NOTE — ED Notes (Signed)
Patient is being discharged from the Urgent Care and sent to the Emergency Department via POV . Per NP, patient is in need of higher level of care due to abd pain. Patient is aware and verbalizes understanding of plan of care.  Vitals:   11/19/21 0926  BP: 116/85  Pulse: 94  Resp: 18  Temp: 98.6 F (37 C)  SpO2: 96%

## 2021-12-04 ENCOUNTER — Ambulatory Visit: Payer: Self-pay | Admitting: Family Medicine

## 2021-12-09 ENCOUNTER — Emergency Department (HOSPITAL_COMMUNITY)
Admission: EM | Admit: 2021-12-09 | Discharge: 2021-12-09 | Disposition: A | Discharge status: Home / Self Care | Payer: 59 | Attending: Emergency Medicine | Admitting: Emergency Medicine

## 2021-12-09 ENCOUNTER — Encounter (HOSPITAL_COMMUNITY): Payer: Self-pay

## 2021-12-09 ENCOUNTER — Other Ambulatory Visit: Payer: Self-pay

## 2021-12-09 ENCOUNTER — Emergency Department (HOSPITAL_COMMUNITY): Payer: 59

## 2021-12-09 DIAGNOSIS — D649 Anemia, unspecified: Secondary | ICD-10-CM | POA: Insufficient documentation

## 2021-12-09 DIAGNOSIS — E876 Hypokalemia: Secondary | ICD-10-CM | POA: Insufficient documentation

## 2021-12-09 DIAGNOSIS — R7309 Other abnormal glucose: Secondary | ICD-10-CM | POA: Insufficient documentation

## 2021-12-09 DIAGNOSIS — K5790 Diverticulosis of intestine, part unspecified, without perforation or abscess without bleeding: Secondary | ICD-10-CM | POA: Insufficient documentation

## 2021-12-09 DIAGNOSIS — R1032 Left lower quadrant pain: Secondary | ICD-10-CM | POA: Diagnosis present

## 2021-12-09 DIAGNOSIS — K5792 Diverticulitis of intestine, part unspecified, without perforation or abscess without bleeding: Secondary | ICD-10-CM

## 2021-12-09 HISTORY — DX: Diverticulosis of intestine, part unspecified, without perforation or abscess without bleeding: K57.90

## 2021-12-09 LAB — POC URINE PREG, ED: Preg Test, Ur: NEGATIVE

## 2021-12-09 LAB — COMPREHENSIVE METABOLIC PANEL
ALT: 18 U/L (ref 0–44)
AST: 18 U/L (ref 15–41)
Albumin: 3.7 g/dL (ref 3.5–5.0)
Alkaline Phosphatase: 38 U/L (ref 38–126)
Anion gap: 5 (ref 5–15)
BUN: 9 mg/dL (ref 6–20)
CO2: 25 mmol/L (ref 22–32)
Calcium: 8.6 mg/dL — ABNORMAL LOW (ref 8.9–10.3)
Chloride: 108 mmol/L (ref 98–111)
Creatinine, Ser: 0.57 mg/dL (ref 0.44–1.00)
GFR, Estimated: >60 mL/min (ref >60)
Glucose, Bld: 107 mg/dL — ABNORMAL HIGH (ref 70–99)
Potassium: 3.3 mmol/L — ABNORMAL LOW (ref 3.5–5.1)
Sodium: 138 mmol/L (ref 135–145)
Total Bilirubin: 0.8 mg/dL (ref 0.3–1.2)
Total Protein: 7.2 g/dL (ref 6.5–8.1)

## 2021-12-09 LAB — URINALYSIS, ROUTINE W REFLEX MICROSCOPIC
Bilirubin Urine: NEGATIVE
Glucose, UA: NEGATIVE mg/dL
Ketones, ur: NEGATIVE mg/dL
Leukocytes,Ua: NEGATIVE
Nitrite: NEGATIVE
Protein, ur: NEGATIVE mg/dL
Specific Gravity, Urine: 1.016 (ref 1.005–1.030)
pH: 6 (ref 5.0–8.0)

## 2021-12-09 LAB — CBC WITH DIFFERENTIAL/PLATELET
Abs Immature Granulocytes: 0.02 10*3/uL (ref 0.00–0.07)
Basophils Absolute: 0 10*3/uL (ref 0.0–0.1)
Basophils Relative: 1 %
Eosinophils Absolute: 0.2 10*3/uL (ref 0.0–0.5)
Eosinophils Relative: 3 %
HCT: 35 % — ABNORMAL LOW (ref 36.0–46.0)
Hemoglobin: 11.5 g/dL — ABNORMAL LOW (ref 12.0–15.0)
Immature Granulocytes: 0 %
Lymphocytes Relative: 34 %
Lymphs Abs: 2.3 10*3/uL (ref 0.7–4.0)
MCH: 25.7 pg — ABNORMAL LOW (ref 26.0–34.0)
MCHC: 32.9 g/dL (ref 30.0–36.0)
MCV: 78.3 fL — ABNORMAL LOW (ref 80.0–100.0)
Monocytes Absolute: 0.3 10*3/uL (ref 0.1–1.0)
Monocytes Relative: 4 %
Neutro Abs: 4 10*3/uL (ref 1.7–7.7)
Neutrophils Relative %: 58 %
Platelets: 277 10*3/uL (ref 150–400)
RBC: 4.47 MIL/uL (ref 3.87–5.11)
RDW: 13.8 % (ref 11.5–15.5)
WBC: 6.8 10*3/uL (ref 4.0–10.5)
nRBC: 0 % (ref 0.0–0.2)

## 2021-12-09 LAB — LIPASE, BLOOD: Lipase: 24 U/L (ref 11–51)

## 2021-12-09 MED ORDER — POTASSIUM CHLORIDE CRYS ER 20 MEQ PO TBCR
20.0000 meq | EXTENDED_RELEASE_TABLET | Freq: Once | ORAL | Status: AC
  Administered 2021-12-09: 20 meq via ORAL
  Filled 2021-12-09: qty 1

## 2021-12-09 MED ORDER — CIPROFLOXACIN HCL 500 MG PO TABS: 500.0000 mg | ORAL_TABLET | Freq: Two times a day (BID) | ORAL | 0 refills | Status: DC

## 2021-12-09 MED ORDER — IOHEXOL 300 MG/ML  SOLN
100.0000 mL | Freq: Once | INTRAMUSCULAR | Status: AC | PRN
  Administered 2021-12-09: 100 mL via INTRAVENOUS

## 2021-12-09 MED ORDER — ONDANSETRON HCL 4 MG PO TABS: 4.0000 mg | ORAL_TABLET | Freq: Four times a day (QID) | ORAL | 0 refills | Status: DC

## 2021-12-09 MED ORDER — METRONIDAZOLE 500 MG PO TABS: 500.0000 mg | ORAL_TABLET | Freq: Three times a day (TID) | ORAL | 0 refills | Status: AC

## 2021-12-09 NOTE — ED Provider Triage Note (Signed)
Emergency Medicine Provider Triage Evaluation Note  Stacy [name] , a 36 y.o. female  was evaluated in triage.  Pt complains of worsening abdominal pain.  The weeks ago, patient was diagnosed with diverticulitis and was placed on antibiotic.  Since then, she has reported worsening pain.  She reports some nausea without any vomiting.  Denies any fevers, chest pain, shortness of breath.  Review of Systems  Positive:  Negative:   Physical Exam  BP (!) 144/83 (BP Location: Right Arm)   Pulse 68   Temp 98 F (36.7 C) (Oral)   Resp 20   Ht 5\' 7"  (1.702 m)   Wt 108.9 kg   LMP 12/03/2021 (Approximate)   SpO2 96%   BMI 37.59 kg/m  Gen:   Awake, no distress   Resp:  Normal effort  MSK:   Moves extremities without difficulty  Other:  Abdominal exam limited secondary to body habitus although abdomen is soft without any peritoneal signs.  Medical Decision Making  Medically screening exam initiated at 12:11 PM.  Appropriate orders placed.  Stacy [name] was informed that the remainder of the evaluation will be completed by another provider, this initial triage assessment does not replace that evaluation, and the importance of remaining in the ED until their evaluation is complete.  We will order basic labs and CT imaging to survey possible worsening diverticulitis.   Morrison Old, PA-C 12/09/21 1211

## 2021-12-09 NOTE — ED Provider Notes (Signed)
[facility] EMERGENCY DEPARTMENT Provider Note   CSN: [id number] Arrival date & time: 12/09/21  1153     History Chief Complaint  Patient presents with   Abdominal Pain    Stacy [name] is a 36 y.o. female with history of diverticulosis presents to the emergency department for evaluation of left lower quadrant pain.  Patient reports that around 3 weeks ago she was diagnosed with diverticulitis and put on Augmentin.  She reports she was compliant with the medication and took as prescribed.  She reports she felt better for around a week afterwards but for the past 4 days has been having worsening left lower quadrant pain.  She reports some nausea, but denies any vomiting.  She denies any melena or any hematochezia.  Denies any fever, chest pain, shortness of breath, dysuria, hematuria, vaginal bleeding, or vaginal discharge.  Denies any diarrhea, or constipation.  She reports that she has a follow-up appointment with her primary care doctor on Friday for a referral to a GI practice.  Surgical history does include cholecystectomy and C-sections x2.   Abdominal Pain Associated symptoms: nausea   Associated symptoms: no chest pain, no chills, no constipation, no diarrhea, no dysuria, no fever, no hematuria, no shortness of breath, no vaginal bleeding, no vaginal discharge and no vomiting        Home Medications Prior to Admission medications   Medication Sig Start Date End Date Taking? Authorizing Provider  ondansetron (ZOFRAN-ODT) 4 MG disintegrating tablet Take 1 tablet (4 mg total) by mouth every 8 (eight) hours as needed for nausea or vomiting. 11/19/21  Yes Gloris Manchester, MD  promethazine (PHENERGAN) 25 MG tablet Take 1 tablet (25 mg total) by mouth every 6 (six) hours as needed for nausea or vomiting. 06/16/18 12/07/19  Burgess Amor, PA-C      Allergies    Coconut flavor and Pineapple    Review of Systems   Review of Systems  Constitutional:  Negative for chills and fever.   Respiratory:  Negative for shortness of breath.   Cardiovascular:  Negative for chest pain.  Gastrointestinal:  Positive for abdominal pain and nausea. Negative for blood in stool, constipation, diarrhea and vomiting.  Genitourinary:  Negative for dysuria, frequency, hematuria, urgency, vaginal bleeding and vaginal discharge.    Physical Exam Updated Vital Signs BP (!) 131/54 (BP Location: Right Arm)   Pulse (!) 47   Temp 98 F (36.7 C) (Oral)   Resp 16   Ht 5\' 7"  (1.702 m)   Wt 108.9 kg   LMP 12/03/2021 (Approximate)   SpO2 98%   BMI 37.59 kg/m  Physical Exam Vitals and nursing note reviewed.  Constitutional:      General: She is not in acute distress.    Appearance: Normal appearance. She is not toxic-appearing.  HENT:     Head: Normocephalic and atraumatic.  Eyes:     General: No scleral icterus. Cardiovascular:     Rate and Rhythm: Normal rate and regular rhythm.  Pulmonary:     Effort: Pulmonary effort is normal.     Breath sounds: Normal breath sounds.  Abdominal:     General: Bowel sounds are normal.     Palpations: Abdomen is soft.     Tenderness: There is abdominal tenderness in the left lower quadrant. There is no right CVA tenderness, left CVA tenderness, guarding or rebound.     Comments: Abdominal exam limited secondary to body habitus.  Patient abdomen is soft with normal active bowel sounds.  She does have some tenderness to the left lower quadrant and suprapubic area.  No guarding or rebound noted.  No CVA tenderness bilaterally  Musculoskeletal:        General: No deformity.     Cervical back: Normal range of motion.  Skin:    General: Skin is warm and dry.  Neurological:     General: No focal deficit present.     Mental Status: She is alert. Mental status is at baseline.     ED Results / Procedures / Treatments   Labs (all labs ordered are listed, but only abnormal results are displayed) Labs Reviewed  COMPREHENSIVE METABOLIC PANEL - Abnormal;  Notable for the following components:      Result Value   Potassium 3.3 (*)    Glucose, Bld 107 (*)    Calcium 8.6 (*)    All other components within normal limits  URINALYSIS, ROUTINE W REFLEX MICROSCOPIC - Abnormal; Notable for the following components:   Hgb urine dipstick SMALL (*)    Bacteria, UA RARE (*)    All other components within normal limits  CBC WITH DIFFERENTIAL/PLATELET - Abnormal; Notable for the following components:   Hemoglobin 11.5 (*)    HCT 35.0 (*)    MCV 78.3 (*)    MCH 25.7 (*)    All other components within normal limits  LIPASE, BLOOD  POC URINE PREG, ED    EKG None  Radiology CT ABDOMEN PELVIS W CONTRAST  Result Date: 12/09/2021 CLINICAL DATA:  LEFT lower quadrant pain. Pt c/o worsening abdominal pain with nausea after being diagnosed with diverticulitis and given antibiotics three weeks ago EXAM: CT ABDOMEN AND PELVIS WITH CONTRAST TECHNIQUE: Multidetector CT imaging of the abdomen and pelvis was performed using the standard protocol following bolus administration of intravenous contrast. RADIATION DOSE REDUCTION: This exam was performed according to the departmental dose-optimization program which includes automated exposure control, adjustment of the mA and/or kV according to patient size and/or use of iterative reconstruction technique. CONTRAST:  123mL OMNIPAQUE IOHEXOL 300 MG/ML  SOLN COMPARISON:  CT AP, 11/19/2021 and 12/13/2015. FINDINGS: Lower chest: No acute abnormality. Hepatobiliary: No focal liver abnormality is seen. Status post cholecystectomy. No biliary dilatation. Pancreas: No pancreatic ductal dilatation or surrounding inflammatory changes. Spleen: Normal in size without focal abnormality. Subcentimeter, peripheral midpolar splenic hypodense lesion too small to adequately characterize though likely a small hemangioma. Punctate splenic calcification. Small perihilar accessory spleen. Adrenals/Urinary Tract: Adrenal glands are unremarkable.  Kidneys are normal, without renal calculi, focal lesion, or hydronephrosis. Bladder is unremarkable. Stomach/Bowel: Stomach is within normal limits. Appendix appears normal. Improved appearance of distal descending and proximal sigmoid colon with decreased pericolonic stranding. No focal drainable collection or abscess. Vascular/Lymphatic: No significant vascular findings are present. No enlarged abdominal or pelvic lymph nodes. Reproductive: Uterus and adnexa are unremarkable. Other: No abdominal wall hernia or abnormality. No abdominopelvic ascites. Musculoskeletal: Obese.  No acute or significant osseous findings. IMPRESSION: Since CT AP dated 11/19/2021; Acute however resolving sigmoid diverticulitis, improved in appearance with decreased pericolonic stranding. No focal drainable collection or abscess. Electronically Signed   By: Michaelle Birks M.D.   On: 12/09/2021 13:47    Procedures Procedures   Medications Ordered in ED Medications  iohexol (OMNIPAQUE) 300 MG/ML solution 100 mL (100 mLs Intravenous Contrast Given 12/09/21 1337)    ED Course/ Medical Decision Making/ A&P  Medical Decision Making Amount and/or Complexity of Data Reviewed Labs: ordered. Radiology: ordered.  Risk Prescription drug management.   36 year old female presents emerged department for evaluation of left lower quadrant pain with nausea.  Differential diagnosis includes but is not limited to diverticulosis, diverticulitis, abscess, perforation, small bowel obstruction, constipation, gas pain.  Vital signs show normotensive, afebrile, satting well room air without increased work of breathing.  Patient is bradycardic but is in a history dating back to 2021.  Physical exam is pertinent for a well-appearing no acute distress female.  Regular rate and rhythm.  Lungs are clear to auscultation bilaterally.  She has some left lower quadrant abdominal tenderness without any guarding or rebound.  Abdomen  is soft with normal active bowel sounds.  Given that the patient recently diagnosed with diverticulitis and has had improvement but then worsening of pain, will repeat imaging as well as some basic lab work.  I independently reviewed and interpreted the patient's labs.  He shows mild decrease in potassium 3.3 although this is improvement patient's last CMP.  Glucose slightly elevated at 107 although not fasting.  Mild decrease in calcium at 8.6.  CBC shows mild anemia with a hemoglobin 11.5 this appears to be around patient's baseline.  No leukocytosis.  Lipase normal.  Urine pregnancy negative.  Urinalysis with small amount of hemoglobin with 0-5 red blood cells rare bacteria but 0-5 white blood cells seen.  CT abdomen pelvis with contrast show since CT AP dated 11/19/2021; Acute however resolving sigmoid diverticulitis, improved in appearance with decreased pericolonic stranding. No focal drainable collection or abscess.  Patient has persistently low potassium, will replenish orally here.  Recommend the patient follow-up with her PCP for reevaluation.  Patient has appointment on Friday.  We will place patient on Cipro as well as Flagyl for combination therapy for diverticulitis given her continuing pain and only improving CT scan.  Additionally, we will send her home on a few more Zofran pills.  Patient does not have a white count, her vital signs are stable for her, I do think this patient is safe for discharge with outpatient follow-up.  She has a doctor's appointment on Friday and they are scheduling her with a GI doctor then.  We discussed strict return precautions and red flag symptoms.  Patient verbalized understanding and agrees to plan.  Patient is stable and being discharged home in good condition.  I discussed this case with my attending physician who cosigned this note including patient's presenting symptoms, physical exam, and planned diagnostics and interventions. Attending physician stated  agreement with plan or made changes to plan which were implemented.   Final Clinical Impression(s) / ED Diagnoses Final diagnoses:  Diverticulitis  Hypokalemia    Rx / DC Orders ED Discharge Orders          Ordered    ciprofloxacin (CIPRO) 500 MG tablet  Every 12 hours        12/09/21 1656    metroNIDAZOLE (FLAGYL) 500 MG tablet  3 times daily        12/09/21 1656    ondansetron (ZOFRAN) 4 MG tablet  Every 6 hours        12/09/21 1656              Achille Rich, PA-C 12/09/21 1656    Eber Hong, MD 12/10/21 1112

## 2021-12-09 NOTE — Discharge Instructions (Addendum)
You were seen here today for evaluation of your continued abdominal pain.  Your CT scan showed a continuation or minimal improvement of your diverticulitis.  For this, I have placed you on 2 new antibiotics.  Please do not drink alcohol on these antibiotics as it can make you severely ill.  These medications will likely cause diarrhea, I recommend taking them with a probiotic that he can pick up over-the-counter.  Additionally, you have had persistently low potassiums on your blood work.  I would follow-up with this at her PCP office on Friday.  If you have any concern, new or worsening symptoms, please return the nearest emergency department for evaluation.  Contact a doctor if: Your pain does not get better. You are not pooping like normal. Get help right away if: Your pain gets worse. Your symptoms do not get better. Your symptoms get worse very fast. You have a fever. You vomit more than one time. You have poop that is: Bloody. Black. Tarry.

## 2021-12-09 NOTE — ED Triage Notes (Signed)
Pt states she was dx with diverticulitis 3 weeks ago. Pt was treated with abx and pain temporarily went away, but now the pain is back and is worse. Pt reports associated nausea, decreased appetite, and diarrhea.

## 2021-12-12 ENCOUNTER — Ambulatory Visit (INDEPENDENT_AMBULATORY_CARE_PROVIDER_SITE_OTHER): Payer: 59 | Admitting: Family Medicine

## 2021-12-12 ENCOUNTER — Encounter: Payer: Self-pay | Admitting: Family Medicine

## 2021-12-12 VITALS — BP 144/90 | HR 80 | Temp 98.1°F | Resp 18 | Wt 244.0 lb

## 2021-12-12 DIAGNOSIS — K5792 Diverticulitis of intestine, part unspecified, without perforation or abscess without bleeding: Secondary | ICD-10-CM

## 2021-12-12 DIAGNOSIS — F321 Major depressive disorder, single episode, moderate: Secondary | ICD-10-CM

## 2021-12-12 MED ORDER — ESCITALOPRAM OXALATE 10 MG PO TABS: 10.0000 mg | ORAL_TABLET | Freq: Every day | ORAL | 5 refills | Status: DC

## 2021-12-13 LAB — CBC WITH DIFFERENTIAL/PLATELET
Absolute Monocytes: 462 cells/uL (ref 200–950)
Basophils Absolute: 28 cells/uL (ref 0–200)
Basophils Relative: 0.4 %
Eosinophils Absolute: 234 cells/uL (ref 15–500)
Eosinophils Relative: 3.3 %
HCT: 39.3 % (ref 35.0–45.0)
Hemoglobin: 12.4 g/dL (ref 11.7–15.5)
Lymphs Abs: 2513 cells/uL (ref 850–3900)
MCH: 25.3 pg — ABNORMAL LOW (ref 27.0–33.0)
MCHC: 31.6 g/dL — ABNORMAL LOW (ref 32.0–36.0)
MCV: 80 fL (ref 80.0–100.0)
MPV: 9.8 fL (ref 7.5–12.5)
Monocytes Relative: 6.5 %
Neutro Abs: 3862 cells/uL (ref 1500–7800)
Neutrophils Relative %: 54.4 %
Platelets: 294 10*3/uL (ref 140–400)
RBC: 4.91 10*6/uL (ref 3.80–5.10)
RDW: 14.1 % (ref 11.0–15.0)
Total Lymphocyte: 35.4 %
WBC: 7.1 10*3/uL (ref 3.8–10.8)

## 2021-12-13 LAB — BASIC METABOLIC PANEL WITH GFR
BUN: 11 mg/dL (ref 7–25)
CO2: 25 mmol/L (ref 20–32)
Calcium: 9 mg/dL (ref 8.6–10.2)
Chloride: 108 mmol/L (ref 98–110)
Creat: 0.62 mg/dL (ref 0.50–0.97)
Glucose, Bld: 101 mg/dL — ABNORMAL HIGH (ref 65–99)
Potassium: 4.1 mmol/L (ref 3.5–5.3)
Sodium: 140 mmol/L (ref 135–146)
eGFR: 118 mL/min/{1.73_m2} (ref >=60)

## 2022-01-24 ENCOUNTER — Encounter (HOSPITAL_COMMUNITY): Payer: Self-pay

## 2022-01-24 ENCOUNTER — Emergency Department (HOSPITAL_COMMUNITY)
Admission: EM | Admit: 2022-01-24 | Discharge: 2022-01-25 | Disposition: A | Discharge status: Home / Self Care | Payer: 59 | Attending: Student | Admitting: Student

## 2022-01-24 ENCOUNTER — Emergency Department (HOSPITAL_COMMUNITY): Payer: 59

## 2022-01-24 DIAGNOSIS — E876 Hypokalemia: Secondary | ICD-10-CM | POA: Diagnosis not present

## 2022-01-24 DIAGNOSIS — Z87891 Personal history of nicotine dependence: Secondary | ICD-10-CM | POA: Diagnosis not present

## 2022-01-24 DIAGNOSIS — K5792 Diverticulitis of intestine, part unspecified, without perforation or abscess without bleeding: Secondary | ICD-10-CM | POA: Diagnosis not present

## 2022-01-24 DIAGNOSIS — R1032 Left lower quadrant pain: Secondary | ICD-10-CM | POA: Diagnosis present

## 2022-01-24 LAB — COMPREHENSIVE METABOLIC PANEL
ALT: 17 U/L (ref 0–44)
AST: 17 U/L (ref 15–41)
Albumin: 3.6 g/dL (ref 3.5–5.0)
Alkaline Phosphatase: 42 U/L (ref 38–126)
Anion gap: 4 — ABNORMAL LOW (ref 5–15)
BUN: 11 mg/dL (ref 6–20)
CO2: 26 mmol/L (ref 22–32)
Calcium: 8.7 mg/dL — ABNORMAL LOW (ref 8.9–10.3)
Chloride: 110 mmol/L (ref 98–111)
Creatinine, Ser: 0.54 mg/dL (ref 0.44–1.00)
GFR, Estimated: >60 mL/min (ref >60)
Glucose, Bld: 103 mg/dL — ABNORMAL HIGH (ref 70–99)
Potassium: 3.3 mmol/L — ABNORMAL LOW (ref 3.5–5.1)
Sodium: 140 mmol/L (ref 135–145)
Total Bilirubin: 0.8 mg/dL (ref 0.3–1.2)
Total Protein: 7.1 g/dL (ref 6.5–8.1)

## 2022-01-24 LAB — CBC
HCT: 35.4 % — ABNORMAL LOW (ref 36.0–46.0)
Hemoglobin: 11.5 g/dL — ABNORMAL LOW (ref 12.0–15.0)
MCH: 25.4 pg — ABNORMAL LOW (ref 26.0–34.0)
MCHC: 32.5 g/dL (ref 30.0–36.0)
MCV: 78.3 fL — ABNORMAL LOW (ref 80.0–100.0)
Platelets: 287 10*3/uL (ref 150–400)
RBC: 4.52 MIL/uL (ref 3.87–5.11)
RDW: 13.8 % (ref 11.5–15.5)
WBC: 8.2 10*3/uL (ref 4.0–10.5)
nRBC: 0 % (ref 0.0–0.2)

## 2022-01-24 LAB — URINALYSIS, ROUTINE W REFLEX MICROSCOPIC
Bilirubin Urine: NEGATIVE
Glucose, UA: NEGATIVE mg/dL
Hgb urine dipstick: NEGATIVE
Ketones, ur: NEGATIVE mg/dL
Leukocytes,Ua: NEGATIVE
Nitrite: NEGATIVE
Protein, ur: NEGATIVE mg/dL
Specific Gravity, Urine: 1.028 (ref 1.005–1.030)
pH: 5 (ref 5.0–8.0)

## 2022-01-24 LAB — LIPASE, BLOOD: Lipase: 25 U/L (ref 11–51)

## 2022-01-24 MED ORDER — ONDANSETRON HCL 4 MG/2ML IJ SOLN
4.0000 mg | Freq: Once | INTRAMUSCULAR | Status: AC
Start: 2022-01-24 — End: 2022-01-24
  Administered 2022-01-24: 4 mg via INTRAVENOUS
  Filled 2022-01-24: qty 2

## 2022-01-24 MED ORDER — MORPHINE SULFATE (PF) 4 MG/ML IV SOLN
4.0000 mg | Freq: Once | INTRAVENOUS | Status: AC
  Administered 2022-01-24: 4 mg via INTRAVENOUS
  Filled 2022-01-24: qty 1

## 2022-01-24 MED ORDER — IOHEXOL 300 MG/ML  SOLN
100.0000 mL | Freq: Once | INTRAMUSCULAR | Status: AC | PRN
  Administered 2022-01-25: 100 mL via INTRAVENOUS

## 2022-01-24 MED ORDER — LACTATED RINGERS IV BOLUS
1000.0000 mL | Freq: Once | INTRAVENOUS | Status: AC
  Administered 2022-01-24: 1000 mL via INTRAVENOUS

## 2022-01-24 NOTE — ED Triage Notes (Signed)
Pt states that she is having a flair up of her diverticulitis. Pt c/o left sided abd pain that has now spread throughout abd. Pt endorses nausea.

## 2022-01-24 NOTE — ED Provider Notes (Signed)
  Provider Note MRN:  [id number]  Arrival date & time: 01/25/22    ED Course and Medical Decision Making  Assumed care from Dr. Posey Rea at shift change.  Suspect diverticulitis awaiting CT imaging.  12:46 AM update: CT scan does demonstrate acute uncomplicated diverticulitis.  Is well-appearing with normal vital signs on my evaluation, she is comfortable with discharge and management at home.  We will follow-up with her primary care doctor.  Procedures  Final Clinical Impressions(s) / ED Diagnoses     ICD-10-CM   1. Diverticulitis  K57.92       ED Discharge Orders          Ordered    amoxicillin-clavulanate (AUGMENTIN) 875-125 MG tablet  Every 12 hours        01/25/22 0046    naproxen (NAPROSYN) 500 MG tablet  2 times daily        01/25/22 0046    oxyCODONE (ROXICODONE) 5 MG immediate release tablet  Every 4 hours PRN        01/25/22 0046              Discharge Instructions      You were evaluated in the Emergency Department and after careful evaluation, we did not find any emergent condition requiring admission or further testing in the hospital.  Your exam/testing today was overall reassuring.  Symptoms seem to be due to diverticulitis.  Take the Augmentin antibiotic as directed.  Use the Naprosyn twice daily for pain.  Can use the oxycodone for more significant pain.  Please return to the Emergency Department if you experience any worsening of your condition.  Thank you for allowing [place] to be a part of your care.       Elmer Sow. Pilar Plate, MD [facility] Emergency Medicine [facility] [email or web address]    Sabas Sous, MD 01/25/22 [phone]

## 2022-01-25 MED ORDER — OXYCODONE HCL 5 MG PO TABS: 5.0000 mg | ORAL_TABLET | ORAL | 0 refills | Status: DC | PRN

## 2022-01-25 MED ORDER — AMOXICILLIN-POT CLAVULANATE 875-125 MG PO TABS
1.0000 | ORAL_TABLET | Freq: Once | ORAL | Status: AC
  Administered 2022-01-25: 1 via ORAL
  Filled 2022-01-25: qty 1

## 2022-01-25 MED ORDER — NAPROXEN 500 MG PO TABS: 500.0000 mg | ORAL_TABLET | Freq: Two times a day (BID) | ORAL | 0 refills | Status: DC

## 2022-01-25 MED ORDER — AMOXICILLIN-POT CLAVULANATE 875-125 MG PO TABS: 1.0000 | ORAL_TABLET | Freq: Two times a day (BID) | ORAL | 0 refills | Status: AC

## 2022-01-25 MED ORDER — MORPHINE SULFATE (PF) 4 MG/ML IV SOLN
4.0000 mg | Freq: Once | INTRAVENOUS | Status: AC
  Administered 2022-01-25: 4 mg via INTRAVENOUS
  Filled 2022-01-25: qty 1

## 2022-01-25 NOTE — ED Provider Notes (Signed)
[facility] EMERGENCY DEPARTMENT Provider Note  CSN: [id number] Arrival date & time: 01/24/22 1835  Chief Complaint(s) Abdominal Pain  HPI Stacy [name] is a 36 y.o. female with PMH diverticulitis, diverticulosis who presents emergency department for evaluation of abdominal pain.  Patient has had multiple ER presentations including on 11/19/2021 and 12/09/2021 for diverticulitis and states that this feels very similar to this.  She states her pain has been going on for the last few days, started in the left lower quadrant but now has migrated over to the right lower quadrant.  She endorses associated nausea but denies chest pain, shortness of breath, headache, fever or other systemic symptoms.    Past Medical History Past Medical History:  Diagnosis Date   Carpal tunnel syndrome    Diverticulosis    Migraine    Obesity    Patient Active Problem List   Diagnosis Date Noted   HSV-2 (herpes simplex virus 2) infection 08/07/2015   Precordial pain 05/31/2013   Obesity    Carpal tunnel syndrome    Home Medication(s) Prior to Admission medications   Medication Sig Start Date End Date Taking? Authorizing Provider  amoxicillin-clavulanate (AUGMENTIN) 875-125 MG tablet Take 1 tablet by mouth every 12 (twelve) hours for 7 days. 01/25/22 02/01/22 Yes Bero, Elmer Sow, MD  naproxen (NAPROSYN) 500 MG tablet Take 1 tablet (500 mg total) by mouth 2 (two) times daily. 01/25/22  Yes Sabas Sous, MD  oxyCODONE (ROXICODONE) 5 MG immediate release tablet Take 1 tablet (5 mg total) by mouth every 4 (four) hours as needed for severe pain. 01/25/22  Yes Sabas Sous, MD  ciprofloxacin (CIPRO) 500 MG tablet Take 1 tablet (500 mg total) by mouth every 12 (twelve) hours. 12/09/21   Achille Rich, PA-C  escitalopram (LEXAPRO) 10 MG tablet Take 1 tablet (10 mg total) by mouth daily. 12/12/21   Donita Brooks, MD  promethazine (PHENERGAN) 25 MG tablet Take 1 tablet (25 mg total) by mouth every 6 (six) hours as  needed for nausea or vomiting. 06/16/18 12/07/19  Burgess Amor, PA-C                                                                                                                                    Past Surgical History Past Surgical History:  Procedure Laterality Date   CESAREAN SECTION     CESAREAN SECTION     CHOLECYSTECTOMY     MOUTH SURGERY     TUBAL LIGATION     WISDOM TOOTH EXTRACTION     Family History Family History  Problem Relation Age of Onset   Diabetes Mother    Hypertension Mother    Hypertension Father     Social History Social History   Tobacco Use   Smoking status: Former    Packs/day: 0.04    Years: 1.50    Total pack years: 0.06    Types: Cigarettes  Quit date: 11/21/2010    Years since quitting: 11.1   Smokeless tobacco: Never  Vaping Use   Vaping Use: Never used  Substance Use Topics   Alcohol use: No    Alcohol/week: 0.0 standard drinks of alcohol   Drug use: No   Allergies Coconut flavor and Pineapple  Review of Systems Review of Systems  Gastrointestinal:  Positive for abdominal pain and nausea.    Physical Exam Vital Signs  I have reviewed the triage vital signs BP 123/78   Pulse 60   Temp 98.2 F (36.8 C) (Oral)   Resp 17   Ht 5\' 7"  (1.702 m)   Wt 112.4 kg   LMP 12/31/2021   SpO2 96%   BMI 38.81 kg/m   Physical Exam Vitals and nursing note reviewed.  Constitutional:      General: She is not in acute distress.    Appearance: She is well-developed.  HENT:     Head: Normocephalic and atraumatic.  Eyes:     Conjunctiva/sclera: Conjunctivae normal.  Cardiovascular:     Rate and Rhythm: Normal rate and regular rhythm.     Heart sounds: No murmur heard. Pulmonary:     Effort: Pulmonary effort is normal. No respiratory distress.     Breath sounds: Normal breath sounds.  Abdominal:     Palpations: Abdomen is soft.     Tenderness: There is abdominal tenderness in the left lower quadrant.  Musculoskeletal:         General: No swelling.     Cervical back: Neck supple.  Skin:    General: Skin is warm and dry.     Capillary Refill: Capillary refill takes less than 2 seconds.  Neurological:     Mental Status: She is alert.  Psychiatric:        Mood and Affect: Mood normal.     ED Results and Treatments Labs (all labs ordered are listed, but only abnormal results are displayed) Labs Reviewed  COMPREHENSIVE METABOLIC PANEL - Abnormal; Notable for the following components:      Result Value   Potassium 3.3 (*)    Glucose, Bld 103 (*)    Calcium 8.7 (*)    Anion gap 4 (*)    All other components within normal limits  CBC - Abnormal; Notable for the following components:   Hemoglobin 11.5 (*)    HCT 35.4 (*)    MCV 78.3 (*)    MCH 25.4 (*)    All other components within normal limits  URINALYSIS, ROUTINE W REFLEX MICROSCOPIC - Abnormal; Notable for the following components:   APPearance HAZY (*)    All other components within normal limits  LIPASE, BLOOD  POC URINE PREG, ED                                                                                                                          Radiology CT ABDOMEN PELVIS W CONTRAST  Result Date: 01/25/2022 CLINICAL DATA:  Left lower quadrant pain. History of diverticulitis. Left-sided pain. EXAM: CT ABDOMEN AND PELVIS WITH CONTRAST TECHNIQUE: Multidetector CT imaging of the abdomen and pelvis was performed using the standard protocol following bolus administration of intravenous contrast. RADIATION DOSE REDUCTION: This exam was performed according to the departmental dose-optimization program which includes automated exposure control, adjustment of the mA and/or kV according to patient size and/or use of iterative reconstruction technique. CONTRAST:  OMNIPAQUE IOHEXOL 300 MG/ML  SOLN COMPARISON:  CT 12/09/2021 FINDINGS: Lower chest: No acute airspace disease or pleural effusion. Hepatobiliary: No focal hepatic abnormality. Clips in the  gallbladder fossa postcholecystectomy. No biliary dilatation. Pancreas: No ductal dilatation or inflammation. Spleen: Normal in size. Calcified granuloma. Stable subcapsular subcentimeter hypodensity, typically benign. Small splenule centrally. Adrenals/Urinary Tract: Normal adrenal glands. No hydronephrosis, perinephric edema, or focal renal abnormality. Partially distended urinary bladder. Stomach/Bowel: Inflamed diverticulum at the splenic flexure consistent with diverticulitis, series 2, image 29. No perforation or abscess. Multiple additional noninflamed colonic diverticula. Complete resolution of the inflammation involving the sigmoid colon from prior CT. Normal appendix. Normal small bowel. Unremarkable appearance of the stomach. Vascular/Lymphatic: Normal caliber abdominal aorta. Patent portal and splenic veins. No abdominopelvic adenopathy. Reproductive: Uterus and bilateral adnexa are unremarkable. Other: No free air, free fluid, or intra-abdominal fluid collection. Tiny fat containing umbilical hernia. Musculoskeletal: Chronic bilateral L5 pars interarticularis defects without listhesis. Sclerosis about the iliac aspect of the sacroiliac joints, right greater than left. No acute osseous findings. IMPRESSION: 1. Acute uncomplicated diverticulitis at the splenic flexure of the colon. No perforation or abscess. 2. Chronic bilateral L5 pars interarticularis defects without listhesis. Electronically Signed   By: Narda Rutherford M.D.   On: 01/25/2022 00:36    Pertinent labs & imaging results that were available during my care of the patient were reviewed by me and considered in my medical decision making (see MDM for details).  Medications Ordered in ED Medications  iohexol (OMNIPAQUE) 300 MG/ML solution 100 mL (100 mLs Intravenous Contrast Given 01/25/22 0010)  lactated ringers bolus 1,000 mL (0 mLs Intravenous Stopped 01/25/22 0002)  morphine (PF) 4 MG/ML injection 4 mg (4 mg Intravenous Given 01/24/22  2210)  ondansetron (ZOFRAN) injection 4 mg (4 mg Intravenous Given 01/24/22 2210)  morphine (PF) 4 MG/ML injection 4 mg (4 mg Intravenous Given 01/25/22 0115)  amoxicillin-clavulanate (AUGMENTIN) 875-125 MG per tablet 1 tablet (1 tablet Oral Given 01/25/22 0111)                                                                                                                                     Procedures Procedures  (including critical care time)  Medical Decision Making / ED Course   This patient presents to the ED for concern of abdominal pain, nausea, this involves an extensive number of treatment options, and is a complaint that carries with it a high risk of complications and morbidity.  The differential diagnosis  includes diverticulitis, constipation, intestinal gas, hollow viscus perforation, intra-abdominal abscess  MDM: Patient seen emergency room for evaluation of abdominal pain and nausea.  Physical exam with left lower quadrant tenderness to palpation.  Laboratory evaluation with mild hypokalemia, hemoglobin 11.5 with an MCV of 78.3 but is otherwise unremarkable.  At time of signout, patient pending CT abdomen pelvis.  Anticipate diagnosis of diverticulitis and discharged home with antibiotics but please see provider signout for continuation of work-up.   Additional history obtained:  -External records from outside source obtained and reviewed including: Chart review including previous notes, labs, imaging, consultation notes   Lab Tests: -I ordered, reviewed, and interpreted labs.   The pertinent results include:   Labs Reviewed  COMPREHENSIVE METABOLIC PANEL - Abnormal; Notable for the following components:      Result Value   Potassium 3.3 (*)    Glucose, Bld 103 (*)    Calcium 8.7 (*)    Anion gap 4 (*)    All other components within normal limits  CBC - Abnormal; Notable for the following components:   Hemoglobin 11.5 (*)    HCT 35.4 (*)    MCV 78.3 (*)    MCH 25.4  (*)    All other components within normal limits  URINALYSIS, ROUTINE W REFLEX MICROSCOPIC - Abnormal; Notable for the following components:   APPearance HAZY (*)    All other components within normal limits  LIPASE, BLOOD  POC URINE PREG, ED        Imaging Studies ordered: I ordered imaging studies including CTAP but results are pending   Medicines ordered and prescription drug management: Meds ordered this encounter  Medications   iohexol (OMNIPAQUE) 300 MG/ML solution 100 mL   lactated ringers bolus 1,000 mL   morphine (PF) 4 MG/ML injection 4 mg   ondansetron (ZOFRAN) injection 4 mg   morphine (PF) 4 MG/ML injection 4 mg   amoxicillin-clavulanate (AUGMENTIN) 875-125 MG per tablet 1 tablet   amoxicillin-clavulanate (AUGMENTIN) 875-125 MG tablet    Sig: Take 1 tablet by mouth every 12 (twelve) hours for 7 days.    Dispense:  14 tablet    Refill:  0   naproxen (NAPROSYN) 500 MG tablet    Sig: Take 1 tablet (500 mg total) by mouth 2 (two) times daily.    Dispense:  30 tablet    Refill:  0   oxyCODONE (ROXICODONE) 5 MG immediate release tablet    Sig: Take 1 tablet (5 mg total) by mouth every 4 (four) hours as needed for severe pain.    Dispense:  8 tablet    Refill:  0    -I have reviewed the patients home medicines and have made adjustments as needed  Critical interventions none   Cardiac Monitoring: The patient was maintained on a cardiac monitor.  I personally viewed and interpreted the cardiac monitored which showed an underlying rhythm of: NSR  Social Determinants of Health:  Factors impacting patients care include: none   Reevaluation: After the interventions noted above, I reevaluated the patient and found that they have :improved  Co morbidities that complicate the patient evaluation  Past Medical History:  Diagnosis Date   Carpal tunnel syndrome    Diverticulosis    Migraine    Obesity       Dispostion: I considered admission for this  patient, and disposition pending results of CT imaging.  Please see provider signout for continuation of work-up.     Final Clinical Impression(s) /  ED Diagnoses Final diagnoses:  Diverticulitis     @PCDICTATION @    , MD 01/25/22 [phone]

## 2022-01-25 NOTE — Discharge Instructions (Signed)
You were evaluated in the Emergency Department and after careful evaluation, we did not find any emergent condition requiring admission or further testing in the hospital.  Your exam/testing today was overall reassuring.  Symptoms seem to be due to diverticulitis.  Take the Augmentin antibiotic as directed.  Use the Naprosyn twice daily for pain.  Can use the oxycodone for more significant pain.  Please return to the Emergency Department if you experience any worsening of your condition.  Thank you for allowing [place] to be a part of your care.

## 2022-01-25 NOTE — ED Notes (Signed)
Patient transported to CT 

## 2022-01-29 ENCOUNTER — Other Ambulatory Visit: Payer: 59

## 2022-01-29 ENCOUNTER — Ambulatory Visit (INDEPENDENT_AMBULATORY_CARE_PROVIDER_SITE_OTHER): Payer: 59

## 2022-01-29 ENCOUNTER — Ambulatory Visit
Admission: EM | Admit: 2022-01-29 | Discharge: 2022-01-29 | Disposition: A | Discharge status: Home / Self Care | Payer: 59 | Attending: Urgent Care | Admitting: Urgent Care

## 2022-01-29 DIAGNOSIS — M79671 Pain in right foot: Secondary | ICD-10-CM | POA: Diagnosis not present

## 2022-01-29 DIAGNOSIS — S96911A Strain of unspecified muscle and tendon at ankle and foot level, right foot, initial encounter: Secondary | ICD-10-CM

## 2022-01-29 DIAGNOSIS — K5732 Diverticulitis of large intestine without perforation or abscess without bleeding: Secondary | ICD-10-CM

## 2022-01-29 NOTE — ED Provider Notes (Signed)
[facility]   MRN: [id number] DOB: Apr 08, 1986  Subjective:   Stacy [name] is a 36 y.o. female presenting for suffering a right foot injury today.  Patient missed a step off of a ladder and landed very awkwardly on her right foot.  Has since had severe 9 out of 10 constant pain over the top and bottom of her foot.  Feels like there is swelling as well.  She is currently taking Augmentin for diverticulitis and is using naproxen and oxycodone for pain control.  No current facility-administered medications for this encounter.  Current Outpatient Medications:    amoxicillin-clavulanate (AUGMENTIN) 875-125 MG tablet, Take 1 tablet by mouth every 12 (twelve) hours for 7 days., Disp: 14 tablet, Rfl: 0   ciprofloxacin (CIPRO) 500 MG tablet, Take 1 tablet (500 mg total) by mouth every 12 (twelve) hours., Disp: 14 tablet, Rfl: 0   escitalopram (LEXAPRO) 10 MG tablet, Take 1 tablet (10 mg total) by mouth daily., Disp: 30 tablet, Rfl: 5   naproxen (NAPROSYN) 500 MG tablet, Take 1 tablet (500 mg total) by mouth 2 (two) times daily., Disp: 30 tablet, Rfl: 0   oxyCODONE (ROXICODONE) 5 MG immediate release tablet, Take 1 tablet (5 mg total) by mouth every 4 (four) hours as needed for severe pain., Disp: 8 tablet, Rfl: 0   Allergies  Allergen Reactions   Coconut Flavor Other (See Comments)    Mouth/throat lesions    Pineapple Other (See Comments)    Mouth/throat leasions    Past Medical History:  Diagnosis Date   Carpal tunnel syndrome    Diverticulosis    Migraine    Obesity      Past Surgical History:  Procedure Laterality Date   CESAREAN SECTION     CESAREAN SECTION     CHOLECYSTECTOMY     MOUTH SURGERY     TUBAL LIGATION     WISDOM TOOTH EXTRACTION      Family History  Problem Relation Age of Onset   Diabetes Mother    Hypertension Mother    Hypertension Father     Social History   Tobacco Use   Smoking status: Former    Packs/day: 0.04    Years: 1.50     Total pack years: 0.06    Types: Cigarettes    Quit date: 11/21/2010    Years since quitting: 11.1   Smokeless tobacco: Never  Vaping Use   Vaping Use: Never used  Substance Use Topics   Alcohol use: No    Alcohol/week: 0.0 standard drinks of alcohol   Drug use: No    ROS   Objective:   Vitals: BP (!) 145/83 (BP Location: Right Arm)   Pulse 70   Temp 98 F (36.7 C) (Oral)   Resp 16   LMP 01/29/2022 (Exact Date)   SpO2 99%   Physical Exam Constitutional:      General: She is not in acute distress.    Appearance: Normal appearance. She is well-developed. She is not ill-appearing, toxic-appearing or diaphoretic.  HENT:     Head: Normocephalic and atraumatic.     Nose: Nose normal.     Mouth/Throat:     Mouth: Mucous membranes are moist.  Eyes:     General: No scleral icterus.       Right eye: No discharge.        Left eye: No discharge.     Extraocular Movements: Extraocular movements intact.  Cardiovascular:     Rate and Rhythm:  Normal rate.  Pulmonary:     Effort: Pulmonary effort is normal.  Musculoskeletal:       Feet:  Skin:    General: Skin is warm and dry.  Neurological:     General: No focal deficit present.     Mental Status: She is alert and oriented to person, place, and time.  Psychiatric:        Mood and Affect: Mood normal.        Behavior: Behavior normal.     DG Foot Complete Right  Result Date: 01/29/2022 CLINICAL DATA:  right foot pain. Pt fell off a ladder today and rolled her foot, pt states painis greatest at 3rd-4th metatarsal. EXAM: RIGHT FOOT COMPLETE - 3+ VIEW COMPARISON:  X-ray right foot 02/23/2017 FINDINGS: There is no evidence of fracture or dislocation. There is no evidence of arthropathy or other focal bone abnormality. Soft tissues are unremarkable. IMPRESSION: No acute displaced fracture or dislocation. Electronically Signed   By: Tish Frederickson M.D.   On: 01/29/2022 18:43     Assessment and Plan :   PDMP not reviewed this  encounter.  1. Right foot strain, initial encounter   2. Right foot pain   3. Diverticulitis of colon    X-rays negative.  Offered a postop shoe.  Recommended using the pain medication she is already prescribed.  Advise general RICE method as well. Counseled patient on potential for adverse effects with medications prescribed/recommended today, ER and return-to-clinic precautions discussed, patient verbalized understanding.    Wallis Bamberg, PA-C 01/29/22 1900

## 2022-01-29 NOTE — ED Triage Notes (Signed)
Pt states she stepped wrong off a ladder and injured her right foot.  States the pain is mostly to the top of the foot.

## 2022-02-06 ENCOUNTER — Ambulatory Visit: Payer: 59 | Admitting: Family Medicine

## 2022-02-27 ENCOUNTER — Other Ambulatory Visit: Payer: Self-pay

## 2022-02-27 ENCOUNTER — Emergency Department (HOSPITAL_COMMUNITY)
Admission: EM | Admit: 2022-02-27 | Discharge: 2022-02-27 | Disposition: A | Discharge status: Home / Self Care | Payer: 59 | Attending: Emergency Medicine | Admitting: Emergency Medicine

## 2022-02-27 ENCOUNTER — Encounter (HOSPITAL_COMMUNITY): Payer: Self-pay

## 2022-02-27 ENCOUNTER — Emergency Department (HOSPITAL_COMMUNITY): Payer: 59

## 2022-02-27 DIAGNOSIS — R112 Nausea with vomiting, unspecified: Secondary | ICD-10-CM | POA: Diagnosis not present

## 2022-02-27 DIAGNOSIS — R1012 Left upper quadrant pain: Secondary | ICD-10-CM | POA: Diagnosis present

## 2022-02-27 LAB — CBC WITH DIFFERENTIAL/PLATELET
Abs Immature Granulocytes: 0.03 10*3/uL (ref 0.00–0.07)
Basophils Absolute: 0 10*3/uL (ref 0.0–0.1)
Basophils Relative: 0 %
Eosinophils Absolute: 0.2 10*3/uL (ref 0.0–0.5)
Eosinophils Relative: 3 %
HCT: 34 % — ABNORMAL LOW (ref 36.0–46.0)
Hemoglobin: 11 g/dL — ABNORMAL LOW (ref 12.0–15.0)
Immature Granulocytes: 0 %
Lymphocytes Relative: 32 %
Lymphs Abs: 2.4 10*3/uL (ref 0.7–4.0)
MCH: 25.2 pg — ABNORMAL LOW (ref 26.0–34.0)
MCHC: 32.4 g/dL (ref 30.0–36.0)
MCV: 77.8 fL — ABNORMAL LOW (ref 80.0–100.0)
Monocytes Absolute: 0.4 10*3/uL (ref 0.1–1.0)
Monocytes Relative: 6 %
Neutro Abs: 4.3 10*3/uL (ref 1.7–7.7)
Neutrophils Relative %: 59 %
Platelets: 245 10*3/uL (ref 150–400)
RBC: 4.37 MIL/uL (ref 3.87–5.11)
RDW: 13.8 % (ref 11.5–15.5)
WBC: 7.5 10*3/uL (ref 4.0–10.5)
nRBC: 0 % (ref 0.0–0.2)

## 2022-02-27 LAB — LACTIC ACID, PLASMA: Lactic Acid, Venous: 0.6 mmol/L (ref 0.5–1.9)

## 2022-02-27 LAB — COMPREHENSIVE METABOLIC PANEL
ALT: 18 U/L (ref 0–44)
AST: 17 U/L (ref 15–41)
Albumin: 3.3 g/dL — ABNORMAL LOW (ref 3.5–5.0)
Alkaline Phosphatase: 42 U/L (ref 38–126)
Anion gap: 5 (ref 5–15)
BUN: 10 mg/dL (ref 6–20)
CO2: 24 mmol/L (ref 22–32)
Calcium: 8.1 mg/dL — ABNORMAL LOW (ref 8.9–10.3)
Chloride: 110 mmol/L (ref 98–111)
Creatinine, Ser: 0.57 mg/dL (ref 0.44–1.00)
GFR, Estimated: >60 mL/min (ref >60)
Glucose, Bld: 100 mg/dL — ABNORMAL HIGH (ref 70–99)
Potassium: 3.5 mmol/L (ref 3.5–5.1)
Sodium: 139 mmol/L (ref 135–145)
Total Bilirubin: 1.1 mg/dL (ref 0.3–1.2)
Total Protein: 6.2 g/dL — ABNORMAL LOW (ref 6.5–8.1)

## 2022-02-27 LAB — URINALYSIS, ROUTINE W REFLEX MICROSCOPIC
Bilirubin Urine: NEGATIVE
Glucose, UA: NEGATIVE mg/dL
Ketones, ur: NEGATIVE mg/dL
Leukocytes,Ua: NEGATIVE
Nitrite: NEGATIVE
Protein, ur: NEGATIVE mg/dL
RBC / HPF: >50 RBC/hpf — ABNORMAL HIGH (ref 0–5)
Specific Gravity, Urine: 1.02 (ref 1.005–1.030)
pH: 5 (ref 5.0–8.0)

## 2022-02-27 LAB — POC URINE PREG, ED: Preg Test, Ur: NEGATIVE

## 2022-02-27 LAB — LIPASE, BLOOD: Lipase: 23 U/L (ref 11–51)

## 2022-02-27 MED ORDER — ONDANSETRON HCL 4 MG/2ML IJ SOLN
4.0000 mg | Freq: Once | INTRAMUSCULAR | Status: AC
Start: 2022-02-27 — End: 2022-02-27
  Administered 2022-02-27: 4 mg via INTRAVENOUS
  Filled 2022-02-27: qty 2

## 2022-02-27 MED ORDER — IOHEXOL 300 MG/ML  SOLN
100.0000 mL | Freq: Once | INTRAMUSCULAR | Status: AC | PRN
  Administered 2022-02-27: 100 mL via INTRAVENOUS

## 2022-02-27 MED ORDER — LACTATED RINGERS IV BOLUS
1000.0000 mL | Freq: Once | INTRAVENOUS | Status: AC
  Administered 2022-02-27: 1000 mL via INTRAVENOUS

## 2022-02-27 MED ORDER — ACETAMINOPHEN 500 MG PO TABS
1000.0000 mg | ORAL_TABLET | Freq: Once | ORAL | Status: AC
  Administered 2022-02-27: 1000 mg via ORAL
  Filled 2022-02-27: qty 2

## 2022-02-27 MED ORDER — HYDROMORPHONE HCL 1 MG/ML IJ SOLN
1.0000 mg | Freq: Once | INTRAMUSCULAR | Status: AC
  Administered 2022-02-27: 1 mg via INTRAVENOUS
  Filled 2022-02-27: qty 1

## 2022-02-27 NOTE — Discharge Instructions (Addendum)
Thank you for coming to the [facility] Emergency Department. You were evaluated for abdominal pain and had a negative CT scan that demonstrated only "tiny renal calculi" or tiny kidney stones that do not require treatment at this time. You had no evidence of diverticulitis or complications thereof. While CT is very sensitive for detecting diverticulitis, it is possible that you are experiencing early-stage diverticulitis. Please rest your bowels for a few days. Please follow up with your primary care doctor within 1 week and have him refer you to gastroenterology.  I have messaged him as well.   Do not hesitate to return to the ED or call 911 if you experience:  -Worsening abdominal pain -Inability to eat or drink -Bloody vomit or stool -Dark tarry stool -Fevers/chills -Anything else that concerns you

## 2022-02-27 NOTE — ED Triage Notes (Signed)
Patient reports left flank pain with vomiting x2 hours. Describes pain as sharp/stabbing. Kidney stone in past.

## 2022-02-27 NOTE — ED Provider Notes (Signed)
[facility] EMERGENCY DEPARTMENT Provider Note   CSN: [id number] Arrival date & time: 02/27/22  9563     History  Chief Complaint  Patient presents with   Flank Pain    Stacy [name] is a 36 y.o. female.  Patient presents to the emergency department for evaluation of left upper abdominal pain with nausea and vomiting.  Patient reports that the pain started around 2 hours ago.  It is sharp and stabbing in nature.       Home Medications Prior to Admission medications   Medication Sig Start Date End Date Taking? Authorizing Provider  ciprofloxacin (CIPRO) 500 MG tablet Take 1 tablet (500 mg total) by mouth every 12 (twelve) hours. 12/09/21   Achille Rich, PA-C  escitalopram (LEXAPRO) 10 MG tablet Take 1 tablet (10 mg total) by mouth daily. 12/12/21   Donita Brooks, MD  naproxen (NAPROSYN) 500 MG tablet Take 1 tablet (500 mg total) by mouth 2 (two) times daily. 01/25/22   Sabas Sous, MD  oxyCODONE (ROXICODONE) 5 MG immediate release tablet Take 1 tablet (5 mg total) by mouth every 4 (four) hours as needed for severe pain. 01/25/22   Sabas Sous, MD  promethazine (PHENERGAN) 25 MG tablet Take 1 tablet (25 mg total) by mouth every 6 (six) hours as needed for nausea or vomiting. 06/16/18 12/07/19  Burgess Amor, PA-C      Allergies    Coconut flavor and Pineapple    Review of Systems   Review of Systems  Physical Exam Updated Vital Signs BP (!) 123/57   Pulse (!) 56   Temp 97.7 F (36.5 C) (Oral)   Resp 15   Ht 5\' 7"  (1.702 m)   Wt 108.9 kg   LMP 01/29/2022 (Exact Date)   SpO2 99%   BMI 37.59 kg/m  Physical Exam Vitals and nursing note reviewed.  Constitutional:      General: She is not in acute distress.    Appearance: She is well-developed.  HENT:     Head: Normocephalic and atraumatic.     Mouth/Throat:     Mouth: Mucous membranes are moist.  Eyes:     General: Vision grossly intact. Gaze aligned appropriately.     Extraocular Movements: Extraocular  movements intact.     Conjunctiva/sclera: Conjunctivae normal.  Cardiovascular:     Rate and Rhythm: Normal rate and regular rhythm.     Pulses: Normal pulses.     Heart sounds: Normal heart sounds, S1 normal and S2 normal. No murmur heard.    No friction rub. No gallop.  Pulmonary:     Effort: Pulmonary effort is normal. No respiratory distress.     Breath sounds: Normal breath sounds.  Abdominal:     General: Bowel sounds are normal.     Palpations: Abdomen is soft.     Tenderness: There is abdominal tenderness in the left upper quadrant. There is no guarding or rebound.     Hernia: No hernia is present.  Musculoskeletal:        General: No swelling.     Cervical back: Full passive range of motion without pain, normal range of motion and neck supple. No spinous process tenderness or muscular tenderness. Normal range of motion.     Right lower leg: No edema.     Left lower leg: No edema.  Skin:    General: Skin is warm and dry.     Capillary Refill: Capillary refill takes less than 2 seconds.  Findings: No ecchymosis, erythema, rash or wound.  Neurological:     General: No focal deficit present.     Mental Status: She is alert and oriented to person, place, and time.     GCS: GCS eye subscore is 4. GCS verbal subscore is 5. GCS motor subscore is 6.     Cranial Nerves: Cranial nerves 2-12 are intact.     Sensory: Sensation is intact.     Motor: Motor function is intact.     Coordination: Coordination is intact.  Psychiatric:        Attention and Perception: Attention normal.        Mood and Affect: Mood normal.        Speech: Speech normal.        Behavior: Behavior normal.     ED Results / Procedures / Treatments   Labs (all labs ordered are listed, but only abnormal results are displayed) Labs Reviewed  URINALYSIS, ROUTINE W REFLEX MICROSCOPIC  CBC WITH DIFFERENTIAL/PLATELET  COMPREHENSIVE METABOLIC PANEL  LIPASE, BLOOD  LACTIC ACID, PLASMA  POC URINE PREG, ED     EKG None  Radiology No results found.  Procedures Procedures    Medications Ordered in ED Medications  lactated ringers bolus 1,000 mL (has no administration in time range)  HYDROmorphone (DILAUDID) injection 1 mg (has no administration in time range)  ondansetron (ZOFRAN) injection 4 mg (has no administration in time range)    ED Course/ Medical Decision Making/ A&P                           Medical Decision Making Amount and/or Complexity of Data Reviewed Labs: ordered.   Patient with left upper quadrant abdominal pain, nausea and vomiting.  Patient reports that she has a history of kidney stones and is concerned that it might be a kidney stone.  Reviewing her records, however, reveals that she was recently diagnosed with diverticulitis.  I reviewed those images.  She had diverticulitis at the splenic flexure which is the area she is hurting.  Additionally, there were no stones in her kidney at that time.  Work-up will be performed to evaluate for persistent, recurrence diverticulitis or complicated diverticulitis.  Lipase to rule out pancreatitis.  Considerations are gastritis, peptic ulcer disease, small bowel obstruction.  Patient given analgesia, IV fluids, labs are ordered.  Will undergo CT scan.  Will sign with oncoming ER physician to follow-up.        Final Clinical Impression(s) / ED Diagnoses Final diagnoses:  Abdominal pain, left upper quadrant    Rx / DC Orders ED Discharge Orders     None         Marcha Licklider, Canary Brim, MD 02/27/22 [phone]

## 2022-02-27 NOTE — ED Provider Notes (Signed)
Assumed care of patient from off-going team. For more details, please see note from same day.  In brief, this is a 36 y.o. female with carpal tunnel syndrome, HSV-2, obesity who p/w abdominal pain. Patient reports she has had 5 episodes of diverticulitis since May 2023. Recent h/o diverticulitis at splenic flexure managed medically. P/w 8/10 LUQ pain.  Plan/Dispo at time of sign-out & ED Course since sign-out: [ ]  labs, CT.  R/o abscess, microperf.   BP 125/68   Pulse (!) 55   Temp 97.7 F (36.5 C) (Oral)   Resp 18   Ht 5\' 7"  (1.702 m)   Wt 108.9 kg   LMP 01/29/2022 (Exact Date)   SpO2 96%   BMI 37.59 kg/m    ED Course:   Clinical Course as of 02/27/22 1208  Fri Feb 27, 2022  0845 Lactic Acid, Venous: 0.6 [HN]  0845 Hemoglobin(!): 11.0 [HN]  0845 WBC: 7.5 No leukocytosis [HN]  0846 Lipase: 23 wnl [HN]  0846 Comprehensive metabolic panel(!) Unremarkable in context of presentation [HN]  0942 Preg Test, Ur: Negative [HN]  0942 RBC / HPF(!): >50 Hematuria present with no UTI [HN]  1008 CT ABDOMEN PELVIS W CONTRAST 1. No acute finding. 2. Tiny bilateral renal calculi.  Mild colonic diverticulosis.   [HN]    Clinical Course User Index [HN] Sun, MD   Patient states that she is on the end of her menstrual cycle and therefore explains the blood in the urine.   Discussed with patient at length about her findings.  Patient states that she still is having abdominal pain but it is manageable.  Encourage patient to rest her bowels and drink plenty of fluids over the next couple of days and discussed with her that while the CT imaging is very sensitive for detecting diverticulitis, it is possible she is experiencing another episode of early diverticulitis.  I do not believe that in the absence of imaging findings and data supporting antibiotic treatment that an outpatient course of antibiotics is necessary for her at this time.  I messaged Dr. Mar 01, 2022 her primary care  physician to get her a follow-up appointment within 1 week and encourage patient to follow-up with gastroenterology by referral as well.  Patient states she will call her PCP nurse today.   Gave patient extensive discharge instructions and return precautions.  Patient reports understanding and all questions answered to patient satisfaction.  Dispo: DC ------------------------------- Loetta Rough, MD Emergency Medicine  This note was created using dictation software, which may contain spelling or grammatical errors.    Tanya Nones, MD 02/27/22 1213

## 2022-03-03 ENCOUNTER — Telehealth: Payer: Self-pay

## 2022-03-03 NOTE — Telephone Encounter (Signed)
Transition Care Management Follow-up Telephone Call Date of discharge and from where: 02/27/22, [facility] Dx: Lennox Laity Pain, [place]la Pence How have you been since you were released from the hospital? Feels ok now Any questions or concerns? No  Items Reviewed: Did the pt receive and understand the discharge instructions provided? Yes  Medications obtained and verified? No  Other? No  Any new allergies since your discharge? No  Dietary orders reviewed? Yes Do you have support at home? Yes   Home Care and Equipment/Supplies: Were home health services ordered? not applicable If so, what is the name of the agency?   Has the agency set up a time to come to the patient's home? N/a Were any new equipment or medical supplies ordered?  No What is the name of the medical supply agency? N/a Were you able to get the supplies/equipment? not applicable Do you have any questions related to the use of the equipment or supplies? No  Functional Questionnaire: (I = Independent and D = Dependent) ADLs: I  Bathing/Dressing- I  Meal Prep- I  Eating- I  Maintaining continence- I  Transferring/Ambulation- I  Managing Meds- I  Follow up appointments reviewed:  PCP Hospital f/u appt confirmed? Yes  Scheduled to see Monday, 03/30/22 @ 3pm. Specialist Hospital f/u appt confirmed? No  Scheduled to see n/a on n/a @ n/a. Are transportation arrangements needed? No  If their condition worsens, is the pt aware to call PCP or go to the Emergency Dept.? Yes Was the patient provided with contact information for the PCP's office or ED? Yes Was to pt encouraged to call back with questions or concerns? Yes

## 2022-03-30 ENCOUNTER — Inpatient Hospital Stay: Payer: 59 | Admitting: Family Medicine

## 2022-04-03 ENCOUNTER — Inpatient Hospital Stay: Payer: 59 | Admitting: Family Medicine

## 2022-04-11 ENCOUNTER — Ambulatory Visit
Admission: EM | Admit: 2022-04-11 | Discharge: 2022-04-11 | Disposition: A | Discharge status: Home / Self Care | Payer: 59 | Attending: Family Medicine | Admitting: Family Medicine

## 2022-04-11 ENCOUNTER — Other Ambulatory Visit: Payer: Self-pay

## 2022-04-11 ENCOUNTER — Encounter: Payer: Self-pay | Admitting: Emergency Medicine

## 2022-04-11 DIAGNOSIS — Z1152 Encounter for screening for COVID-19: Secondary | ICD-10-CM | POA: Diagnosis not present

## 2022-04-11 DIAGNOSIS — J069 Acute upper respiratory infection, unspecified: Secondary | ICD-10-CM

## 2022-04-11 LAB — RESP PANEL BY RT-PCR (FLU A&B, COVID) ARPGX2
Influenza A by PCR: NEGATIVE
Influenza B by PCR: NEGATIVE
SARS Coronavirus 2 by RT PCR: NEGATIVE

## 2022-04-11 MED ORDER — PROMETHAZINE-DM 6.25-15 MG/5ML PO SYRP
5.0000 mL | ORAL_SOLUTION | Freq: Four times a day (QID) | ORAL | 0 refills | Status: DC | PRN
Start: 2022-04-11 — End: 2022-05-15

## 2022-04-11 NOTE — ED Triage Notes (Signed)
Pt reports sore throat, chills, and cough x2 days. Pt reports cough is similar to when had pneumonia diagnosis and reports daughter also recently diagnosed with strep. Denies any known fever.

## 2022-04-15 NOTE — ED Provider Notes (Signed)
[facility]    CSN: [id number] Arrival date & time: 04/11/22  9628      History   Chief Complaint Chief Complaint  Patient presents with   Cough    HPI Stacy [name] is a 36 y.o. female.   Patient presenting today with 2-day history of sore throat, cough, chills, aches, fatigue.  Denies chest pain, shortness of breath, abdominal pain, nausea vomiting or diarrhea.  States she feels similar to when she had pneumonia previously but is also been exposed to strep as her daughter was recently diagnosed.  So far not trying anything over-the-counter for symptoms.  No known history of pertinent chronic medical problems.    Past Medical History:  Diagnosis Date   Carpal tunnel syndrome    Diverticulosis    Migraine    Obesity     Patient Active Problem List   Diagnosis Date Noted   HSV-2 (herpes simplex virus 2) infection 08/07/2015   Precordial pain 05/31/2013   Obesity    Carpal tunnel syndrome     Past Surgical History:  Procedure Laterality Date   CESAREAN SECTION     CESAREAN SECTION     CHOLECYSTECTOMY     MOUTH SURGERY     TUBAL LIGATION     WISDOM TOOTH EXTRACTION      OB History     Gravida  2   Para  2   Term  2   Preterm      AB      Living  2      SAB      IAB      Ectopic      Multiple      Live Births               Home Medications    Prior to Admission medications   Medication Sig Start Date End Date Taking? Authorizing Provider  promethazine-dextromethorphan (PROMETHAZINE-DM) 6.25-15 MG/5ML syrup Take 5 mLs by mouth 4 (four) times daily as needed. 04/11/22  Yes Particia Nearing, PA-C  ciprofloxacin (CIPRO) 500 MG tablet Take 1 tablet (500 mg total) by mouth every 12 (twelve) hours. Patient not taking: Reported on 02/27/2022 12/09/21   Achille Rich, PA-C  escitalopram (LEXAPRO) 10 MG tablet Take 1 tablet (10 mg total) by mouth daily. Patient not taking: Reported on 02/27/2022 12/12/21   Donita Brooks, MD   naproxen (NAPROSYN) 500 MG tablet Take 1 tablet (500 mg total) by mouth 2 (two) times daily. Patient not taking: Reported on 02/27/2022 01/25/22   Sabas Sous, MD  oxyCODONE (ROXICODONE) 5 MG immediate release tablet Take 1 tablet (5 mg total) by mouth every 4 (four) hours as needed for severe pain. Patient not taking: Reported on 02/27/2022 01/25/22   Sabas Sous, MD  promethazine (PHENERGAN) 25 MG tablet Take 1 tablet (25 mg total) by mouth every 6 (six) hours as needed for nausea or vomiting. 06/16/18 12/07/19  Burgess Amor, PA-C    Family History Family History  Problem Relation Age of Onset   Diabetes Mother    Hypertension Mother    Hypertension Father     Social History Social History   Tobacco Use   Smoking status: Former    Packs/day: 0.04    Years: 1.50    Total pack years: 0.06    Types: Cigarettes    Quit date: 11/21/2010    Years since quitting: 11.4   Smokeless tobacco: Never  Vaping Use  Vaping Use: Never used  Substance Use Topics   Alcohol use: No    Alcohol/week: 0.0 standard drinks of alcohol   Drug use: No     Allergies   Coconut flavor and Pineapple   Review of Systems Review of Systems Per HPI  Physical Exam Triage Vital Signs ED Triage Vitals [04/11/22 0958]  Enc Vitals Group     BP 123/86     Pulse Rate 78     Resp 20     Temp 98.1 F (36.7 C)     Temp Source Oral     SpO2 98 %     Weight      Height      Head Circumference      Peak Flow      Pain Score 7     Pain Loc      Pain Edu?      Excl. in [place]?    No data found.  Updated Vital Signs BP 123/86 (BP Location: Right Arm)   Pulse 78   Temp 98.1 F (36.7 C) (Oral)   Resp 20   LMP 03/25/2022 (Approximate)   SpO2 98%   Visual Acuity Right Eye Distance:   Left Eye Distance:   Bilateral Distance:    Right Eye Near:   Left Eye Near:    Bilateral Near:     Physical Exam Vitals and nursing note reviewed.  Constitutional:      Appearance: Normal appearance. She is  not ill-appearing.  HENT:     Head: Atraumatic.     Right Ear: Tympanic membrane and external ear normal.     Left Ear: Tympanic membrane and external ear normal.     Nose: Rhinorrhea present.     Mouth/Throat:     Mouth: Mucous membranes are moist.     Pharynx: Posterior oropharyngeal erythema present.  Eyes:     Extraocular Movements: Extraocular movements intact.     Conjunctiva/sclera: Conjunctivae normal.  Cardiovascular:     Rate and Rhythm: Normal rate and regular rhythm.     Heart sounds: Normal heart sounds.  Pulmonary:     Effort: Pulmonary effort is normal.     Breath sounds: Normal breath sounds. No wheezing or rales.  Musculoskeletal:        General: Normal range of motion.     Cervical back: Normal range of motion and neck supple.  Lymphadenopathy:     Cervical: No cervical adenopathy.  Skin:    General: Skin is warm and dry.  Neurological:     Mental Status: She is alert and oriented to person, place, and time.  Psychiatric:        Mood and Affect: Mood normal.        Thought Content: Thought content normal.        Judgment: Judgment normal.    UC Treatments / Results  Labs (all labs ordered are listed, but only abnormal results are displayed) Labs Reviewed  RESP PANEL BY RT-PCR (FLU A&B, COVID) ARPGX2    EKG   Radiology No results found.  Procedures Procedures (including critical care time)  Medications Ordered in UC Medications - No data to display  Initial Impression / Assessment and Plan / UC Course  I have reviewed the triage vital signs and the nursing notes.  Pertinent labs & imaging results that were available during my care of the patient were reviewed by me and considered in my medical decision making (see chart for details).  Vitals and exam reassuring and suspicious for viral upper respiratory infection.  Respiratory panel pending, treat with Phenergan DM, supportive over-the-counter medications and home care.  Return for  worsening symptoms.  Final Clinical Impressions(s) / UC Diagnoses   Final diagnoses:  Viral URI with cough   Discharge Instructions   None    ED Prescriptions     Medication Sig Dispense Auth. Provider   promethazine-dextromethorphan (PROMETHAZINE-DM) 6.25-15 MG/5ML syrup Take 5 mLs by mouth 4 (four) times daily as needed. 100 mL Particia Nearing, [place]      PDMP not reviewed this encounter.   Roosvelt Maser [place], [place] 04/15/22 [phone]

## 2022-05-15 ENCOUNTER — Ambulatory Visit
Admission: EM | Admit: 2022-05-15 | Discharge: 2022-05-15 | Disposition: A | Discharge status: Home / Self Care | Payer: 59 | Attending: Nurse Practitioner | Admitting: Nurse Practitioner

## 2022-05-15 ENCOUNTER — Encounter: Payer: Self-pay | Admitting: Emergency Medicine

## 2022-05-15 ENCOUNTER — Other Ambulatory Visit: Payer: Self-pay

## 2022-05-15 DIAGNOSIS — B9689 Other specified bacterial agents as the cause of diseases classified elsewhere: Secondary | ICD-10-CM

## 2022-05-15 DIAGNOSIS — J019 Acute sinusitis, unspecified: Secondary | ICD-10-CM | POA: Diagnosis not present

## 2022-05-15 MED ORDER — FLUCONAZOLE 150 MG PO TABS: 150.0000 mg | ORAL_TABLET | Freq: Once | ORAL | 0 refills | Status: AC

## 2022-05-15 MED ORDER — AMOXICILLIN-POT CLAVULANATE 875-125 MG PO TABS: 1.0000 | ORAL_TABLET | Freq: Two times a day (BID) | ORAL | 0 refills | Status: AC

## 2022-05-15 NOTE — ED Triage Notes (Signed)
Pt reports cough, nasal congestion, runny nose, sore throat x2 weeks. Pt reports intermittent fevers. Has tried otc medication and nasal flushes with no change in symptoms. Pt reports exposure to rsv.

## 2022-05-15 NOTE — Discharge Instructions (Signed)
You have a bacterial sinus infection.  Please take the Augmentin (antibiotic) twice daily for 7 days to treat this.  Continue tylenol/ibuprofen and Mucinex 600 mg twice daily.  Make sure you are drinking plenty of water to help loosen the congestion.   If you develop vaginal itching/discharge AFTER you have taken the entire course of antibiotic, go ahead and take the yeast infection medicine (Diflucan).

## 2022-05-15 NOTE — ED Provider Notes (Signed)
[facility]    CSN: [id number] Arrival date & time: 05/15/22  7829      History   Chief Complaint Chief Complaint  Patient presents with   Cough    HPI Stacy [name] is a 36 y.o. female.   Patient presents for 2 weeks of fevers, congested cough, chest pain after coughing, nasal congestion, runny nose, postnasal drainage, sore throat, sinus pressure in her "whole face", headache, decreased appetite because her taste is off, fatigue, and lightheadedness with position changes.  She denies shortness of breath, chest tightness or chest congestion, tooth pain, ear pain or pressure, abdominal pain, nausea/vomiting, diarrhea, and new rash.  Reports her 3 of her children were sick last week with RSV and she had similar symptoms, however she has not fully gotten better.  She has been taking Mucinex and Delsym along with Tylenol/ibuprofen as needed without much relief.  Patient denies antibiotic use in the past 90 days.  Reports she is prone to yeast infections after antibiotic therapy.    Past Medical History:  Diagnosis Date   Carpal tunnel syndrome    Diverticulosis    Migraine    Obesity     Patient Active Problem List   Diagnosis Date Noted   HSV-2 (herpes simplex virus 2) infection 08/07/2015   Precordial pain 05/31/2013   Obesity    Carpal tunnel syndrome     Past Surgical History:  Procedure Laterality Date   CESAREAN SECTION     CESAREAN SECTION     CHOLECYSTECTOMY     MOUTH SURGERY     TUBAL LIGATION     WISDOM TOOTH EXTRACTION      OB History     Gravida  2   Para  2   Term  2   Preterm      AB      Living  2      SAB      IAB      Ectopic      Multiple      Live Births               Home Medications    Prior to Admission medications   Medication Sig Start Date End Date Taking? Authorizing Provider  amoxicillin-clavulanate (AUGMENTIN) 875-125 MG tablet Take 1 tablet by mouth 2 (two) times daily for 7 days. 05/15/22  05/22/22 Yes Valentino Nose, NP  fluconazole (DIFLUCAN) 150 MG tablet Take 1 tablet (150 mg total) by mouth once for 1 dose. 05/15/22 05/15/22 Yes Valentino Nose, NP  promethazine (PHENERGAN) 25 MG tablet Take 1 tablet (25 mg total) by mouth every 6 (six) hours as needed for nausea or vomiting. 06/16/18 12/07/19  Burgess Amor, PA-C    Family History Family History  Problem Relation Age of Onset   Diabetes Mother    Hypertension Mother    Hypertension Father     Social History Social History   Tobacco Use   Smoking status: Former    Packs/day: 0.04    Years: 1.50    Total pack years: 0.06    Types: Cigarettes    Quit date: 11/21/2010    Years since quitting: 11.4   Smokeless tobacco: Never  Vaping Use   Vaping Use: Never used  Substance Use Topics   Alcohol use: No    Alcohol/week: 0.0 standard drinks of alcohol   Drug use: No     Allergies   Coconut flavor and Pineapple   Review of  Systems Review of Systems Per HPI  Physical Exam Triage Vital Signs ED Triage Vitals  Enc Vitals Group     BP 05/15/22 1125 122/76     Pulse Rate 05/15/22 1125 70     Resp 05/15/22 1125 20     Temp 05/15/22 1125 98.5 F (36.9 C)     Temp Source 05/15/22 1125 Oral     SpO2 05/15/22 1125 94 %     Weight --      Height --      Head Circumference --      Peak Flow --      Pain Score 05/15/22 1126 4     Pain Loc --      Pain Edu? --      Excl. in [place]? --    No data found.  Updated Vital Signs BP 122/76 (BP Location: Right Arm)   Pulse 70   Temp 98.5 F (36.9 C) (Oral)   Resp 20   LMP 05/14/2022 (Approximate)   SpO2 94%   Visual Acuity Right Eye Distance:   Left Eye Distance:   Bilateral Distance:    Right Eye Near:   Left Eye Near:    Bilateral Near:     Physical Exam Vitals and nursing note reviewed.  Constitutional:      General: She is not in acute distress.    Appearance: Normal appearance. She is ill-appearing. She is not toxic-appearing.  HENT:      Head: Normocephalic and atraumatic.     Right Ear: Ear canal and external ear normal. A middle ear effusion is present.     Left Ear: Ear canal and external ear normal. A middle ear effusion is present.     Nose: Congestion present. No rhinorrhea.     Right Sinus: Maxillary sinus tenderness and frontal sinus tenderness present.     Left Sinus: Maxillary sinus tenderness and frontal sinus tenderness present.     Mouth/Throat:     Mouth: Mucous membranes are moist.     Pharynx: Oropharynx is clear. No oropharyngeal exudate or posterior oropharyngeal erythema.  Eyes:     General: No scleral icterus.    Extraocular Movements: Extraocular movements intact.  Cardiovascular:     Rate and Rhythm: Normal rate and regular rhythm.  Pulmonary:     Effort: Pulmonary effort is normal. No respiratory distress.     Breath sounds: Normal breath sounds. No wheezing, rhonchi or rales.  Abdominal:     General: Abdomen is flat. Bowel sounds are normal. There is no distension.     Palpations: Abdomen is soft.     Tenderness: There is no abdominal tenderness. There is no guarding.  Musculoskeletal:     Cervical back: Normal range of motion and neck supple.  Lymphadenopathy:     Cervical: No cervical adenopathy.  Skin:    General: Skin is warm and dry.     Capillary Refill: Capillary refill takes less than 2 seconds.     Coloration: Skin is not jaundiced or pale.     Findings: No erythema or rash.  Neurological:     Mental Status: She is alert and oriented to person, place, and time.  Psychiatric:        Behavior: Behavior is cooperative.      UC Treatments / Results  Labs (all labs ordered are listed, but only abnormal results are displayed) Labs Reviewed - No data to display  EKG   Radiology No results found.  Procedures Procedures (  including critical care time)  Medications Ordered in UC Medications - No data to display  Initial Impression / Assessment and Plan / UC Course  I have  reviewed the triage vital signs and the nursing notes.  Pertinent labs & imaging results that were available during my care of the patient were reviewed by me and considered in my medical decision making (see chart for details).  Patient is well-appearing, normotensive, afebrile, not tachycardic, not tachypneic, oxygenating well on room air.    Acute bacterial sinusitis Treat with Augmentin twice daily for 7 days Supportive care discussed including continued Mucinex, oral fluids, nasal rinses Prescription given for Diflucan if yeast infection symptoms develop ER and return precautions discussed  The patient was given the opportunity to ask questions.  All questions answered to their satisfaction.  The patient is in agreement to this plan.    Final Clinical Impressions(s) / UC Diagnoses   Final diagnoses:  Acute bacterial sinusitis     Discharge Instructions      You have a bacterial sinus infection.  Please take the Augmentin (antibiotic) twice daily for 7 days to treat this.  Continue tylenol/ibuprofen and Mucinex 600 mg twice daily.  Make sure you are drinking plenty of water to help loosen the congestion.   If you develop vaginal itching/discharge AFTER you have taken the entire course of antibiotic, go ahead and take the yeast infection medicine (Diflucan).     ED Prescriptions     Medication Sig Dispense Auth. Provider   amoxicillin-clavulanate (AUGMENTIN) 875-125 MG tablet Take 1 tablet by mouth 2 (two) times daily for 7 days. 14 tablet Cathlean Marseilles A, NP   fluconazole (DIFLUCAN) 150 MG tablet Take 1 tablet (150 mg total) by mouth once for 1 dose. 1 tablet Valentino Nose, NP      PDMP not reviewed this encounter.   Valentino Nose, NP 05/15/22 1145

## 2022-06-04 ENCOUNTER — Ambulatory Visit (INDEPENDENT_AMBULATORY_CARE_PROVIDER_SITE_OTHER): Payer: 59 | Admitting: Family Medicine

## 2022-06-04 ENCOUNTER — Encounter: Payer: Self-pay | Admitting: Family Medicine

## 2022-06-04 VITALS — BP 126/82 | HR 67 | Ht 67.0 in | Wt 246.0 lb

## 2022-06-04 DIAGNOSIS — G5603 Carpal tunnel syndrome, bilateral upper limbs: Secondary | ICD-10-CM

## 2022-06-04 NOTE — Progress Notes (Signed)
Subjective:    Patient ID: Stacy [name], female    DOB: [birth year], 36 y.o.   MRN: [id number]  Hand Pain   Patient has a history of carpal tunnel syndrome.  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  She states that her hands go numb.  The numbness involves all the fingers of her hands except for her pinky finger.  She also reports that her hands fall asleep at night when she tries to sleep.  She wakes up and they are aching and burning and throbbing.  She reports intense nervelike pain radiating into both thumbs and both index fingers.  She has tried the cock-up wrist splints with no success she tried oral NSAIDs with no success.  She is here today requesting a referral to an orthopedist for surgical correction Past Medical History:  Diagnosis Date   Carpal tunnel syndrome    Diverticulosis    Migraine    Obesity    Past Surgical History:  Procedure Laterality Date   CESAREAN SECTION     CESAREAN SECTION     CHOLECYSTECTOMY     MOUTH SURGERY     TUBAL LIGATION     WISDOM TOOTH EXTRACTION     Current Outpatient Medications on File Prior to Visit  Medication Sig Dispense Refill   [DISCONTINUED] promethazine (PHENERGAN) 25 MG tablet Take 1 tablet (25 mg total) by mouth every 6 (six) hours as needed for nausea or vomiting. 30 tablet 0   No current facility-administered medications on file prior to visit.   Allergies  Allergen Reactions   Coconut Flavor Other (See Comments)    Mouth/throat lesions    Pineapple Other (See Comments)    Mouth/throat leasions   Social History   Socioeconomic History   Marital status: Single    Spouse name: Not on file   Number of children: 2   Years of education: 10th    Highest education level: Not on file  Occupational History   Occupation: Crew member at [employer]  Tobacco Use   Smoking status: Former    Packs/day: 0.04    Years: 1.50    Total pack years: 0.06    Types: Cigarettes    Quit date:  11/21/2010    Years since quitting: 11.5   Smokeless tobacco: Never  Vaping Use   Vaping Use: Never used  Substance and Sexual Activity   Alcohol use: No    Alcohol/week: 0.0 standard drinks of alcohol   Drug use: No   Sexual activity: Not Currently    Birth control/protection: Surgical  Other Topics Concern   Not on file  Social History Narrative   Not on file   Social Determinants of Health   Financial Resource Strain: Not on file  Food Insecurity: Not on file  Transportation Needs: Not on file  Physical Activity: Not on file  Stress: Not on file  Social Connections: Not on file  Intimate Partner Violence: Not on file     Review of Systems  All other systems reviewed and are negative.      Objective:   Physical Exam Constitutional:      Appearance: Normal appearance. She is obese. She is not ill-appearing, toxic-appearing or diaphoretic.  Cardiovascular:     Rate and Rhythm: Normal rate and regular rhythm.     Pulses: Normal pulses.     Heart sounds: Normal heart sounds.  Pulmonary:     Effort: Pulmonary effort is  normal.     Breath sounds: Normal breath sounds.  Musculoskeletal:     Right hand: Normal. No swelling, deformity or bony tenderness. Normal strength. Normal sensation.     Left hand: Normal. No swelling, deformity or bony tenderness. Normal strength. Normal sensation.  Neurological:     Mental Status: She is alert.     Patient has no visible deformity in the hand or the wrist.  She does have a positive Tinel's sign bilaterally.  Phalen sign is also positive.      Assessment & Plan:  Carpal tunnel syndrome, bilateral - Plan: Ambulatory referral to Hand Surgery Patient has tried and failed conservative therapy including cock-up wrist splints as well as oral NSAIDs.  We discussed a cortisone injection into the carpal tunnel for symptom relief but the patient would like to go ahead and see a hand specialist to talk about definitive correction.  She  states that her mother had to have surgery on both wrist and that she had tried the cortisone shots and had no relief.  Therefore she would like to talk to a hand specialist

## 2022-06-17 ENCOUNTER — Ambulatory Visit
Admission: EM | Admit: 2022-06-17 | Discharge: 2022-06-17 | Disposition: A | Discharge status: Home / Self Care | Payer: 59

## 2022-06-17 DIAGNOSIS — R6889 Other general symptoms and signs: Secondary | ICD-10-CM

## 2022-06-17 DIAGNOSIS — Z20828 Contact with and (suspected) exposure to other viral communicable diseases: Secondary | ICD-10-CM

## 2022-06-17 DIAGNOSIS — J069 Acute upper respiratory infection, unspecified: Secondary | ICD-10-CM

## 2022-06-17 MED ORDER — PROMETHAZINE-DM 6.25-15 MG/5ML PO SYRP: 5.0000 mL | ORAL_SOLUTION | Freq: Four times a day (QID) | ORAL | 0 refills | Status: DC | PRN

## 2022-06-17 MED ORDER — FLUTICASONE PROPIONATE 50 MCG/ACT NA SUSP: 2.0000 | Freq: Every day | NASAL | 0 refills | Status: DC

## 2022-06-17 NOTE — Discharge Instructions (Addendum)
Take medication as prescribed. Increase fluids and allow for plenty of rest. May take over-the-counter Tylenol as needed for pain, fever, general discomfort. Recommend using a humidifier at nighttime and sleeping elevated on pillows while cough symptoms persist. Warm salt water gargles 3-4 times daily while throat pain persist. If your symptoms suddenly worsen, such as worsening shortness of breath, difficulty breathing, or become unable to speak in a complete sentence, please go to the emergency department for further evaluation. As discussed, a viral infection can last anywhere from 10 to 14 days, please follow-up with your primary care physician if symptoms suddenly worsen during that time, or extend beyond that timeframe. Follow-up as needed. 

## 2022-06-17 NOTE — ED Triage Notes (Signed)
Pt reports cough and nasal congestion x 1 week; fever 103.0 F; x 5 days;shortness of breath and chest congestion x 2 days. Mucinex, Motrin and Tylenol  give some relief.  Rep0orst her family has Flu.

## 2022-06-17 NOTE — ED Provider Notes (Signed)
[facility]    CSN: [id number] Arrival date & time: 06/17/22  0950      History   Chief Complaint Chief Complaint  Patient presents with   Cough   Shortness of Breath         HPI Stacy [name] is a 36 y.o. female.   The history is provided by the patient.   Patient presents for complaints of flulike symptoms that been present over the past several days.  Patient complains of cough, nasal congestion, fever, chest congestion, and shortness of breath.  Patient denies headache, wheezing, ear pain, wheezing, abdominal pain, nausea, vomiting, or diarrhea.  Patient has been taking Mucinex, Motrin, and Tylenol for her symptoms.  Reports that her entire family has been diagnosed with the flu.  Past Medical History:  Diagnosis Date   Carpal tunnel syndrome    Diverticulosis    Migraine    Obesity     Patient Active Problem List   Diagnosis Date Noted   HSV-2 (herpes simplex virus 2) infection 08/07/2015   Precordial pain 05/31/2013   Obesity    Carpal tunnel syndrome     Past Surgical History:  Procedure Laterality Date   CESAREAN SECTION     CESAREAN SECTION     CHOLECYSTECTOMY     MOUTH SURGERY     TUBAL LIGATION     WISDOM TOOTH EXTRACTION      OB History     Gravida  2   Para  2   Term  2   Preterm      AB      Living  2      SAB      IAB      Ectopic      Multiple      Live Births               Home Medications    Prior to Admission medications   Medication Sig Start Date End Date Taking? Authorizing Provider  acetaminophen (TYLENOL) 500 MG tablet Take 500 mg by mouth every 6 (six) hours as needed.   Yes [provider]  fluticasone (FLONASE) 50 MCG/ACT nasal spray Place 2 sprays into both nostrils daily. 06/17/22  Yes Diamante Rubin-Warren, Sadie Haber, NP  guaiFENesin (MUCINEX) 600 MG 12 hr tablet Take by mouth 2 (two) times daily.   Yes [provider]  ibuprofen (ADVIL) 200 MG tablet Take 200 mg by mouth  every 6 (six) hours as needed.   Yes [provider]  promethazine-dextromethorphan (PROMETHAZINE-DM) 6.25-15 MG/5ML syrup Take 5 mLs by mouth 4 (four) times daily as needed for cough. 06/17/22  Yes Shanica Castellanos-Warren, Sadie Haber, NP  promethazine (PHENERGAN) 25 MG tablet Take 1 tablet (25 mg total) by mouth every 6 (six) hours as needed for nausea or vomiting. 06/16/18 12/07/19  Burgess Amor, PA-C    Family History Family History  Problem Relation Age of Onset   Diabetes Mother    Hypertension Mother    Hypertension Father     Social History Social History   Tobacco Use   Smoking status: Former    Packs/day: 0.04    Years: 1.50    Total pack years: 0.06    Types: Cigarettes    Quit date: 11/21/2010    Years since quitting: 11.5   Smokeless tobacco: Never  Vaping Use   Vaping Use: Never used  Substance Use Topics   Alcohol use: No    Alcohol/week: 0.0 standard drinks of  alcohol   Drug use: No     Allergies   Coconut flavor and Pineapple   Review of Systems Review of Systems Per HPI  Physical Exam Triage Vital Signs ED Triage Vitals  Enc Vitals Group     BP 06/17/22 1046 (!) 122/91     Pulse Rate 06/17/22 1046 71     Resp 06/17/22 1046 18     Temp 06/17/22 1046 98.2 F (36.8 C)     Temp Source 06/17/22 1046 Oral     SpO2 06/17/22 1046 97 %     Weight --      Height --      Head Circumference --      Peak Flow --      Pain Score 06/17/22 1049 0     Pain Loc --      Pain Edu? --      Excl. in GC? --    No data found.  Updated Vital Signs BP (!) 122/91 (BP Location: Right Arm)   Pulse 71   Temp 98.2 F (36.8 C) (Oral)   Resp 18   LMP  (Within Days)   SpO2 97%   Visual Acuity Right Eye Distance:   Left Eye Distance:   Bilateral Distance:    Right Eye Near:   Left Eye Near:    Bilateral Near:     Physical Exam Vitals and nursing note reviewed.  Constitutional:      General: She is not in acute distress.    Appearance: She is  well-developed.  HENT:     Head: Normocephalic.     Right Ear: Tympanic membrane, ear canal and external ear normal.     Left Ear: Tympanic membrane, ear canal and external ear normal.     Nose: Congestion and rhinorrhea present.     Mouth/Throat:     Mouth: Mucous membranes are moist.     Comments: Cobblestoning present to oropharynx Eyes:     Extraocular Movements: Extraocular movements intact.     Pupils: Pupils are equal, round, and reactive to light.  Cardiovascular:     Rate and Rhythm: Normal rate and regular rhythm.     Pulses: Normal pulses.     Heart sounds: Normal heart sounds.  Pulmonary:     Effort: Pulmonary effort is normal.     Breath sounds: Normal breath sounds. No decreased breath sounds, wheezing, rhonchi or rales.  Abdominal:     General: Bowel sounds are normal.     Palpations: Abdomen is soft.     Tenderness: There is no guarding.  Musculoskeletal:     Cervical back: Normal range of motion.  Lymphadenopathy:     Cervical: No cervical adenopathy.  Skin:    General: Skin is warm and dry.  Neurological:     General: No focal deficit present.     Mental Status: She is alert and oriented to person, place, and time.  Psychiatric:        Mood and Affect: Mood normal.        Behavior: Behavior normal.        Thought Content: Thought content normal.      UC Treatments / Results  Labs (all labs ordered are listed, but only abnormal results are displayed) Labs Reviewed - No data to display  EKG   Radiology No results found.  Procedures Procedures (including critical care time)  Medications Ordered in UC Medications - No data to display  Initial Impression / Assessment and  Plan / UC Course  I have reviewed the triage vital signs and the nursing notes.  Pertinent labs & imaging results that were available during my care of the patient were reviewed by me and considered in my medical decision making (see chart for details).  The patient is  well-appearing, she is in no acute distress, vital signs are stable.  The patient is well-appearing, she is in no acute distress, vital signs are stable.  Given her recent exposure to influenza, symptoms are consistent.  She is out of the window to receive Tamiflu.  Discussing with patient and advised that symptomatic treatment will be provided today.  For her cough, Promethazine DM was provided, and for her nasal congestion, fluticasone 50 mcg nasal spray was prescribed.  Patient was advised to continue use of Tylenol or ibuprofen as needed for pain, fever, or general discomfort.  Discussed viral etiology with the patient and when follow-up may be necessary.  Patient verbalizes understanding.  All questions were answered.  Patient is stable for discharge.  Work note was provided. Final Clinical Impressions(s) / UC Diagnoses   Final diagnoses:  Flu-like symptoms  Viral upper respiratory tract infection with cough  Exposure to influenza     Discharge Instructions      Take medication as prescribed. Increase fluids and allow for plenty of rest. May take over-the-counter Tylenol as needed for pain, fever, general discomfort. Recommend using a humidifier at nighttime and sleeping elevated on pillows while cough symptoms persist. Warm salt water gargles 3-4 times daily while throat pain persist. If your symptoms suddenly worsen, such as worsening shortness of breath, difficulty breathing, or become unable to speak in a complete sentence, please go to the emergency department for further evaluation. As discussed, a viral infection can last anywhere from 10 to 14 days, please follow-up with your primary care physician if symptoms suddenly worsen during that time, or extend beyond that timeframe. Follow-up as needed.     ED Prescriptions     Medication Sig Dispense Auth. Provider   promethazine-dextromethorphan (PROMETHAZINE-DM) 6.25-15 MG/5ML syrup Take 5 mLs by mouth 4 (four) times daily as  needed for cough. 118 mL Jarrah Babich-Warren, Sadie Haber, NP   fluticasone (FLONASE) 50 MCG/ACT nasal spray Place 2 sprays into both nostrils daily. 16 g Shandelle Borrelli-Warren, Sadie Haber, NP      PDMP not reviewed this encounter.   Abran Cantor, NP 06/17/22 1126

## 2022-06-23 DIAGNOSIS — G5603 Carpal tunnel syndrome, bilateral upper limbs: Secondary | ICD-10-CM | POA: Insufficient documentation

## 2022-07-17 DIAGNOSIS — G8918 Other acute postprocedural pain: Secondary | ICD-10-CM | POA: Insufficient documentation

## 2022-09-15 DIAGNOSIS — M79642 Pain in left hand: Secondary | ICD-10-CM | POA: Insufficient documentation

## 2022-12-27 ENCOUNTER — Other Ambulatory Visit: Payer: Self-pay

## 2022-12-27 ENCOUNTER — Encounter (HOSPITAL_COMMUNITY): Payer: Self-pay

## 2022-12-27 ENCOUNTER — Emergency Department (HOSPITAL_COMMUNITY): Payer: 59

## 2022-12-27 ENCOUNTER — Emergency Department (HOSPITAL_COMMUNITY)
Admission: EM | Admit: 2022-12-27 | Discharge: 2022-12-27 | Disposition: A | Discharge status: Home / Self Care | Payer: 59 | Attending: Emergency Medicine | Admitting: Emergency Medicine

## 2022-12-27 DIAGNOSIS — E876 Hypokalemia: Secondary | ICD-10-CM | POA: Diagnosis not present

## 2022-12-27 DIAGNOSIS — R0789 Other chest pain: Secondary | ICD-10-CM | POA: Diagnosis not present

## 2022-12-27 LAB — CBC WITH DIFFERENTIAL/PLATELET
Abs Immature Granulocytes: 0.03 10*3/uL (ref 0.00–0.07)
Basophils Absolute: 0 10*3/uL (ref 0.0–0.1)
Basophils Relative: 0 %
Eosinophils Absolute: 0.1 10*3/uL (ref 0.0–0.5)
Eosinophils Relative: 2 %
HCT: 38.2 % (ref 36.0–46.0)
Hemoglobin: 12.7 g/dL (ref 12.0–15.0)
Immature Granulocytes: 0 %
Lymphocytes Relative: 30 %
Lymphs Abs: 2.5 10*3/uL (ref 0.7–4.0)
MCH: 25.3 pg — ABNORMAL LOW (ref 26.0–34.0)
MCHC: 33.2 g/dL (ref 30.0–36.0)
MCV: 76.1 fL — ABNORMAL LOW (ref 80.0–100.0)
Monocytes Absolute: 0.3 10*3/uL (ref 0.1–1.0)
Monocytes Relative: 4 %
Neutro Abs: 5.4 10*3/uL (ref 1.7–7.7)
Neutrophils Relative %: 64 %
Platelets: 308 10*3/uL (ref 150–400)
RBC: 5.02 MIL/uL (ref 3.87–5.11)
RDW: 13.9 % (ref 11.5–15.5)
WBC: 8.5 10*3/uL (ref 4.0–10.5)
nRBC: 0 % (ref 0.0–0.2)

## 2022-12-27 LAB — BASIC METABOLIC PANEL
Anion gap: 9 (ref 5–15)
BUN: 10 mg/dL (ref 6–20)
CO2: 21 mmol/L — ABNORMAL LOW (ref 22–32)
Calcium: 8.5 mg/dL — ABNORMAL LOW (ref 8.9–10.3)
Chloride: 107 mmol/L (ref 98–111)
Creatinine, Ser: 0.68 mg/dL (ref 0.44–1.00)
GFR, Estimated: >60 mL/min (ref >60)
Glucose, Bld: 133 mg/dL — ABNORMAL HIGH (ref 70–99)
Potassium: 3 mmol/L — ABNORMAL LOW (ref 3.5–5.1)
Sodium: 137 mmol/L (ref 135–145)

## 2022-12-27 LAB — PREGNANCY, URINE: Preg Test, Ur: NEGATIVE

## 2022-12-27 LAB — D-DIMER, QUANTITATIVE: D-Dimer, Quant: 0.31 ug/mL-FEU (ref 0.00–0.50)

## 2022-12-27 LAB — TROPONIN I (HIGH SENSITIVITY): Troponin I (High Sensitivity): 3 ng/L (ref <18)

## 2022-12-27 MED ORDER — LIDOCAINE 5 % EX PTCH
1.0000 | MEDICATED_PATCH | CUTANEOUS | Status: DC
  Administered 2022-12-27: 1 via TRANSDERMAL
  Filled 2022-12-27: qty 1

## 2022-12-27 MED ORDER — NAPROXEN 500 MG PO TABS: 500.0000 mg | ORAL_TABLET | Freq: Two times a day (BID) | ORAL | 0 refills | Status: DC

## 2022-12-27 MED ORDER — MORPHINE SULFATE (PF) 4 MG/ML IV SOLN
4.0000 mg | Freq: Once | INTRAVENOUS | Status: AC
  Administered 2022-12-27: 4 mg via INTRAVENOUS
  Filled 2022-12-27: qty 1

## 2022-12-27 MED ORDER — BUPIVACAINE-EPINEPHRINE (PF) 0.5% -1:200000 IJ SOLN: 1.8000 mL | Freq: Once | INTRAMUSCULAR | Status: DC

## 2022-12-27 MED ORDER — ONDANSETRON HCL 4 MG/2ML IJ SOLN
4.0000 mg | Freq: Once | INTRAMUSCULAR | Status: AC
  Administered 2022-12-27: 4 mg via INTRAVENOUS
  Filled 2022-12-27: qty 2

## 2022-12-27 MED ORDER — KETOROLAC TROMETHAMINE 15 MG/ML IJ SOLN
15.0000 mg | Freq: Once | INTRAMUSCULAR | Status: AC
  Administered 2022-12-27: 15 mg via INTRAVENOUS
  Filled 2022-12-27: qty 1

## 2022-12-27 MED ORDER — POTASSIUM CHLORIDE CRYS ER 20 MEQ PO TBCR
40.0000 meq | EXTENDED_RELEASE_TABLET | Freq: Once | ORAL | Status: AC
  Administered 2022-12-27: 40 meq via ORAL
  Filled 2022-12-27: qty 2

## 2022-12-27 NOTE — Discharge Instructions (Addendum)
Seen today for chest pain, your EKG looked reassuring, your potassium was a little low so we replaced this but this found no signs of heart attack, pneumonia, blood clots or other emergent cause of your chest pain.  Your pain is much worse when I pressed on her chest it may be due to inflammation of your chest wall so we are to treat with some anti-inflammatories and lidocaine patches.  Due to your family history, along with your chest pain I have placed a cardiology referral so he can follow-up with them as well.  Come back to the ER for any new or symptoms

## 2022-12-27 NOTE — ED Provider Notes (Signed)
[place] EMERGENCY DEPARTMENT AT [facility] Provider Note   CSN: [id number] Arrival date & time: 12/27/22  1941     History  Chief Complaint  Patient presents with   Chest Pain    Stacy [name] is a 37 y.o. female.  Presents the ER today complaining of left-sided chest pain that started yesterday and was intermittent because been more constant since this morning and getting worse in intensity, hurts when she takes a deep breath or lies down.  She also feels short of breath.  She denies cough or URI symptoms, no fevers or chills.  No nausea or vomiting.  Pain radiates to the left shoulder and under her left breast.  She denies any numbness or tingling.  Denies any trauma, she is never had these in the past.  She does not smoke, no history of hypertension, diabetes or heart disease, she reports family history of heart disease.  States her mother had heart attack before the age of 68 and she has a cousin who had heart attack at age 95.  Patient has no lower extremity swelling, no history of VTE, she is not on any birth control or other hormones.   Chest Pain      Home Medications Prior to Admission medications   Medication Sig Start Date End Date Taking? Authorizing Provider  acetaminophen (TYLENOL) 500 MG tablet Take 500 mg by mouth every 6 (six) hours as needed.   Yes [provider]  naproxen (NAPROSYN) 500 MG tablet Take 1 tablet (500 mg total) by mouth 2 (two) times daily. 12/27/22  Yes Sabrie Moritz A, PA-C  fluticasone (FLONASE) 50 MCG/ACT nasal spray Place 2 sprays into both nostrils daily. 06/17/22   Leath-Warren, Sadie Haber, NP  guaiFENesin (MUCINEX) 600 MG 12 hr tablet Take by mouth 2 (two) times daily.    [provider]  ibuprofen (ADVIL) 200 MG tablet Take 200 mg by mouth every 6 (six) hours as needed.    [provider]  promethazine-dextromethorphan (PROMETHAZINE-DM) 6.25-15 MG/5ML syrup Take 5 mLs by mouth 4 (four) times daily as  needed for cough. 06/17/22   Leath-Warren, Sadie Haber, NP  promethazine (PHENERGAN) 25 MG tablet Take 1 tablet (25 mg total) by mouth every 6 (six) hours as needed for nausea or vomiting. 06/16/18 12/07/19  Burgess Amor, PA-C      Allergies    Coconut flavor and Pineapple    Review of Systems   Review of Systems  Cardiovascular:  Positive for chest pain.    Physical Exam Updated Vital Signs BP (!) 141/74 (BP Location: Left Arm)   Pulse (!) 58   Temp 98 F (36.7 C) (Oral)   Resp (!) 32   Ht 5\' 7"  (1.702 m)   Wt 111.1 kg   LMP 12/27/2022 (Exact Date)   SpO2 98%   BMI 38.37 kg/m  Physical Exam Vitals and nursing note reviewed.  Constitutional:      General: She is not in acute distress.    Appearance: She is well-developed.  HENT:     Head: Normocephalic and atraumatic.  Eyes:     Conjunctiva/sclera: Conjunctivae normal.  Cardiovascular:     Rate and Rhythm: Normal rate and regular rhythm.     Heart sounds: No murmur heard. Pulmonary:     Effort: Pulmonary effort is normal. No respiratory distress.     Breath sounds: Normal breath sounds.  Chest:     Chest wall: Tenderness present. No crepitus or edema.  Abdominal:     Palpations: Abdomen is soft.     Tenderness: There is no abdominal tenderness.  Musculoskeletal:        General: No swelling.     Cervical back: Neck supple.  Skin:    General: Skin is warm and dry.     Capillary Refill: Capillary refill takes less than 2 seconds.  Neurological:     General: No focal deficit present.     Mental Status: She is alert.  Psychiatric:        Mood and Affect: Mood normal.     ED Results / Procedures / Treatments   Labs (all labs ordered are listed, but only abnormal results are displayed) Labs Reviewed  BASIC METABOLIC PANEL - Abnormal; Notable for the following components:      Result Value   Potassium 3.0 (*)    CO2 21 (*)    Glucose, Bld 133 (*)    Calcium 8.5 (*)    All other components within normal limits   CBC WITH DIFFERENTIAL/PLATELET - Abnormal; Notable for the following components:   MCV 76.1 (*)    MCH 25.3 (*)    All other components within normal limits  PREGNANCY, URINE  D-DIMER, QUANTITATIVE  TROPONIN I (HIGH SENSITIVITY)    EKG EKG Interpretation Date/Time:  Sunday December 27 2022 19:52:01 EDT Ventricular Rate:  61 PR Interval:  165 QRS Duration:  97 QT Interval:  404 QTC Calculation: 407 R Axis:   32  Text Interpretation: Sinus rhythm Low voltage, precordial leads Confirmed by Eber Hong ([id number]) on 12/27/2022 8:20:43 PM  Radiology DG Chest 2 View  Result Date: 12/27/2022 CLINICAL DATA:  Chest pain and shortness of breath. EXAM: CHEST - 2 VIEW COMPARISON:  07/17/2020 FINDINGS: The cardiomediastinal contours are normal. The lungs are clear. Pulmonary vasculature is normal. No consolidation, pleural effusion, or pneumothorax. No acute osseous abnormalities are seen. IMPRESSION: Negative radiographs of the chest. Electronically Signed   By: Narda Rutherford M.D.   On: 12/27/2022 20:17    Procedures Procedures    Medications Ordered in ED Medications  lidocaine (LIDODERM) 5 % 1 patch (1 patch Transdermal Patch Applied 12/27/22 2152)  morphine (PF) 4 MG/ML injection 4 mg (4 mg Intravenous Given 12/27/22 2103)  ondansetron (ZOFRAN) injection 4 mg (4 mg Intravenous Given 12/27/22 2103)  potassium chloride SA (KLOR-CON M) CR tablet 40 mEq (40 mEq Oral Given 12/27/22 2144)  ketorolac (TORADOL) 15 MG/ML injection 15 mg (15 mg Intravenous Given 12/27/22 2153)    ED Course/ Medical Decision Making/ A&P             HEART Score: 1                Medical Decision Making The patient presented today for chest pain that had started today and became worse this morning. EKG showed sinus rhythm, Chest Xray was independently reviewed by me and shows no pulmonary edema or infiltrate, no cardiomegaly.  I agree with radiology interpretation.   They were given morphine and Toradol and lidocaine  patch for their symptoms, and on repeat evaluation their symptoms are improved  I considered a broad differential including but not limited to ACS, PE, Dissection, pneumothorax, costochondritis, pneumonia, GERD, pericarditis, and pericardial effusion.  PE is considered low risk and ruled out by D-dimer  I feel disssection is very unlikely  EKG are not consisted with pericarditis  Their heart score is 2. Troponins show troponin is 3, patient has been having pain all  day no need for second troponin, CBC is reassuring, BMP shows hypokalemia otherwise reassuring  Patient's pain is reproducible on exam with palpation, given this I discussed that it may be costochondritis with the patient we will treat her with anti-inflammatories.  She is nontoxic in appearance, was able to ambulate without any difficulty out of the ED, she was worried about her family history of heart disease so was given cardiology referral as well.  I did consider that given that her pain is worse with lying back and pleuritic it could be a mild pericarditis not causing any EKG changes, though this is less likely because the pain is reproducible, regardless she is being discharged on anti-inflammatories and given her for cardiology referral.  She is advised on follow-up and strict return precautions.    Amount and/or Complexity of Data Reviewed Labs: ordered.  Risk Prescription drug management.           Final Clinical Impression(s) / ED Diagnoses Final diagnoses:  Other chest pain  Hypokalemia    Rx / DC Orders ED Discharge Orders          Ordered    Ambulatory referral to Cardiology       Comments: If you have not heard from the Cardiology office within the next 72 hours please call [phone].   12/27/22 2158    naproxen (NAPROSYN) 500 MG tablet  2 times daily        12/27/22 2205              Josem Kaufmann 12/27/22 2223    Eber Hong, MD 12/29/22 [phone]

## 2022-12-27 NOTE — ED Triage Notes (Signed)
Pt arrived via POV c/o persistent left side CP  X 2 days. Pt endorses nausea w/o emesis and SOB.

## 2022-12-27 NOTE — ED Notes (Signed)
Introduced self to pt Pt stated that she has sharp achy pain with SOB and chills that started in the LEFT side of upper chest yesterday. Pt stated that she has a sharp pain in LEFT shoulder that started today.  Nausea started this morning. Decrease app. No vomiting or diarrhea.  Pt attached to full monitor 12 lead completed Lab completed.  Urine sample sent Call bell on bed

## 2022-12-28 ENCOUNTER — Telehealth: Payer: Self-pay

## 2022-12-28 NOTE — Transitions of Care (Post Inpatient/ED Visit) (Signed)
   12/28/2022  Name: Stacy [name] MRN: [id number] DOB: [birth year]  Today's TOC FU Call Status: Today's TOC FU Call Status:: Successful TOC FU Call Competed TOC FU Call Complete Date: 12/28/22  Transition Care Management Follow-up Telephone Call Date of Discharge: 12/27/22 [name] Type of Discharge: Emergency Department Reason for ED Visit: Other: (Other chest pain) How have you been since you were released from the hospital?: Better Any questions or concerns?: No  Items Reviewed: Did you receive and understand the discharge instructions provided?: Yes Medications obtained,verified, and reconciled?: Yes (Medications Reviewed) Any new allergies since your discharge?: No Dietary orders reviewed?: Yes Do you have support at home?: Yes  Medications Reviewed Today: Medications Reviewed Today     Reviewed by Stacy Nicely, LPN (Licensed Practical Nurse) on 12/28/22 at 1344  Med List Status: <None>   Medication Order Taking? Sig Documenting Provider Last Dose Status Informant  acetaminophen (TYLENOL) 500 MG tablet [id number] Yes Take 500 mg by mouth every 6 (six) hours as needed. [provider] Taking Active   fluticasone (FLONASE) 50 MCG/ACT nasal spray [id number] Yes Place 2 sprays into both nostrils daily. Leath-Warren, Stacy Haber, NP Taking Active   guaiFENesin (MUCINEX) 600 MG 12 hr tablet [id number] Yes Take by mouth 2 (two) times daily. [provider] Taking Active   ibuprofen (ADVIL) 200 MG tablet [id number] Yes Take 200 mg by mouth every 6 (six) hours as needed. [provider] Taking Active   naproxen (NAPROSYN) 500 MG tablet [id number] Yes Take 1 tablet (500 mg total) by mouth 2 (two) times daily. [place] A, [place] Taking Active     Discontinued 12/07/19 0040   promethazine-dextromethorphan (PROMETHAZINE-DM) 6.25-15 MG/5ML syrup [id number] Yes Take 5 mLs by mouth 4 (four) times daily as needed for cough. Leath-Warren,  Stacy Haber, NP Taking Active             Home Care and Equipment/Supplies: Were Home Health Services Ordered?: NA Any new equipment or medical supplies ordered?: NA  Functional Questionnaire: Do you need assistance with bathing/showering or dressing?: No Do you need assistance with meal preparation?: No Do you need assistance with eating?: No Do you have difficulty maintaining continence: No Do you need assistance with getting out of bed/getting out of a chair/moving?: No Do you have difficulty managing or taking your medications?: No  Follow up appointments reviewed: PCP Follow-up appointment confirmed?: Yes Date of PCP follow-up appointment?: 01/05/23 Follow-up Provider: Dr  [facility] Follow-up appointment confirmed?: No Reason Specialist Follow-Up Not Confirmed: Patient has Specialist Provider Number and will Call for Appointment Do you need transportation to your follow-up appointment?: No Do you understand care options if your condition(s) worsen?: Yes-patient verbalized understanding    SIGNATURE  Woodfin Ganja LPN [facility] Nurse Health Advisor Direct Dial [phone]

## 2023-01-05 ENCOUNTER — Encounter: Payer: Self-pay | Admitting: Family Medicine

## 2023-01-05 ENCOUNTER — Ambulatory Visit (INDEPENDENT_AMBULATORY_CARE_PROVIDER_SITE_OTHER): Payer: 59 | Admitting: Family Medicine

## 2023-01-05 VITALS — BP 140/78 | HR 62 | Temp 98.2°F | Ht 67.0 in | Wt 246.0 lb

## 2023-01-05 DIAGNOSIS — R739 Hyperglycemia, unspecified: Secondary | ICD-10-CM

## 2023-01-05 NOTE — Progress Notes (Signed)
Subjective:    Patient ID: Stacy [name], female    DOB: 28-Jan-1986, 37 y.o.   MRN: [id number]  HPI  Patient recently went to the emergency room for chest pain.  She [place] from the beach when she felt a sudden painful sensation to the left of her sternum that radiated into her left shoulder.  She was also feeling short of breath.  This prompted her to go to the emergency room after the pain persisted for couple days.  In the emergency room troponins were negative.  Chest x-ray was negative.  EKG was unremarkable.  Labs were significant only for an elevated blood sugar and low potassium.  Pain was reproducible with palpation of the chest wall and it was felt that the patient had chest wall pain possible costochondritis.  She is now pain-free.  She has been scheduled to meet with cardiology in September.  Of note she does have a very concerning family history.  She states that her mother had several heart attacks in her early 27s.  Other family members have had premature cardiovascular disease.  Therefore I do believe that she would benefit from further restratification. Past Medical History:  Diagnosis Date   Carpal tunnel syndrome    Diverticulosis    Migraine    Obesity    Past Surgical History:  Procedure Laterality Date   CESAREAN SECTION     CESAREAN SECTION     CHOLECYSTECTOMY     MOUTH SURGERY     TUBAL LIGATION     WISDOM TOOTH EXTRACTION     Current Outpatient Medications on File Prior to Visit  Medication Sig Dispense Refill   acetaminophen (TYLENOL) 500 MG tablet Take 500 mg by mouth every 6 (six) hours as needed.     fluticasone (FLONASE) 50 MCG/ACT nasal spray Place 2 sprays into both nostrils daily. (Patient not taking: Reported on 01/05/2023) 16 g 0   guaiFENesin (MUCINEX) 600 MG 12 hr tablet Take by mouth 2 (two) times daily. (Patient not taking: Reported on 01/05/2023)     ibuprofen (ADVIL) 200 MG tablet Take 200 mg by mouth every 6 (six) hours as needed.  (Patient not taking: Reported on 01/05/2023)     naproxen (NAPROSYN) 500 MG tablet Take 1 tablet (500 mg total) by mouth 2 (two) times daily. (Patient not taking: Reported on 01/05/2023) 10 tablet 0   promethazine-dextromethorphan (PROMETHAZINE-DM) 6.25-15 MG/5ML syrup Take 5 mLs by mouth 4 (four) times daily as needed for cough. (Patient not taking: Reported on 01/05/2023) 118 mL 0   [DISCONTINUED] promethazine (PHENERGAN) 25 MG tablet Take 1 tablet (25 mg total) by mouth every 6 (six) hours as needed for nausea or vomiting. 30 tablet 0   No current facility-administered medications on file prior to visit.   Allergies  Allergen Reactions   Coconut Flavor Other (See Comments)    Mouth/throat lesions    Pineapple Other (See Comments)    Mouth/throat leasions   Social History   Socioeconomic History   Marital status: Single    Spouse name: Not on file   Number of children: 2   Years of education: 10th    Highest education level: Not on file  Occupational History   Occupation: Crew member at [employer]  Tobacco Use   Smoking status: Former    Current packs/day: 0.00    Average packs/day: (0.1 ttl pk-yrs)    Types: Cigarettes    Start date: 05/22/2009    Quit date: 11/21/2010  Years since quitting: 12.1   Smokeless tobacco: Never  Vaping Use   Vaping status: Never Used  Substance and Sexual Activity   Alcohol use: No    Alcohol/week: 0.0 standard drinks of alcohol   Drug use: No   Sexual activity: Yes    Birth control/protection: Surgical  Other Topics Concern   Not on file  Social History Narrative   Not on file   Social Determinants of Health   Financial Resource Strain: Not on file  Food Insecurity: Not on file  Transportation Needs: Not on file  Physical Activity: Not on file  Stress: Not on file  Social Connections: Not on file  Intimate Partner Violence: Not on file     Review of Systems  All other systems reviewed and are negative.      Objective:    Physical Exam Constitutional:      Appearance: Normal appearance. She is obese. She is not ill-appearing, toxic-appearing or diaphoretic.  Cardiovascular:     Rate and Rhythm: Normal rate and regular rhythm.     Pulses: Normal pulses.     Heart sounds: Normal heart sounds.  Pulmonary:     Effort: Pulmonary effort is normal.     Breath sounds: Normal breath sounds.  Abdominal:     General: Bowel sounds are normal. There is no distension.     Palpations: Abdomen is soft. There is no mass.     Tenderness: There is no abdominal tenderness. There is no guarding or rebound.  Neurological:     Mental Status: She is alert.           Assessment & Plan:  Elevated blood sugar - Plan: BASIC METABOLIC PANEL WITH GFR, Hemoglobin A1c I believe dealing with chest wall pain or costochondritis.  The chest pain has resolved so no further treatment is necessary.  However given her extensive family history of premature cardiovascular disease, I do believe that she would benefit from seeing cardiology and perhaps getting CT of her coronary arteries to evaluate for any blockages.  Her blood sugar was elevated.  This was a random blood sugar.  I will repeat that today along with an A1c.  She also had random hypokalemia and I will repeat that today.  Patient already has a follow-up appointment with cardiology in September.  I believe that this is sufficient

## 2023-01-06 LAB — BASIC METABOLIC PANEL WITH GFR
BUN: 9 mg/dL (ref 7–25)
CO2: 29 mmol/L (ref 20–32)
Calcium: 9 mg/dL (ref 8.6–10.2)
Chloride: 105 mmol/L (ref 98–110)
Creat: 0.58 mg/dL (ref 0.50–0.97)
Glucose, Bld: 90 mg/dL (ref 65–99)
Potassium: 3.7 mmol/L (ref 3.5–5.3)
Sodium: 141 mmol/L (ref 135–146)
eGFR: 118 mL/min/{1.73_m2} (ref >=60)

## 2023-01-06 LAB — HEMOGLOBIN A1C
Hgb A1c MFr Bld: 5.7 % of total Hgb — ABNORMAL HIGH (ref <5.7)
Mean Plasma Glucose: 117 mg/dL
eAG (mmol/L): 6.5 mmol/L

## 2023-01-08 ENCOUNTER — Encounter: Payer: Self-pay | Admitting: Family Medicine

## 2023-01-18 ENCOUNTER — Telehealth: Payer: Self-pay | Admitting: Family Medicine

## 2023-01-18 ENCOUNTER — Other Ambulatory Visit: Payer: Self-pay

## 2023-01-18 DIAGNOSIS — B009 Herpesviral infection, unspecified: Secondary | ICD-10-CM

## 2023-01-18 MED ORDER — ACYCLOVIR 400 MG PO TABS: 400.0000 mg | ORAL_TABLET | Freq: Three times a day (TID) | ORAL | 5 refills | Status: DC

## 2023-01-18 NOTE — Telephone Encounter (Signed)
Prescription Request  01/18/2023  LOV: 01/05/2023  What is the name of the medication or equipment?   acyclovir (ZOVIRAX) 400 MG tablet [[id number]]  DISCONTINUED  **New script needed** **Patient must show ID to pick up as per script from pharmacy**  Have you contacted your pharmacy to request a refill? Yes   Which pharmacy would you like this sent to?  [id number] - [place], [place] - [address] AT [facility] [address]  [place] [address] Phone: [phone] Fax: [phone]    Patient notified that their request is being sent to the clinical staff for review and that they should receive a response within 2 business days.   Please advise pharmacist.

## 2023-02-25 ENCOUNTER — Ambulatory Visit: Payer: 59 | Admitting: Internal Medicine

## 2023-04-05 ENCOUNTER — Ambulatory Visit: Payer: 59 | Admitting: Internal Medicine

## 2023-04-06 ENCOUNTER — Emergency Department (HOSPITAL_COMMUNITY): Payer: 59

## 2023-04-06 ENCOUNTER — Encounter (HOSPITAL_COMMUNITY): Payer: Self-pay | Admitting: *Deleted

## 2023-04-06 ENCOUNTER — Emergency Department (HOSPITAL_COMMUNITY)
Admission: EM | Admit: 2023-04-06 | Discharge: 2023-04-06 | Disposition: A | Discharge status: Home / Self Care | Payer: 59 | Attending: Emergency Medicine | Admitting: Emergency Medicine

## 2023-04-06 ENCOUNTER — Other Ambulatory Visit: Payer: Self-pay

## 2023-04-06 DIAGNOSIS — Y92410 Unspecified street and highway as the place of occurrence of the external cause: Secondary | ICD-10-CM | POA: Insufficient documentation

## 2023-04-06 DIAGNOSIS — M546 Pain in thoracic spine: Secondary | ICD-10-CM | POA: Insufficient documentation

## 2023-04-06 DIAGNOSIS — R079 Chest pain, unspecified: Secondary | ICD-10-CM | POA: Diagnosis not present

## 2023-04-06 DIAGNOSIS — S199XXA Unspecified injury of neck, initial encounter: Secondary | ICD-10-CM | POA: Diagnosis present

## 2023-04-06 DIAGNOSIS — S12400A Unspecified displaced fracture of fifth cervical vertebra, initial encounter for closed fracture: Secondary | ICD-10-CM | POA: Insufficient documentation

## 2023-04-06 LAB — CBC WITH DIFFERENTIAL/PLATELET
Abs Immature Granulocytes: 0.04 10*3/uL (ref 0.00–0.07)
Basophils Absolute: 0 10*3/uL (ref 0.0–0.1)
Basophils Relative: 0 %
Eosinophils Absolute: 0.1 10*3/uL (ref 0.0–0.5)
Eosinophils Relative: 1 %
HCT: 37.6 % (ref 36.0–46.0)
Hemoglobin: 12.2 g/dL (ref 12.0–15.0)
Immature Granulocytes: 0 %
Lymphocytes Relative: 13 %
Lymphs Abs: 1.4 10*3/uL (ref 0.7–4.0)
MCH: 25.2 pg — ABNORMAL LOW (ref 26.0–34.0)
MCHC: 32.4 g/dL (ref 30.0–36.0)
MCV: 77.5 fL — ABNORMAL LOW (ref 80.0–100.0)
Monocytes Absolute: 0.6 10*3/uL (ref 0.1–1.0)
Monocytes Relative: 6 %
Neutro Abs: 8 10*3/uL — ABNORMAL HIGH (ref 1.7–7.7)
Neutrophils Relative %: 80 %
Platelets: 270 10*3/uL (ref 150–400)
RBC: 4.85 MIL/uL (ref 3.87–5.11)
RDW: 13.8 % (ref 11.5–15.5)
WBC: 10.1 10*3/uL (ref 4.0–10.5)
nRBC: 0 % (ref 0.0–0.2)

## 2023-04-06 LAB — I-STAT CHEM 8, ED
BUN: 8 mg/dL (ref 6–20)
Calcium, Ion: 1.18 mmol/L (ref 1.15–1.40)
Chloride: 106 mmol/L (ref 98–111)
Creatinine, Ser: 0.6 mg/dL (ref 0.44–1.00)
Glucose, Bld: 105 mg/dL — ABNORMAL HIGH (ref 70–99)
HCT: 36 % (ref 36.0–46.0)
Hemoglobin: 12.2 g/dL (ref 12.0–15.0)
Potassium: 3.4 mmol/L — ABNORMAL LOW (ref 3.5–5.1)
Sodium: 140 mmol/L (ref 135–145)
TCO2: 22 mmol/L (ref 22–32)

## 2023-04-06 LAB — URINALYSIS, ROUTINE W REFLEX MICROSCOPIC
Glucose, UA: NEGATIVE mg/dL
Ketones, ur: NEGATIVE mg/dL
Nitrite: NEGATIVE
Protein, ur: 100 mg/dL — AB
Specific Gravity, Urine: >1.03 — ABNORMAL HIGH (ref 1.005–1.030)
pH: 6 (ref 5.0–8.0)

## 2023-04-06 LAB — COMPREHENSIVE METABOLIC PANEL
ALT: 24 U/L (ref 0–44)
AST: 20 U/L (ref 15–41)
Albumin: 3.6 g/dL (ref 3.5–5.0)
Alkaline Phosphatase: 40 U/L (ref 38–126)
Anion gap: 8 (ref 5–15)
BUN: 10 mg/dL (ref 6–20)
CO2: 23 mmol/L (ref 22–32)
Calcium: 8.4 mg/dL — ABNORMAL LOW (ref 8.9–10.3)
Chloride: 104 mmol/L (ref 98–111)
Creatinine, Ser: 0.66 mg/dL (ref 0.44–1.00)
GFR, Estimated: >60 mL/min (ref >60)
Glucose, Bld: 110 mg/dL — ABNORMAL HIGH (ref 70–99)
Potassium: 3.4 mmol/L — ABNORMAL LOW (ref 3.5–5.1)
Sodium: 135 mmol/L (ref 135–145)
Total Bilirubin: 1.2 mg/dL (ref 0.3–1.2)
Total Protein: 6.7 g/dL (ref 6.5–8.1)

## 2023-04-06 LAB — URINALYSIS, MICROSCOPIC (REFLEX)

## 2023-04-06 LAB — TROPONIN I (HIGH SENSITIVITY): Troponin I (High Sensitivity): 4 ng/L (ref <18)

## 2023-04-06 MED ORDER — OXYCODONE-ACETAMINOPHEN 5-325 MG PO TABS: 1.0000 | ORAL_TABLET | Freq: Four times a day (QID) | ORAL | 0 refills | Status: DC | PRN

## 2023-04-06 MED ORDER — HYDROMORPHONE HCL 1 MG/ML IJ SOLN
1.0000 mg | Freq: Once | INTRAMUSCULAR | Status: AC
  Administered 2023-04-06: 1 mg via INTRAVENOUS
  Filled 2023-04-06: qty 1

## 2023-04-06 MED ORDER — IOHEXOL 300 MG/ML  SOLN
100.0000 mL | Freq: Once | INTRAMUSCULAR | Status: AC | PRN
  Administered 2023-04-06: 100 mL via INTRAVENOUS

## 2023-04-06 MED ORDER — SODIUM CHLORIDE 0.9 % IV BOLUS
1000.0000 mL | Freq: Once | INTRAVENOUS | Status: AC
  Administered 2023-04-06: 1000 mL via INTRAVENOUS

## 2023-04-06 MED ORDER — ONDANSETRON HCL 4 MG/2ML IJ SOLN: 4.0000 mg | Freq: Once | INTRAMUSCULAR | Status: AC

## 2023-04-06 MED ORDER — HYDROMORPHONE HCL 1 MG/ML IJ SOLN
0.5000 mg | Freq: Once | INTRAMUSCULAR | Status: AC
  Administered 2023-04-06: 0.5 mg via INTRAVENOUS
  Filled 2023-04-06: qty 0.5

## 2023-04-06 MED ORDER — ONDANSETRON HCL 4 MG/2ML IJ SOLN
INTRAMUSCULAR | Status: AC
  Administered 2023-04-06: 4 mg via INTRAVENOUS
  Filled 2023-04-06: qty 2

## 2023-04-06 NOTE — Discharge Instructions (Signed)
Follow-up with Dr. Conchita Paris in the next 2 weeks.
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
## 2023-06-25 ENCOUNTER — Ambulatory Visit: Payer: 59 | Admitting: Family Medicine

## 2023-07-13 ENCOUNTER — Ambulatory Visit: Payer: Self-pay

## 2023-08-30 ENCOUNTER — Encounter: Payer: Self-pay | Admitting: Family Medicine

## 2023-08-30 ENCOUNTER — Ambulatory Visit
Admission: EM | Admit: 2023-08-30 | Discharge: 2023-08-30 | Disposition: A | Discharge status: Home / Self Care | Attending: Nurse Practitioner | Admitting: Nurse Practitioner

## 2023-08-30 DIAGNOSIS — K047 Periapical abscess without sinus: Secondary | ICD-10-CM | POA: Diagnosis not present

## 2023-08-30 MED ORDER — AMOXICILLIN-POT CLAVULANATE 875-125 MG PO TABS: 1.0000 | ORAL_TABLET | Freq: Two times a day (BID) | ORAL | 0 refills | Status: AC

## 2023-08-30 NOTE — Discharge Instructions (Addendum)
 Take the Augmentin twice daily for 7 days as prescribed to treat for dental infection.  Continue ibuprofen 800 mg every 8 hours alternating with Tylenol 212 [id number] mg every 6 hours for pain in your tooth.  Seek care if symptoms worsen despite treatment.

## 2023-08-30 NOTE — ED Triage Notes (Signed)
 Tooth pain x 1 week. Pt states she broke/ cracked a tooth on her upper left side. Taking ibuprofen with no relief of pain.

## 2023-08-30 NOTE — ED Provider Notes (Signed)
 [facility]    CSN: [id number] Arrival date & time: 08/30/23  1347      History   Chief Complaint Chief Complaint  Patient presents with   Dental Pain    HPI Stacy [name] is a 38 y.o. female.   Patient presents today with 1 week history of left upper jaw pain.  She reports that a tooth in her upper left jaw broke a few months ago and she has been attempting to get in with a oral surgeon who takes her insurance.  Reports she is planning to get a referral from her primary care provider for an oral surgeon that she found in [facility].  Reports over the past week, she has noticed swelling and tactile fevers at nighttime associated with the broken tooth.  She denies nausea or vomiting.  She reports the pain is significant and she has been taking high doses of ibuprofen with minimal temporary improvement.  No sinus pain or pus draining inside her mouth.    Past Medical History:  Diagnosis Date   Carpal tunnel syndrome    Diverticulosis    Migraine    Obesity     Patient Active Problem List   Diagnosis Date Noted   Pain of left hand 09/15/2022   Acute postoperative pain 07/17/2022   Bilateral carpal tunnel syndrome 06/23/2022   HSV-2 (herpes simplex virus 2) infection 08/07/2015   Precordial pain 05/31/2013   Obesity    Carpal tunnel syndrome     Past Surgical History:  Procedure Laterality Date   CESAREAN SECTION     CESAREAN SECTION     CHOLECYSTECTOMY     MOUTH SURGERY     TUBAL LIGATION     WISDOM TOOTH EXTRACTION      OB History     Gravida  2   Para  2   Term  2   Preterm      AB      Living  2      SAB      IAB      Ectopic      Multiple      Live Births               Home Medications    Prior to Admission medications   Medication Sig Start Date End Date Taking? Authorizing Provider  acetaminophen (TYLENOL) 500 MG tablet Take 500 mg by mouth every 6 (six) hours as needed.   Yes [provider]   amoxicillin-clavulanate (AUGMENTIN) 875-125 MG tablet Take 1 tablet by mouth 2 (two) times daily for 7 days. 08/30/23 09/06/23 Yes Cathlean Marseilles A, NP  ibuprofen (ADVIL) 200 MG tablet Take 200 mg by mouth every 6 (six) hours as needed.   Yes [provider]  acyclovir (ZOVIRAX) 400 MG tablet Take 1 tablet (400 mg total) by mouth 3 (three) times daily. 01/18/23   Donita Brooks, MD  fluticasone (FLONASE) 50 MCG/ACT nasal spray Place 2 sprays into both nostrils daily. Patient not taking: Reported on 01/05/2023 06/17/22   Leath-Warren, Sadie Haber, NP  guaiFENesin (MUCINEX) 600 MG 12 hr tablet Take by mouth 2 (two) times daily. Patient not taking: Reported on 01/05/2023    [provider]  naproxen (NAPROSYN) 500 MG tablet Take 1 tablet (500 mg total) by mouth 2 (two) times daily. Patient not taking: Reported on 01/05/2023 12/27/22   Carmel Sacramento A, PA-C  oxyCODONE-acetaminophen (PERCOCET/ROXICET) 5-325 MG tablet Take 1 tablet by mouth every  6 (six) hours as needed for severe pain (pain score 7-10). 04/06/23   Bethann Berkshire, MD  promethazine (PHENERGAN) 25 MG tablet Take 1 tablet (25 mg total) by mouth every 6 (six) hours as needed for nausea or vomiting. 06/16/18 12/07/19  Burgess Amor, PA-C    Family History Family History  Problem Relation Age of Onset   Diabetes Mother    Hypertension Mother    Hypertension Father     Social History Social History   Tobacco Use   Smoking status: Former    Current packs/day: 0.00    Average packs/day: (0.1 ttl pk-yrs)    Types: Cigarettes    Start date: 05/22/2009    Quit date: 11/21/2010    Years since quitting: 12.7   Smokeless tobacco: Never  Vaping Use   Vaping status: Never Used  Substance Use Topics   Alcohol use: No    Alcohol/week: 0.0 standard drinks of alcohol   Drug use: No     Allergies   Coconut flavoring agent (non-screening) and Pineapple   Review of Systems Review of Systems Per HPI  Physical  Exam Triage Vital Signs ED Triage Vitals  Encounter Vitals Group     BP 08/30/23 1459 (!) 145/88     Systolic BP Percentile --      Diastolic BP Percentile --      Pulse Rate 08/30/23 1450 68     Resp 08/30/23 1450 16     Temp 08/30/23 1450 97.6 F (36.4 C)     Temp Source 08/30/23 1450 Oral     SpO2 08/30/23 1450 98 %     Weight --      Height --      Head Circumference --      Peak Flow --      Pain Score 08/30/23 1455 10     Pain Loc --      Pain Education --      Exclude from Growth Chart --    No data found.  Updated Vital Signs BP (!) 145/88   Pulse 68   Temp 97.6 F (36.4 C) (Oral)   Resp 16   LMP 08/30/2023 (Exact Date)   SpO2 98%   Visual Acuity Right Eye Distance:   Left Eye Distance:   Bilateral Distance:    Right Eye Near:   Left Eye Near:    Bilateral Near:     Physical Exam Vitals and nursing note reviewed.  Constitutional:      General: She is not in acute distress.    Appearance: Normal appearance. She is not toxic-appearing.  HENT:     Right Ear: External ear normal.     Left Ear: External ear normal.     Mouth/Throat:     Mouth: Mucous membranes are moist. No oral lesions.     Dentition: Abnormal dentition. Dental tenderness, gingival swelling and dental caries present. No dental abscesses or gum lesions.     Pharynx: Oropharynx is clear.      Comments: Swelling noted to area marked; no obvious dental abscess Pulmonary:     Effort: Pulmonary effort is normal. No respiratory distress.  Musculoskeletal:     Cervical back: Normal range of motion.  Lymphadenopathy:     Cervical: No cervical adenopathy.  Skin:    General: Skin is warm and dry.     Capillary Refill: Capillary refill takes less than 2 seconds.  Neurological:     Mental Status: She is alert and oriented to  person, place, and time.  Psychiatric:        Behavior: Behavior is cooperative.      UC Treatments / Results  Labs (all labs ordered are listed, but only  abnormal results are displayed) Labs Reviewed - No data to display  EKG   Radiology No results found.  Procedures Procedures (including critical care time)  Medications Ordered in UC Medications - No data to display  Initial Impression / Assessment and Plan / UC Course  I have reviewed the triage vital signs and the nursing notes.  Pertinent labs & imaging results that were available during my care of the patient were reviewed by me and considered in my medical decision making (see chart for details).   Patient is well-appearing, normotensive, afebrile, not tachycardic, not tachypneic, oxygenating well on room air.    1. Dental abscess Will treat with Augmentin twice daily for 7 days Other supportive care discussed including cool compresses, Tylenol alternating with ibuprofen Recommended reaching out to PCP for formal referral to oral surgeon in [facility] as we cannot place referral from urgent care  The patient was given the opportunity to ask questions.  All questions answered to their satisfaction.  The patient is in agreement to this plan.    Final Clinical Impressions(s) / UC Diagnoses   Final diagnoses:  Dental abscess     Discharge Instructions      Take the Augmentin twice daily for 7 days as prescribed to treat for dental infection.  Continue ibuprofen 800 mg every 8 hours alternating with Tylenol [phone] mg every 6 hours for pain in your tooth.  Seek care if symptoms worsen despite treatment.   ED Prescriptions     Medication Sig Dispense Auth. Provider   amoxicillin-clavulanate (AUGMENTIN) 875-125 MG tablet Take 1 tablet by mouth 2 (two) times daily for 7 days. 14 tablet Valentino Nose, NP      PDMP not reviewed this encounter.   Valentino Nose, NP 08/30/23 1538

## 2023-08-31 ENCOUNTER — Other Ambulatory Visit: Payer: Self-pay | Admitting: Family Medicine

## 2023-08-31 DIAGNOSIS — S025XXB Fracture of tooth (traumatic), initial encounter for open fracture: Secondary | ICD-10-CM

## 2023-09-16 ENCOUNTER — Encounter: Payer: Self-pay | Admitting: Family Medicine

## 2023-09-16 ENCOUNTER — Other Ambulatory Visit: Payer: Self-pay | Admitting: Family Medicine

## 2023-09-16 MED ORDER — FLUCONAZOLE 150 MG PO TABS: 150.0000 mg | ORAL_TABLET | Freq: Once | ORAL | 0 refills | Status: AC

## 2023-10-31 ENCOUNTER — Emergency Department (HOSPITAL_COMMUNITY)
Admission: EM | Admit: 2023-10-31 | Discharge: 2023-10-31 | Disposition: A | Discharge status: Home / Self Care | Attending: Emergency Medicine | Admitting: Emergency Medicine

## 2023-10-31 ENCOUNTER — Encounter (HOSPITAL_COMMUNITY): Payer: Self-pay | Admitting: *Deleted

## 2023-10-31 ENCOUNTER — Other Ambulatory Visit: Payer: Self-pay

## 2023-10-31 ENCOUNTER — Emergency Department (HOSPITAL_COMMUNITY)

## 2023-10-31 DIAGNOSIS — W010XXA Fall on same level from slipping, tripping and stumbling without subsequent striking against object, initial encounter: Secondary | ICD-10-CM | POA: Diagnosis not present

## 2023-10-31 DIAGNOSIS — Y9302 Activity, running: Secondary | ICD-10-CM | POA: Insufficient documentation

## 2023-10-31 DIAGNOSIS — S8392XA Sprain of unspecified site of left knee, initial encounter: Secondary | ICD-10-CM | POA: Insufficient documentation

## 2023-10-31 DIAGNOSIS — M25562 Pain in left knee: Secondary | ICD-10-CM | POA: Diagnosis present

## 2023-10-31 MED ORDER — ACETAMINOPHEN 500 MG PO TABS
1000.0000 mg | ORAL_TABLET | Freq: Once | ORAL | Status: AC
  Administered 2023-10-31: 1000 mg via ORAL
  Filled 2023-10-31: qty 2

## 2023-10-31 NOTE — ED Provider Notes (Signed)
 [place] EMERGENCY DEPARTMENT AT [facility] Provider Note   CSN: [id number] Arrival date & time: 10/31/23  2108     History  Chief Complaint  Patient presents with   Coler[place]    Stacy [name] is a 38 y.o. female.  Presents the ER for evaluation of left knee pain after a twisting injury and fall today.  She was running in her backyard with her grandchild, did not see a hole, which she subsequently stepped in and tripped.  She states she twisted her knee and describes an eversion of the knee and fell to the ground.  She was able to get up and has been able to ambulate but states she is limping.  Denies pain in the foot or ankle or hip on the left side.  She states she did notice maybe some swelling to the left lateral ankle but denies any tenderness or pain with ambulation.  Pain is described as burning and across the anterior aspect of the left knee.  The 800 mg of ibuprofen  prior to arrival with mild improvement.   Fall       Home Medications Prior to Admission medications   Medication Sig Start Date End Date Taking? Authorizing Provider  acetaminophen  (TYLENOL ) 500 MG tablet Take 500 mg by mouth every 6 (six) hours as needed.    [provider]  acyclovir  (ZOVIRAX ) 400 MG tablet Take 1 tablet (400 mg total) by mouth 3 (three) times daily. 01/18/23   Austine Lefort, MD  fluticasone  (FLONASE ) 50 MCG/ACT nasal spray Place 2 sprays into both nostrils daily. Patient not taking: Reported on 01/05/2023 06/17/22   Leath-Warren, Belen Bowers, NP  guaiFENesin  (MUCINEX ) 600 MG 12 hr tablet Take by mouth 2 (two) times daily. Patient not taking: Reported on 01/05/2023    [provider]  ibuprofen  (ADVIL ) 200 MG tablet Take 200 mg by mouth every 6 (six) hours as needed.    [provider]  naproxen  (NAPROSYN ) 500 MG tablet Take 1 tablet (500 mg total) by mouth 2 (two) times daily. Patient not taking: Reported on 01/05/2023 12/27/22   Baxter Limber A, PA-C   oxyCODONE -acetaminophen  (PERCOCET/ROXICET) 5-325 MG tablet Take 1 tablet by mouth every 6 (six) hours as needed for severe pain (pain score 7-10). 04/06/23   Cheyenne Cotta, MD  promethazine  (PHENERGAN ) 25 MG tablet Take 1 tablet (25 mg total) by mouth every 6 (six) hours as needed for nausea or vomiting. 06/16/18 12/07/19  Idol, Julie, PA-C      Allergies    Coconut flavoring agent (non-screening) and Pineapple    Review of Systems   Review of Systems  Physical Exam Updated Vital Signs BP (!) 155/87 (BP Location: Right Arm)   Pulse 63   Temp 97.9 F (36.6 C) (Oral)   Resp 16   Ht 5\' 7"  (1.702 m)   Wt 111.1 kg   LMP 10/24/2023 (Approximate)   SpO2 97%   BMI 38.37 kg/m  Physical Exam Vitals and nursing note reviewed.  Constitutional:      General: She is not in acute distress.    Appearance: She is well-developed.  HENT:     Head: Normocephalic and atraumatic.     Mouth/Throat:     Mouth: Mucous membranes are moist.  Eyes:     Conjunctiva/sclera: Conjunctivae normal.  Cardiovascular:     Rate and Rhythm: Normal rate and regular rhythm.     Heart sounds: No murmur heard. Pulmonary:     Effort:  Pulmonary effort is normal. No respiratory distress.     Breath sounds: Normal breath sounds.  Abdominal:     Palpations: Abdomen is soft.     Tenderness: There is no abdominal tenderness.  Musculoskeletal:        General: No swelling.     Cervical back: Neck supple.     Comments: There is no swelling or ecchymosis of the left knee.  Tenderness is mild across the anterior knee, slightly worse over the medial aspect.  Patient fully flex and extend without difficulty.  Patellar apprehension test is negative, anterior drawer test negative.  There is no calf tenderness, very minimal swelling to left lateral ankle but no bony tenderness of the ankle.  No tenderness of the Achilles.  Skin:    General: Skin is warm and dry.     Capillary Refill: Capillary refill takes less than 2  seconds.  Neurological:     General: No focal deficit present.     Mental Status: She is alert and oriented to person, place, and time.  Psychiatric:        Mood and Affect: Mood normal.        Behavior: Behavior normal.     ED Results / Procedures / Treatments   Labs (all labs ordered are listed, but only abnormal results are displayed) Labs Reviewed - No data to display  EKG None  Radiology DG Knee Complete 4 Views Left Result Date: 10/31/2023 CLINICAL DATA:  Pain after fall. EXAM: LEFT KNEE - COMPLETE 4+ VIEW COMPARISON:  None Available. FINDINGS: No evidence of fracture, dislocation, or joint effusion. The alignment and joint spaces are preserved. No evidence of arthropathy or other focal bone abnormality. No focal soft tissue abnormality. IMPRESSION: Negative radiographs of the left knee. Electronically Signed   By: Chadwick Colonel M.D.   On: 10/31/2023 22:25    Procedures Procedures    Medications Ordered in ED Medications  acetaminophen  (TYLENOL ) tablet 1,000 mg (has no administration in time range)    ED Course/ Medical Decision Making/ A&P                                 Medical Decision Making Differential diagnosis includes but not limited to fracture, sprain, strain, dislocation, meniscal tear, other  ED course: Patient had twisting injury to the left knee today while running outside and tripping in a hole.  She is able to bear weight and ambulate and has a stable knee exam.  X-ray showed no acute findings.  Will treat conservatively, she took ibuprofen  at home, given knee immobilizer and crutches.  Advised on PCP follow-up, orthopedic follow-up if not improving.  She was given strict return precautions.  Amount and/or Complexity of Data Reviewed External Data Reviewed: notes. Radiology: ordered and independent interpretation performed.    Details: No fracture, no joint effusion, no malalignment, agree with radiology read  Risk OTC  drugs.           Final Clinical Impression(s) / ED Diagnoses Final diagnoses:  Sprain of left knee, unspecified ligament, initial encounter    Rx / DC Orders ED Discharge Orders     None         Aimee Houseman, PA-C 10/31/23 2300    Merdis Stalling, MD 10/31/23 2332

## 2023-10-31 NOTE — ED Notes (Signed)
Pt refused the crutches ? ?

## 2023-10-31 NOTE — Discharge Instructions (Addendum)
 You are seen in the emergency department today evaluation of left knee pain after a twisting injury.  Fortunately your x-ray does not show any fracture, dislocation or fluid in the joint.  You likely have a soft tissue injury, we will give you crutches and a knee immobilizer.  Rest the knee, you can take over-the-counter Tylenol  ibuprofen  as needed for discomfort.  Follow-up with your PCP, if you are not improving you should also follow-up with orthopedics.

## 2023-10-31 NOTE — ED Triage Notes (Signed)
 Pt fell in a hole while outside, c/o left knee pain and left foot swelling. Denies hitting her head with fall.

## 2023-11-11 ENCOUNTER — Ambulatory Visit: Admitting: Family Medicine

## 2023-11-26 ENCOUNTER — Ambulatory Visit: Admitting: Family Medicine

## 2023-12-10 ENCOUNTER — Ambulatory Visit: Admitting: Family Medicine

## 2023-12-30 ENCOUNTER — Ambulatory Visit: Admitting: Family Medicine

## 2023-12-30 ENCOUNTER — Encounter: Payer: Self-pay | Admitting: Family Medicine

## 2023-12-30 VITALS — BP 124/80 | HR 63 | Temp 98.8°F | Ht 67.0 in | Wt 252.0 lb

## 2023-12-30 DIAGNOSIS — B009 Herpesviral infection, unspecified: Secondary | ICD-10-CM | POA: Diagnosis not present

## 2023-12-30 DIAGNOSIS — M722 Plantar fascial fibromatosis: Secondary | ICD-10-CM | POA: Diagnosis not present

## 2023-12-30 MED ORDER — ACYCLOVIR 400 MG PO TABS: 400.0000 mg | ORAL_TABLET | Freq: Three times a day (TID) | ORAL | 5 refills | Status: DC

## 2023-12-30 NOTE — Progress Notes (Signed)
 Subjective:    Patient ID: Stacy [name], female    DOB: 1986/05/08, 38 y.o.   MRN: [id number]  Foot Pain    Patient reports pain in her left foot.  The pain is located on the plantar surface of the calcaneus and radiates to the back of the heel.  It is extremely tender to palpation in that area as diagrammed.  She states that it feels like she is being stabbed with a knife in her heel whenever she gets out of bed in the morning and starts to walk on her foot.  It also hurts when she stands up from a seated position walks.  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  She has been wearing good tennis shoes with arch supports without benefit.  She has been taking ibuprofen  Past Medical History:  Diagnosis Date   Anxiety    Carpal tunnel syndrome    Depression    Diverticulosis    Migraine    Obesity    Past Surgical History:  Procedure Laterality Date   CESAREAN SECTION     CESAREAN SECTION     CHOLECYSTECTOMY     MOUTH SURGERY     TUBAL LIGATION     WISDOM TOOTH EXTRACTION     Current Outpatient Medications on File Prior to Visit  Medication Sig Dispense Refill   [DISCONTINUED] promethazine  (PHENERGAN ) 25 MG tablet Take 1 tablet (25 mg total) by mouth every 6 (six) hours as needed for nausea or vomiting. 30 tablet 0   No current facility-administered medications on file prior to visit.   Allergies  Allergen Reactions   Coconut Flavoring Agent (Non-Screening) Other (See Comments)    Mouth/throat lesions    Pineapple Other (See Comments)    Mouth/throat leasions   Social History   Socioeconomic History   Marital status: Single    Spouse name: Not on file   Number of children: 2   Years of education: 10th    Highest education level: 11th grade  Occupational History   Occupation: Solicitor member at [employer]  Tobacco Use   Smoking status: Former    Current packs/day: 0.00    Average packs/day: (0.1 ttl pk-yrs)    Types: Cigarettes    Start date: 05/22/2009     Quit date: 11/21/2010    Years since quitting: 13.1   Smokeless tobacco: Never  Vaping Use   Vaping status: Never Used  Substance and Sexual Activity   Alcohol use: No    Alcohol/week: 0.0 standard drinks of alcohol   Drug use: No   Sexual activity: Yes    Birth control/protection: Surgical  Other Topics Concern   Not on file  Social History Narrative   Not on file   Social Drivers of Health   Financial Resource Strain: Low Risk  (12/29/2023)   Overall Financial Resource Strain (CARDIA)    Difficulty of Paying Living Expenses: Not hard at all  Food Insecurity: No Food Insecurity (12/29/2023)   Hunger Vital Sign    Worried About Running Out of Food in the Last Year: Never true    Ran Out of Food in the Last Year: Never true  Transportation Needs: No Transportation Needs (12/29/2023)   PRAPARE - Administrator, Civil Service (Medical): No    Lack of Transportation (Non-Medical): No  Physical Activity: Insufficiently Active (12/29/2023)   Exercise Vital Sign    Days of Exercise per Week: 2 days    Minutes of  Exercise per Session: 10 min  Stress: Stress Concern Present (12/29/2023)   [employer] of Occupational Health - Occupational Stress Questionnaire    Feeling of Stress: To some extent  Social Connections: Moderately Isolated (12/29/2023)   Social Connection and Isolation Panel    Frequency of Communication with Friends and Family: More than three times a week    Frequency of Social Gatherings with Friends and Family: Once a week    Attends Religious Services: Never    Database administrator or Organizations: No    Attends Engineer, structural: Not on file    Marital Status: Living with partner  Intimate Partner Violence: Not on file     Review of Systems  All other systems reviewed and are negative.      Objective:   Physical Exam Constitutional:      Appearance: Normal appearance. She is obese. She is not ill-appearing, toxic-appearing or  diaphoretic.  Cardiovascular:     Rate and Rhythm: Normal rate and regular rhythm.     Pulses: Normal pulses.          Dorsalis pedis pulses are 2+ on the right side and 2+ on the left side.       Posterior tibial pulses are 2+ on the right side and 2+ on the left side.     Heart sounds: Normal heart sounds.  Pulmonary:     Effort: Pulmonary effort is normal.     Breath sounds: Normal breath sounds.  Abdominal:     General: Bowel sounds are normal. There is no distension.     Palpations: Abdomen is soft. There is no mass.     Tenderness: There is no abdominal tenderness. There is no guarding or rebound.  Musculoskeletal:     Right foot: No deformity.     Left foot: Normal range of motion. No deformity.       Feet:  Neurological:     Mental Status: She is alert.           Assessment & Plan:  Plantar fasciitis - Plan: Ambulatory referral to Podiatry  HSV (herpes simplex virus) infection - Plan: acyclovir  (ZOVIRAX ) 400 MG tablet Patient has plantar fasciitis.  I offered the patient a cortisone injection today but she declined this.  She would like to see a podiatrist to discuss her treatment options.  Meanwhile recommended a series of stretches that she can do every day to try to limber up the ligaments in her foot to help reduce the pain.  Also recommend a night splint for plantar fasciitis to be worn at night

## 2024-01-13 ENCOUNTER — Ambulatory Visit (INDEPENDENT_AMBULATORY_CARE_PROVIDER_SITE_OTHER): Admitting: Podiatry

## 2024-01-13 ENCOUNTER — Ambulatory Visit (INDEPENDENT_AMBULATORY_CARE_PROVIDER_SITE_OTHER)

## 2024-01-13 ENCOUNTER — Encounter: Payer: Self-pay | Admitting: Podiatry

## 2024-01-13 DIAGNOSIS — M7662 Achilles tendinitis, left leg: Secondary | ICD-10-CM | POA: Diagnosis not present

## 2024-01-13 DIAGNOSIS — M7732 Calcaneal spur, left foot: Secondary | ICD-10-CM

## 2024-01-13 DIAGNOSIS — M722 Plantar fascial fibromatosis: Secondary | ICD-10-CM | POA: Diagnosis not present

## 2024-01-13 MED ORDER — TRIAMCINOLONE ACETONIDE 10 MG/ML IJ SUSP
10.0000 mg | Freq: Once | INTRAMUSCULAR | Status: AC
  Administered 2024-01-13: 10 mg

## 2024-01-13 NOTE — Progress Notes (Signed)
 Patient presents with complaint of pain on the plantar aspect of the heel and arch and also posterior aspect of the heel.  Pain began on the heel many months ago and now has been getting into the arch and even the back of the heel.  Does not recall any injury to it.  She has tried icing and doing some exercises.  Has not noticed any redness or ecchymosis.   Physical exam:  General appearance: Pleasant, and in no acute distress. AOx3.  Vascular: Pedal pulses: DP 2/4 bilaterally, PT 2/4 bilaterally.  Mild to moderate edema lower legs bilaterally. Capillary fill time immediate bilaterally.  Neurological: Light touch intact feet bilaterally.  Normal Achilles reflex bilaterally.  No clonus or spasticity noted.  Negative Tinel's sign tarsal tunnel and porta pedis left  Dermatologic:   Skin normal temperature bilaterally.  Skin normal color, tone, and texture bilaterally.   Musculoskeletal: Tenderness plantar medial aspect heel at the plantar calcaneal tubercle.  Tenderness on the posterior aspect of the heel at the insertion of the Achilles tendon.  No defects in Achilles tendon noted.  Tenderness along the plantar fascial band along the full length.  No fibromas noted  Radiographs: 3 views left foot:  osteophytic changes plantar aspect calcaneus.  No erosive changes or evidence of fractures.  Thickening of plantar fascia.  Kager's triangle normal.  Diagnosis: 1.  Plantar fasciitis left 2.  Achilles tendinitis left. 3.  Calcaneal spur left.  Plan: -New patient office visit level 3 for evaluation management.  Modifier 25 - Discussed with the plantar fasciitis and the  Achilles tendinitis on the left.  Discussed conservative or surgical treatment.  Explained that this can usually be dealt with conservatively.  Discussed with her proper shoe gear. -RICE -Dispensed OTC inserts for shoes bilaterally -Gave written wound care instructions for exercises and at home PT she can do -injected 3cc 2:1  mixture 0.5 cc Marcaine :Kenolog 10mg /25ml at medial origin plantar fascia at medial calcaneal tubercle left.    Return 2 weeks follow-up injection plantar fascia left

## 2024-01-13 NOTE — Patient Instructions (Signed)

## 2024-01-27 ENCOUNTER — Ambulatory Visit: Admitting: Podiatry

## 2024-02-03 ENCOUNTER — Ambulatory Visit (INDEPENDENT_AMBULATORY_CARE_PROVIDER_SITE_OTHER): Admitting: Podiatry

## 2024-02-03 DIAGNOSIS — M722 Plantar fascial fibromatosis: Secondary | ICD-10-CM

## 2024-02-03 MED ORDER — PREDNISONE 5 MG PO TABS: ORAL_TABLET | ORAL | 0 refills | Status: DC

## 2024-02-03 NOTE — Addendum Note (Signed)
 Addended by: GERRIT ANDREZ CROME on: 02/03/2024 04:25 PM   Modules accepted: Orders

## 2024-02-03 NOTE — Progress Notes (Signed)
 Presents today follow-up plantar fasciitis and injection on the left.  She had a lot of pain for 2 or 3 days after the injection.  Probably a steroid flare.   Physical exam:  General appearance: Pleasant, and in no acute distress. AOx3.  Vascular: Pedal pulses: DP 2/4 bilaterally, PT 2/4 bilaterally.   Neurological: Negative Tinel sign tarsal tunnel and porta pedis bilaterally  Dermatologic:   Skin normal temperature bilaterally.  Skin normal color, tone, and texture bilaterally.   Musculoskeletal: Tenderness at the plantar medial aspect of the heel at the point medial plantar.  No tenderness with compression of the calcaneus.  No posterior heel pain.   Diagnosis: 1.  Plantar fasciitis left  Plan: -Established office visit for evaluation and management level 3. -Rx prednisone  5 mg, 30 mg p.o. daily first day, then decrease by 5 mg every other day for 12 days. -Dispensed night splint left  Return 2 weeks plantar fasciitis left

## 2024-02-17 ENCOUNTER — Ambulatory Visit: Admitting: Podiatry

## 2024-02-28 ENCOUNTER — Ambulatory Visit (INDEPENDENT_AMBULATORY_CARE_PROVIDER_SITE_OTHER): Admitting: Podiatry

## 2024-02-28 ENCOUNTER — Encounter: Payer: Self-pay | Admitting: Podiatry

## 2024-02-28 DIAGNOSIS — M722 Plantar fascial fibromatosis: Secondary | ICD-10-CM | POA: Diagnosis not present

## 2024-02-28 NOTE — Progress Notes (Signed)
 Patient presents follow-up plantar fasciitis.  She did prednisone  at her last visit for 12-day taper.  Had an injection prior to that.  Said the injection actually made it worse.  She is also been icing and doing stretching exercises at home with no relief of symptoms.  She has also tried OTC NSAIDs with no relief of symptoms.  No real significant improvement at this point.  She is also been using a night splint.   Physical exam:  General appearance: Pleasant, and in no acute distress. AOx3.  Vascular: Pedal pulses: DP 2/4 bilaterally, PT 2/4 bilaterally.   Neurological: Negative Mulder's sign tarsal tunnel and porta pedis left  Dermatologic:   Skin normal temperature bilaterally.  Skin normal color, tone, and texture bilaterally.   Musculoskeletal: Tenderness at the plantar medial aspect of the heel at the plantar medial calcaneal tubercle.  No tenderness with lateral compression of calcaneus.  Gastrocnemius equinus present left   Diagnosis: plantar fasciitis left  Plan: -Established office visit for evaluation and management level 3. -Discussed with her continued pain from the plantar fascia.  Has not given any relief with the injection, prednisone , NSAIDs, night splint, and exercises.  I think that at this point surgical intervention might be her best option.  Return for surgical consult for plantar fasciitis left

## 2024-03-08 ENCOUNTER — Encounter: Payer: Self-pay | Admitting: Podiatry

## 2024-03-08 ENCOUNTER — Ambulatory Visit (INDEPENDENT_AMBULATORY_CARE_PROVIDER_SITE_OTHER): Admitting: Podiatry

## 2024-03-08 VITALS — Ht 67.0 in | Wt 252.0 lb

## 2024-03-08 DIAGNOSIS — M722 Plantar fascial fibromatosis: Secondary | ICD-10-CM | POA: Diagnosis not present

## 2024-03-08 DIAGNOSIS — M62462 Contracture of muscle, left lower leg: Secondary | ICD-10-CM | POA: Diagnosis not present

## 2024-03-08 NOTE — Progress Notes (Signed)
 Chief Complaint  Patient presents with   Plantar Fasciitis    Pt is here due to plantar fasciitis in the left foot, she states she has been seeing Dr Christine for this issue wants to discuss surgery options, pain is still in the left foot, states at night foot swells up where she can't walk on it, states it feels like walking on pins and needles.    HPI: 38 y.o. female presenting today for evaluation of chronic severe pain and tenderness associated to the left heel as well as the posterior aspect of the leg.  She has been treated and managed by Dr. SHAUNNA referred to me for possible surgical consultationetery here in our office.   Past Medical History:  Diagnosis Date   Anxiety    Carpal tunnel syndrome    Depression    Diverticulosis    Migraine    Obesity     Past Surgical History:  Procedure Laterality Date   CESAREAN SECTION     CESAREAN SECTION     CHOLECYSTECTOMY     MOUTH SURGERY     TUBAL LIGATION     WISDOM TOOTH EXTRACTION      Allergies  Allergen Reactions   Coconut Flavoring Agent (Non-Screening) Other (See Comments)    Mouth/throat lesions    Pineapple Other (See Comments)    Mouth/throat leasions     Physical Exam: General: The patient is alert and oriented x3 in no acute distress.  Dermatology: Skin is warm, dry and supple bilateral lower extremities.   Vascular: Palpable pedal pulses bilaterally. Capillary refill within normal limits.  No appreciable edema.  No erythema.  Neurological: Grossly intact via light touch  Musculoskeletal Exam: Significant tenderness and pain with palpation to the plantar fascia left.  There is also tenderness along the mid substance of the Achilles tendon left lower extremity with limited ankle joint dorsiflexion and a tight posterior complex.  Assessment/Plan of Care: 1.  Inflammatory plantar fasciitis left 2.  Achilles tendinitis complicated by gastrocnemius equinus left  -Patient evaluated.  X-rays were reviewed that were  taken on 01/13/2024 -Unfortunately patient continues to have pain and tenderness associated to the left foot and ankle.  She has tried multiple conservative modalities including cortisone injections, oral anti-inflammatories, stretching exercises and physical therapy modalities with no improvement or relief of her symptoms.  I do believe that surgery is warranted  at this time -Today we discussed endoscopic plantar fasciotomy as well as gastrocnemius aponeurosis lengthening to the left lower extremity.  Risk benefits advantages and disadvantages were discussed in detail with the patient.  Postoperative recovery course was also explained.  All patient questions were answered.  No guarantees were expressed or implied.  The patient consents would like to proceed with surgery at this time -Authorization for surgery was initiated today.  Surgery will consist of endoscopic plantar fasciotomy left.  Gastrocnemius aponeurosis lengthening left -Patient is planning to take 12 weeks off of work postoperatively -Return to clinic 1 week postop  Education officer, environmental at a [employer]        Thresa EMERSON Sar, DPM [facility]  Dr. Thresa EMERSON Sar, DPM    [address], [place] [address]  Office [phone]  Fax [phone]

## 2024-03-14 ENCOUNTER — Telehealth: Payer: Self-pay | Admitting: Podiatry

## 2024-03-14 NOTE — Telephone Encounter (Signed)
 Pt left message was seen last week and needs surgery and was not sure if she needed to call.   I returned call and pt is scheduled for surgery on 12/4 and is not on blood thinners or GLP 1 medication and pharmacy is correct

## 2024-04-02 ENCOUNTER — Ambulatory Visit
Admission: EM | Admit: 2024-04-02 | Discharge: 2024-04-02 | Disposition: A | Discharge status: Home / Self Care | Attending: Nurse Practitioner | Admitting: Nurse Practitioner

## 2024-04-02 DIAGNOSIS — J0100 Acute maxillary sinusitis, unspecified: Secondary | ICD-10-CM | POA: Diagnosis not present

## 2024-04-02 MED ORDER — AMOXICILLIN-POT CLAVULANATE 875-125 MG PO TABS: 1.0000 | ORAL_TABLET | Freq: Two times a day (BID) | ORAL | 0 refills | Status: DC

## 2024-04-02 MED ORDER — FLUTICASONE PROPIONATE 50 MCG/ACT NA SUSP: 2.0000 | Freq: Every day | NASAL | 0 refills | Status: DC

## 2024-04-02 NOTE — ED Triage Notes (Signed)
 Pt reports she has facial pressure that radiates down to her right ear x 2 days.   Took tylenol  cold and sinus and mucinex 

## 2024-04-02 NOTE — ED Provider Notes (Signed)
 [facility]    CSN: [id number] Arrival date & time: 04/02/24  0857      History   Chief Complaint No chief complaint on file.   HPI Stacy [name] is a 38 y.o. female.   The history is provided by the patient.   Patient presents with a 2-day history of fatigue, facial pressure and right ear pain.  Patient reports low-grade temperature of around 99.  She denies nasal congestion, runny nose, dizziness, lightheadedness, or ear drainage.  She does have a mild cough.  So far, states she has been taking Tylenol  Cold and sinus and Mucinex  for her symptoms with minimal relief.  She denies any obvious close sick contacts.  Past Medical History:  Diagnosis Date   Anxiety    Carpal tunnel syndrome    Depression    Diverticulosis    Migraine    Obesity     Patient Active Problem List   Diagnosis Date Noted   Pain of left hand 09/15/2022   Acute postoperative pain 07/17/2022   Bilateral carpal tunnel syndrome 06/23/2022   HSV-2 (herpes simplex virus 2) infection 08/07/2015   Precordial pain 05/31/2013   Obesity    Carpal tunnel syndrome     Past Surgical History:  Procedure Laterality Date   CESAREAN SECTION     CESAREAN SECTION     CHOLECYSTECTOMY     MOUTH SURGERY     TUBAL LIGATION     WISDOM TOOTH EXTRACTION      OB History     Gravida  2   Para  2   Term  2   Preterm      AB      Living  2      SAB      IAB      Ectopic      Multiple      Live Births               Home Medications    Prior to Admission medications   Medication Sig Start Date End Date Taking? Authorizing Provider  amoxicillin -clavulanate (AUGMENTIN ) 875-125 MG tablet Take 1 tablet by mouth every 12 (twelve) hours. 04/02/24  Yes Leath-Warren, Etta PARAS, NP  fluticasone  (FLONASE ) 50 MCG/ACT nasal spray Place 2 sprays into both nostrils daily. 04/02/24  Yes Leath-Warren, Etta PARAS, NP  acyclovir  (ZOVIRAX ) 400 MG tablet Take 1 tablet (400 mg total) by mouth 3  (three) times daily. 12/30/23   Duanne Butler DASEN, MD  predniSONE  (DELTASONE ) 5 MG tablet 12 day taper: 6,6,5,5,4,4,3,3,2,2,1,1 02/03/24   Petery, John, DPM  promethazine  (PHENERGAN ) 25 MG tablet Take 1 tablet (25 mg total) by mouth every 6 (six) hours as needed for nausea or vomiting. 06/16/18 12/07/19  Birdena Clarity, PA-C    Family History Family History  Problem Relation Age of Onset   Diabetes Mother    Hypertension Mother    Kidney disease Mother    Stroke Mother    Hypertension Father     Social History Social History   Tobacco Use   Smoking status: Former    Current packs/day: 0.00    Average packs/day: (0.1 ttl pk-yrs)    Types: Cigarettes    Start date: 05/22/2009    Quit date: 11/21/2010    Years since quitting: 13.3   Smokeless tobacco: Never  Vaping Use   Vaping status: Never Used  Substance Use Topics   Alcohol use: No    Alcohol/week: 0.0 standard drinks of alcohol  Drug use: No     Allergies   Coconut flavoring agent (non-screening) and Pineapple   Review of Systems Review of Systems   Physical Exam Triage Vital Signs ED Triage Vitals  Encounter Vitals Group     BP 04/02/24 0906 133/77     Girls Systolic BP Percentile --      Girls Diastolic BP Percentile --      Boys Systolic BP Percentile --      Boys Diastolic BP Percentile --      Pulse Rate 04/02/24 0906 62     Resp 04/02/24 0906 18     Temp 04/02/24 0906 97.9 F (36.6 C)     Temp Source 04/02/24 0906 Oral     SpO2 04/02/24 0906 94 %     Weight --      Height --      Head Circumference --      Peak Flow --      Pain Score 04/02/24 0905 7     Pain Loc --      Pain Education --      Exclude from Growth Chart --    No data found.  Updated Vital Signs BP 133/77 (BP Location: Right Arm)   Pulse 62   Temp 97.9 F (36.6 C) (Oral)   Resp 18   LMP 03/31/2024 (Exact Date)   SpO2 94%   Visual Acuity Right Eye Distance:   Left Eye Distance:   Bilateral Distance:    Right Eye Near:    Left Eye Near:    Bilateral Near:     Physical Exam Vitals and nursing note reviewed.  Constitutional:      General: She is not in acute distress.    Appearance: Normal appearance. She is well-developed.  HENT:     Head: Normocephalic and atraumatic.     Right Ear: Tympanic membrane, ear canal and external ear normal.     Left Ear: Tympanic membrane, ear canal and external ear normal.     Nose: Congestion present.     Right Turbinates: Enlarged and swollen.     Left Turbinates: Enlarged and swollen.     Right Sinus: Maxillary sinus tenderness present. No frontal sinus tenderness.     Left Sinus: Maxillary sinus tenderness present. No frontal sinus tenderness.     Mouth/Throat:     Lips: Pink.     Mouth: Mucous membranes are moist.     Pharynx: Uvula midline. Postnasal drip present. No pharyngeal swelling, oropharyngeal exudate, posterior oropharyngeal erythema or uvula swelling.     Comments: Cobblestoning present to posterior oropharynx  Eyes:     Extraocular Movements: Extraocular movements intact.     Conjunctiva/sclera: Conjunctivae normal.     Pupils: Pupils are equal, round, and reactive to light.  Neck:     Thyroid: No thyromegaly.     Trachea: No tracheal deviation.  Cardiovascular:     Rate and Rhythm: Normal rate and regular rhythm.     Pulses: Normal pulses.     Heart sounds: Normal heart sounds.  Pulmonary:     Effort: Pulmonary effort is normal. No respiratory distress.     Breath sounds: Normal breath sounds. No stridor. No wheezing, rhonchi or rales.  Abdominal:     General: Bowel sounds are normal.     Palpations: Abdomen is soft.     Tenderness: There is no abdominal tenderness.  Musculoskeletal:     Cervical back: Normal range of motion and neck supple.  Skin:    General: Skin is warm and dry.  Neurological:     General: No focal deficit present.     Mental Status: She is alert and oriented to person, place, and time.  Psychiatric:        Mood and  Affect: Mood normal.        Behavior: Behavior normal.        Thought Content: Thought content normal.        Judgment: Judgment normal.      UC Treatments / Results  Labs (all labs ordered are listed, but only abnormal results are displayed) Labs Reviewed - No data to display  EKG   Radiology No results found.  Procedures Procedures (including critical care time)  Medications Ordered in UC Medications - No data to display  Initial Impression / Assessment and Plan / UC Course  I have reviewed the triage vital signs and the nursing notes.  Pertinent labs & imaging results that were available during my care of the patient were reviewed by me and considered in my medical decision making (see chart for details).  On exam, the patient's lung sounds are clear throughout, room air sats at 94%.  She does have moderate maxillary sinus tenderness noted on exam.  Concern for bacterial sinusitis.  Will treat with Augmentin  875/125 mg tablets twice daily along with fluticasone  50 micro nasal spray.  Supportive care recommendations were provided discussed with the patient to include fluids, rest, over-the-counter analgesics, use of normal saline nasal spray, use of a humidifier during sleep.  Discussed indications with patient regarding follow-up.  Patient was in agreement with this plan of care and verbalizes understanding.  All questions were answered.  Patient stable for discharge.  Work note was provided.  Final Clinical Impressions(s) / UC Diagnoses   Final diagnoses:  Acute maxillary sinusitis, recurrence not specified     Discharge Instructions      Take medication as directed. Increase fluids and get plenty of rest. May take over-the-counter Ibuprofen  or Tylenol  as needed for pain, fever, or general discomfort. Recommend normal saline nasal spray to help with nasal congestion throughout the day. If you develop a worsening cough, recommend use of a humidifier during sleep and  sleeping elevated on pillows while symptoms persist. If your symptoms fail to improve with this treatment, you may follow-up in this clinic or with your primary care physician for further evaluation. Follow-up as needed.     ED Prescriptions     Medication Sig Dispense Auth. Provider   amoxicillin -clavulanate (AUGMENTIN ) 875-125 MG tablet Take 1 tablet by mouth every 12 (twelve) hours. 14 tablet Leath-Warren, Etta PARAS, NP   fluticasone  (FLONASE ) 50 MCG/ACT nasal spray Place 2 sprays into both nostrils daily. 16 g Leath-Warren, Etta PARAS, NP      PDMP not reviewed this encounter.   Gilmer Etta PARAS, NP 04/02/24 0930

## 2024-04-02 NOTE — Discharge Instructions (Signed)
 Take medication as directed. Increase fluids and get plenty of rest. May take over-the-counter Ibuprofen  or Tylenol  as needed for pain, fever, or general discomfort. Recommend normal saline nasal spray to help with nasal congestion throughout the day. If you develop a worsening cough, recommend use of a humidifier during sleep and sleeping elevated on pillows while symptoms persist. If your symptoms fail to improve with this treatment, you may follow-up in this clinic or with your primary care physician for further evaluation. Follow-up as needed.

## 2024-04-06 ENCOUNTER — Ambulatory Visit: Admitting: Family Medicine

## 2024-04-09 ENCOUNTER — Emergency Department (HOSPITAL_COMMUNITY)
Admission: EM | Admit: 2024-04-09 | Discharge: 2024-04-09 | Disposition: A | Discharge status: Home / Self Care | Attending: Emergency Medicine | Admitting: Emergency Medicine

## 2024-04-09 ENCOUNTER — Emergency Department (HOSPITAL_COMMUNITY)

## 2024-04-09 ENCOUNTER — Other Ambulatory Visit: Payer: Self-pay

## 2024-04-09 DIAGNOSIS — R1013 Epigastric pain: Secondary | ICD-10-CM | POA: Insufficient documentation

## 2024-04-09 DIAGNOSIS — E876 Hypokalemia: Secondary | ICD-10-CM | POA: Diagnosis not present

## 2024-04-09 DIAGNOSIS — R112 Nausea with vomiting, unspecified: Secondary | ICD-10-CM | POA: Diagnosis not present

## 2024-04-09 DIAGNOSIS — R10A1 Flank pain, right side: Secondary | ICD-10-CM | POA: Diagnosis present

## 2024-04-09 DIAGNOSIS — R1011 Right upper quadrant pain: Secondary | ICD-10-CM | POA: Insufficient documentation

## 2024-04-09 DIAGNOSIS — N200 Calculus of kidney: Secondary | ICD-10-CM

## 2024-04-09 LAB — CBC WITH DIFFERENTIAL/PLATELET
Abs Immature Granulocytes: 0.03 K/uL (ref 0.00–0.07)
Basophils Absolute: 0 K/uL (ref 0.0–0.1)
Basophils Relative: 1 %
Eosinophils Absolute: 0.2 K/uL (ref 0.0–0.5)
Eosinophils Relative: 3 %
HCT: 34.9 % — ABNORMAL LOW (ref 36.0–46.0)
Hemoglobin: 11.6 g/dL — ABNORMAL LOW (ref 12.0–15.0)
Immature Granulocytes: 0 %
Lymphocytes Relative: 32 %
Lymphs Abs: 2.4 K/uL (ref 0.7–4.0)
MCH: 25.8 pg — ABNORMAL LOW (ref 26.0–34.0)
MCHC: 33.2 g/dL (ref 30.0–36.0)
MCV: 77.7 fL — ABNORMAL LOW (ref 80.0–100.0)
Monocytes Absolute: 0.5 K/uL (ref 0.1–1.0)
Monocytes Relative: 6 %
Neutro Abs: 4.3 K/uL (ref 1.7–7.7)
Neutrophils Relative %: 58 %
Platelets: 291 K/uL (ref 150–400)
RBC: 4.49 MIL/uL (ref 3.87–5.11)
RDW: 13.7 % (ref 11.5–15.5)
WBC: 7.4 K/uL (ref 4.0–10.5)
nRBC: 0 % (ref 0.0–0.2)

## 2024-04-09 LAB — URINALYSIS, ROUTINE W REFLEX MICROSCOPIC
Bacteria, UA: NONE SEEN
Bilirubin Urine: NEGATIVE
Glucose, UA: NEGATIVE mg/dL
Ketones, ur: NEGATIVE mg/dL
Leukocytes,Ua: NEGATIVE
Nitrite: NEGATIVE
Protein, ur: NEGATIVE mg/dL
RBC / HPF: >50 RBC/hpf (ref 0–5)
Specific Gravity, Urine: 1.015 (ref 1.005–1.030)
pH: 6 (ref 5.0–8.0)

## 2024-04-09 LAB — COMPREHENSIVE METABOLIC PANEL WITH GFR
ALT: 23 U/L (ref 0–44)
AST: 24 U/L (ref 15–41)
Albumin: 4.4 g/dL (ref 3.5–5.0)
Alkaline Phosphatase: 52 U/L (ref 38–126)
Anion gap: 11 (ref 5–15)
BUN: 10 mg/dL (ref 6–20)
CO2: 24 mmol/L (ref 22–32)
Calcium: 8.9 mg/dL (ref 8.9–10.3)
Chloride: 104 mmol/L (ref 98–111)
Creatinine, Ser: 0.61 mg/dL (ref 0.44–1.00)
GFR, Estimated: >60 mL/min (ref >60)
Glucose, Bld: 101 mg/dL — ABNORMAL HIGH (ref 70–99)
Potassium: 3.2 mmol/L — ABNORMAL LOW (ref 3.5–5.1)
Sodium: 138 mmol/L (ref 135–145)
Total Bilirubin: 0.6 mg/dL (ref 0.0–1.2)
Total Protein: 7.2 g/dL (ref 6.5–8.1)

## 2024-04-09 LAB — HCG, SERUM, QUALITATIVE: Preg, Serum: NEGATIVE

## 2024-04-09 LAB — LIPASE, BLOOD: Lipase: 20 U/L (ref 11–51)

## 2024-04-09 LAB — MAGNESIUM: Magnesium: 1.9 mg/dL (ref 1.7–2.4)

## 2024-04-09 MED ORDER — HYDROMORPHONE HCL 1 MG/ML IJ SOLN
1.0000 mg | Freq: Once | INTRAMUSCULAR | Status: AC
  Administered 2024-04-09: 1 mg via INTRAVENOUS
  Filled 2024-04-09: qty 1

## 2024-04-09 MED ORDER — OXYCODONE-ACETAMINOPHEN 5-325 MG PO TABS
1.0000 | ORAL_TABLET | Freq: Once | ORAL | Status: AC
  Administered 2024-04-09: 1 via ORAL
  Filled 2024-04-09: qty 1

## 2024-04-09 MED ORDER — ONDANSETRON 4 MG PO TBDP: 4.0000 mg | ORAL_TABLET | Freq: Three times a day (TID) | ORAL | 0 refills | Status: DC | PRN

## 2024-04-09 MED ORDER — HYDROMORPHONE HCL 1 MG/ML IJ SOLN
0.5000 mg | Freq: Once | INTRAMUSCULAR | Status: AC
  Administered 2024-04-09: 0.5 mg via INTRAVENOUS
  Filled 2024-04-09: qty 0.5

## 2024-04-09 MED ORDER — NAPROXEN 500 MG PO TABS: 500.0000 mg | ORAL_TABLET | Freq: Two times a day (BID) | ORAL | 0 refills | Status: DC

## 2024-04-09 MED ORDER — POTASSIUM CHLORIDE CRYS ER 20 MEQ PO TBCR
40.0000 meq | EXTENDED_RELEASE_TABLET | Freq: Once | ORAL | Status: AC
  Administered 2024-04-09: 40 meq via ORAL
  Filled 2024-04-09: qty 2

## 2024-04-09 MED ORDER — ONDANSETRON HCL 4 MG/2ML IJ SOLN
4.0000 mg | Freq: Once | INTRAMUSCULAR | Status: AC
  Administered 2024-04-09: 4 mg via INTRAVENOUS
  Filled 2024-04-09: qty 2

## 2024-04-09 MED ORDER — KETOROLAC TROMETHAMINE 15 MG/ML IJ SOLN
15.0000 mg | Freq: Once | INTRAMUSCULAR | Status: AC
  Administered 2024-04-09: 15 mg via INTRAVENOUS
  Filled 2024-04-09: qty 1

## 2024-04-09 MED ORDER — ONDANSETRON HCL 4 MG/2ML IJ SOLN: 4.0000 mg | Freq: Once | INTRAMUSCULAR | Status: DC | PRN

## 2024-04-09 MED ORDER — SODIUM CHLORIDE 0.9 % IV BOLUS
1000.0000 mL | Freq: Once | INTRAVENOUS | Status: AC
  Administered 2024-04-09: 1000 mL via INTRAVENOUS

## 2024-04-09 MED ORDER — OXYCODONE-ACETAMINOPHEN 5-325 MG PO TABS: 1.0000 | ORAL_TABLET | Freq: Four times a day (QID) | ORAL | 0 refills | Status: DC | PRN

## 2024-04-09 MED ORDER — IOHEXOL 300 MG/ML  SOLN
100.0000 mL | Freq: Once | INTRAMUSCULAR | Status: AC | PRN
Start: 2024-04-09 — End: 2024-04-09
  Administered 2024-04-09: 100 mL via INTRAVENOUS

## 2024-04-09 NOTE — ED Triage Notes (Signed)
 Pt arrived [facility] from home with c/o right loweer abd pain that started about 30 minutes ago. Pt did vomit at home.

## 2024-04-09 NOTE — Discharge Instructions (Signed)
 Please follow-up closely with your primary care doctor and with urology on an outpatient basis.  Return to emergency department immediately for any new or worsening symptoms.

## 2024-04-09 NOTE — ED Provider Notes (Signed)
 [place] EMERGENCY DEPARTMENT AT [facility] Provider Note   CSN: [id number] Arrival date & time: 04/09/24  1505     Patient presents with: Abdominal Pain   Stacy [name] is a 38 y.o. female.   Patient is a 38 year old female who presents to the emergency department with a chief complaint of right-sided abdominal and flank pain which started approximate 30 minutes ago.  She rates the pain is severe at this time.  She has had associated nausea and vomiting.  She denies any diarrhea or constipation.  She denies any dysuria or hematuria.  She has had no recent falls or blunt abdominal wall trauma.  She has status postcholecystectomy and tubal ligation.   Abdominal Pain      Prior to Admission medications   Medication Sig Start Date End Date Taking? Authorizing Provider  acyclovir  (ZOVIRAX ) 400 MG tablet Take 1 tablet (400 mg total) by mouth 3 (three) times daily. 12/30/23   Duanne Butler DASEN, MD  amoxicillin -clavulanate (AUGMENTIN ) 875-125 MG tablet Take 1 tablet by mouth every 12 (twelve) hours. 04/02/24   Leath-Warren, Etta PARAS, NP  fluticasone  (FLONASE ) 50 MCG/ACT nasal spray Place 2 sprays into both nostrils daily. 04/02/24   Leath-Warren, Etta PARAS, NP  predniSONE  (DELTASONE ) 5 MG tablet 12 day taper: 6,6,5,5,4,4,3,3,2,2,1,1 02/03/24   Petery, John, DPM  promethazine  (PHENERGAN ) 25 MG tablet Take 1 tablet (25 mg total) by mouth every 6 (six) hours as needed for nausea or vomiting. 06/16/18 12/07/19  Idol, Julie, PA-C    Allergies: Coconut flavoring agent (non-screening) and Pineapple    Review of Systems  Gastrointestinal:  Positive for abdominal pain.  All other systems reviewed and are negative.   Updated Vital Signs BP (!) 145/88   Pulse 62   Temp 97.7 F (36.5 C)   Resp 20   Ht 5' 7 (1.702 m)   Wt 111.1 kg   LMP 03/31/2024 (Exact Date)   SpO2 100%   BMI 38.37 kg/m   Physical Exam Vitals and nursing note reviewed.  Constitutional:      General:  She is not in acute distress.    Appearance: Normal appearance. She is not ill-appearing.  HENT:     Head: Normocephalic and atraumatic.     Nose: Nose normal.     Mouth/Throat:     Mouth: Mucous membranes are moist.  Eyes:     Extraocular Movements: Extraocular movements intact.     Conjunctiva/sclera: Conjunctivae normal.     Pupils: Pupils are equal, round, and reactive to light.  Cardiovascular:     Rate and Rhythm: Normal rate and regular rhythm.     Pulses: Normal pulses.     Heart sounds: Normal heart sounds. No murmur heard.    No gallop.  Pulmonary:     Effort: Pulmonary effort is normal. No respiratory distress.     Breath sounds: Normal breath sounds. No stridor. No wheezing, rhonchi or rales.  Abdominal:     General: Abdomen is flat. Bowel sounds are normal. There is no distension.     Palpations: Abdomen is soft.     Tenderness: There is abdominal tenderness in the right upper quadrant and epigastric area. Negative signs include Murphy's sign and McBurney's sign.     Hernia: No hernia is present.  Musculoskeletal:        General: Normal range of motion.     Cervical back: Normal range of motion and neck supple.  Skin:    General: Skin is warm  and dry.  Neurological:     General: No focal deficit present.     Mental Status: She is alert and oriented to person, place, and time. Mental status is at baseline.  Psychiatric:        Mood and Affect: Mood normal.        Behavior: Behavior normal.        Thought Content: Thought content normal.        Judgment: Judgment normal.     (all labs ordered are listed, but only abnormal results are displayed) Labs Reviewed  COMPREHENSIVE METABOLIC PANEL WITH GFR  LIPASE, BLOOD  CBC WITH DIFFERENTIAL/PLATELET  URINALYSIS, ROUTINE W REFLEX MICROSCOPIC  HCG, SERUM, QUALITATIVE  POC URINE PREG, ED    EKG: None  Radiology: No results found.   Procedures   Medications Ordered in the ED  ondansetron  (ZOFRAN ) injection  4 mg (has no administration in time range)  sodium chloride  0.9 % bolus 1,000 mL (has no administration in time range)  HYDROmorphone  (DILAUDID ) injection 1 mg (has no administration in time range)  ketorolac  (TORADOL ) 15 MG/ML injection 15 mg (has no administration in time range)  ondansetron  (ZOFRAN ) injection 4 mg (has no administration in time range)                                    Medical Decision Making Amount and/or Complexity of Data Reviewed Labs: ordered. Radiology: ordered.  Risk Prescription drug management.   This patient presents to the ED for concern of abdominal pain, flank pain differential diagnosis includes acute appendicitis, cholecystitis, bowel obstruction, diverticulitis, ovarian torsion or cyst, PID, tubo-ovarian abscess, pyelonephritis, kidney stone, pancreatitis, mesenteric ischemia    Additional history obtained:  Additional history obtained from medical records External records from outside source obtained and reviewed including medical records   Lab Tests:  I Ordered, and personally interpreted labs.  The pertinent results include: No leukocytosis, anemia at baseline, mild hypokalemia, normal kidney function liver function, normal magnesium, normal lipase, urinalysis with moderate hemoglobin   Imaging Studies ordered:  Independently reviewed by myself and attending physician with suspicion for dilation of the right ureter with distal ureteral stone  Medicines ordered and prescription drug management:  I ordered medication including Dilaudid , Toradol , Zofran , potassium, Percocet, IV fluids for flank pain, hypokalemia Reevaluation of the patient after these medicines showed that the patient improved I have reviewed the patients home medicines and have made adjustments as needed   Problem List / ED Course:  Patient is doing well at this time and is stable for discharge home.  Pain has greatly improved with treatment in the emergency  department.  Discussed with patient that I do suspect that she has a distal ureteral stone at this time.  She has no acute kidney injury and no indication of urinary tract infection.  No other acute surgical process was noted on CT scan of the abdomen and pelvis.  She has no indication for choledocholithiasis or cholangitis at this point and she is already status postcholecystectomy.  There is no indication for appendicitis.  The need for close follow-up with primary care doctor and urology on an outpatient basis were discussed.  Patient voiced understanding to the plan and had no additional questions.  Strict return precautions were provided for any new or worsening symptoms.   Social Determinants of Health:  None        Final diagnoses:  None  ED Discharge Orders     None          Daralene Lonni JONETTA DEVONNA 04/09/24 1811    Cleotilde Rogue, MD 04/10/24 [phone]

## 2024-04-09 NOTE — ED Notes (Signed)
PA at bedside during triage.

## 2024-04-17 ENCOUNTER — Telehealth: Payer: Self-pay | Admitting: Podiatry

## 2024-04-17 NOTE — Telephone Encounter (Signed)
 DOS- 05/25/2024  ENDOSCOPIC PLANTAR FASCIOTOMY LT- 70106 GASTROCNEMIUS RECESS LT- 72312  [facility] EFFECTIVE DATE- 06/23/2023  DEDUCTIBLE- $5400 REMAINING- $4955.23 OOP- $8700 REMAINING- $5935.87 FAMILY DEDUCTIBLE- $10800 REMAINING- $10355.23 FAMILY OOP- $17400 REMAINING- $14635.87 COINSURANCE- 30%  PER [employer] PORTAL, PRIOR AUTH FOR CPT CODES 70106 AND [phone] HAVE BEEN APPROVED FROM 05/25/2024-08/23/2024. AUTH# [id number] PER FAX RECEIVED FROM [employer], PRIOR AUTH CANNOT BE OBTAINED UNLESS PRIMARY INSURANCE DENIES AUTH. MZQ#[id number]

## 2024-04-20 ENCOUNTER — Encounter: Payer: Self-pay | Admitting: Family Medicine

## 2024-04-20 ENCOUNTER — Ambulatory Visit: Admitting: Family Medicine

## 2024-04-20 VITALS — BP 120/70 | HR 86 | Temp 98.6°F | Ht 67.0 in | Wt 255.4 lb

## 2024-04-20 DIAGNOSIS — J029 Acute pharyngitis, unspecified: Secondary | ICD-10-CM | POA: Diagnosis not present

## 2024-04-20 MED ORDER — AMOXICILLIN 875 MG PO TABS: 875.0000 mg | ORAL_TABLET | Freq: Two times a day (BID) | ORAL | 0 refills | Status: AC

## 2024-04-20 NOTE — Progress Notes (Signed)
 Subjective:    Patient ID: Stacy [name], female    DOB: July 29, 1985, 38 y.o.   MRN: [id number]  HPI  Patient complains of 3 days of pain in her throat.  She complains of swelling on the right side of her throat and tenderness on the right side of her throat.  She has palpable tender lymphadenopathy.  On exam, she has +3 tonsil on the right and +1 tonsil on the left.  It is erythematous and swollen.  There is no evidence of any peritonsillar abscess.  She denies any trismus.  She does have tender lymphadenopathy however.  Strep test is negative Past Medical History:  Diagnosis Date   Anxiety    Carpal tunnel syndrome    Depression    Diverticulosis    Migraine    Obesity    Past Surgical History:  Procedure Laterality Date   CESAREAN SECTION     CESAREAN SECTION     CHOLECYSTECTOMY     MOUTH SURGERY     TUBAL LIGATION     WISDOM TOOTH EXTRACTION     Current Outpatient Medications on File Prior to Visit  Medication Sig Dispense Refill   acyclovir  (ZOVIRAX ) 400 MG tablet Take 1 tablet (400 mg total) by mouth 3 (three) times daily. 30 tablet 5   amoxicillin -clavulanate (AUGMENTIN ) 875-125 MG tablet Take 1 tablet by mouth every 12 (twelve) hours. 14 tablet 0   fluticasone  (FLONASE ) 50 MCG/ACT nasal spray Place 2 sprays into both nostrils daily. 16 g 0   naproxen  (NAPROSYN ) 500 MG tablet Take 1 tablet (500 mg total) by mouth 2 (two) times daily. 20 tablet 0   ondansetron  (ZOFRAN -ODT) 4 MG disintegrating tablet Take 1 tablet (4 mg total) by mouth every 8 (eight) hours as needed for nausea or vomiting. 20 tablet 0   oxyCODONE -acetaminophen  (PERCOCET/ROXICET) 5-325 MG tablet Take 1 tablet by mouth every 6 (six) hours as needed for severe pain (pain score 7-10). 10 tablet 0   predniSONE  (DELTASONE ) 5 MG tablet 12 day taper: 6,6,5,5,4,4,3,3,2,2,1,1 42 tablet 0   [DISCONTINUED] promethazine  (PHENERGAN ) 25 MG tablet Take 1 tablet (25 mg total) by mouth every 6 (six) hours as needed for nausea  or vomiting. 30 tablet 0   No current facility-administered medications on file prior to visit.   Allergies  Allergen Reactions   Coconut Flavoring Agent (Non-Screening) Other (See Comments)    Mouth/throat lesions    Pineapple Other (See Comments)    Mouth/throat leasions   Social History   Socioeconomic History   Marital status: Single    Spouse name: Not on file   Number of children: 2   Years of education: 10th    Highest education level: 11th grade  Occupational History   Occupation: Solicitor member at [employer]  Tobacco Use   Smoking status: Former    Current packs/day: 0.00    Average packs/day: (0.1 ttl pk-yrs)    Types: Cigarettes    Start date: 05/22/2009    Quit date: 11/21/2010    Years since quitting: 13.4   Smokeless tobacco: Never  Vaping Use   Vaping status: Never Used  Substance and Sexual Activity   Alcohol use: No    Alcohol/week: 0.0 standard drinks of alcohol   Drug use: No   Sexual activity: Yes    Birth control/protection: Surgical  Other Topics Concern   Not on file  Social History Narrative   Not on file   Social Drivers of Corporate Investment Banker  Strain: Low Risk  (04/04/2024)   Overall Financial Resource Strain (CARDIA)    Difficulty of Paying Living Expenses: Not hard at all  Food Insecurity: No Food Insecurity (04/04/2024)   Hunger Vital Sign    Worried About Running Out of Food in the Last Year: Never true    Ran Out of Food in the Last Year: Never true  Transportation Needs: No Transportation Needs (04/04/2024)   PRAPARE - Administrator, Civil Service (Medical): No    Lack of Transportation (Non-Medical): No  Physical Activity: Unknown (04/04/2024)   Exercise Vital Sign    Days of Exercise per Week: Patient declined    Minutes of Exercise per Session: Not on file  Stress: Patient Declined (04/04/2024)   [employer] of Occupational Health - Occupational Stress Questionnaire    Feeling of Stress: Patient  declined  Social Connections: Moderately Isolated (04/04/2024)   Social Connection and Isolation Panel    Frequency of Communication with Friends and Family: More than three times a week    Frequency of Social Gatherings with Friends and Family: More than three times a week    Attends Religious Services: Patient declined    Database Administrator or Organizations: No    Attends Engineer, Structural: Not on file    Marital Status: Living with partner  Intimate Partner Violence: Not on file     Review of Systems  All other systems reviewed and are negative.      Objective:   Physical Exam Constitutional:      Appearance: Normal appearance. She is obese. She is not ill-appearing, toxic-appearing or diaphoretic.  HENT:     Right Ear: Tympanic membrane and ear canal normal.     Left Ear: Tympanic membrane normal.     Nose: Nose normal. No congestion or rhinorrhea.     Mouth/Throat:     Pharynx: Posterior oropharyngeal erythema present.     Tonsils: No tonsillar exudate or tonsillar abscesses. 3+ on the right. 1+ on the left.   Cardiovascular:     Rate and Rhythm: Normal rate and regular rhythm.     Pulses: Normal pulses.     Heart sounds: Normal heart sounds.  Pulmonary:     Effort: Pulmonary effort is normal.     Breath sounds: Normal breath sounds.  Abdominal:     General: Bowel sounds are normal. There is no distension.     Palpations: Abdomen is soft. There is no mass.     Tenderness: There is no abdominal tenderness. There is no guarding or rebound.  Lymphadenopathy:     Cervical: Cervical adenopathy present.  Neurological:     Mental Status: She is alert.           Assessment & Plan:  Sore throat - Plan: STREP GROUP A AG, W/REFLEX TO CULT Strep test is negative.  Clinically the patient has tonsillitis.  She denies any rhinorrhea or cough or other symptoms to support a viral infection.  She does have having subjective fevers at night.  I will treat the  patient empirically with amoxicillin  875 mg twice daily for 10 days

## 2024-04-22 LAB — STREP GROUP A AG, W/REFLEX TO CULT: Streptococcus Group A AG: NOT DETECTED

## 2024-04-22 LAB — CULTURE, GROUP A STREP
Micro Number: 17169362
SPECIMEN QUALITY:: ADEQUATE

## 2024-05-25 ENCOUNTER — Other Ambulatory Visit: Payer: Self-pay | Admitting: Podiatry

## 2024-05-25 DIAGNOSIS — M722 Plantar fascial fibromatosis: Secondary | ICD-10-CM | POA: Diagnosis not present

## 2024-05-25 DIAGNOSIS — M62462 Contracture of muscle, left lower leg: Secondary | ICD-10-CM | POA: Diagnosis not present

## 2024-05-25 MED ORDER — OXYCODONE-ACETAMINOPHEN 5-325 MG PO TABS: 1.0000 | ORAL_TABLET | ORAL | 0 refills | Status: DC | PRN

## 2024-05-25 MED ORDER — IBUPROFEN 800 MG PO TABS: 800.0000 mg | ORAL_TABLET | Freq: Three times a day (TID) | ORAL | 1 refills | Status: DC

## 2024-05-25 NOTE — Progress Notes (Signed)
 PRN postop

## 2024-05-29 ENCOUNTER — Telehealth: Payer: Self-pay | Admitting: Podiatry

## 2024-05-29 DIAGNOSIS — Z0279 Encounter for issue of other medical certificate: Secondary | ICD-10-CM

## 2024-05-29 NOTE — Telephone Encounter (Signed)
 lft mess on vmail for pt the 25 has to be paid prior to completion of forms and said she can have someone pay for her and then I can fill them out and fax for her

## 2024-05-30 ENCOUNTER — Telehealth: Payer: Self-pay | Admitting: Podiatry

## 2024-05-30 NOTE — Telephone Encounter (Signed)
 Stacy [name] [phone] and left copy for the patient for her records to be picked up at visit tomorrow. RTW 07/26/2024 approx DOS 05/25/24

## 2024-05-31 ENCOUNTER — Ambulatory Visit (INDEPENDENT_AMBULATORY_CARE_PROVIDER_SITE_OTHER): Admitting: Podiatry

## 2024-05-31 ENCOUNTER — Telehealth: Payer: Self-pay | Admitting: Podiatry

## 2024-05-31 VITALS — BP 140/89 | HR 67 | Temp 98.1°F

## 2024-05-31 DIAGNOSIS — Z0279 Encounter for issue of other medical certificate: Secondary | ICD-10-CM

## 2024-05-31 DIAGNOSIS — M62462 Contracture of muscle, left lower leg: Secondary | ICD-10-CM

## 2024-05-31 DIAGNOSIS — M722 Plantar fascial fibromatosis: Secondary | ICD-10-CM

## 2024-05-31 DIAGNOSIS — Z9889 Other specified postprocedural states: Secondary | ICD-10-CM

## 2024-05-31 NOTE — Telephone Encounter (Signed)
 lft mess on vmail for pt to adv her forms are ready for pick up when she comes in today and that I faxed them for her as well

## 2024-05-31 NOTE — Progress Notes (Signed)
° °  Chief Complaint  Patient presents with   Post-op Follow-up    POV # 1 DOS 05/25/24 LT EPF, LT GASTROCNEMIUS RECESSION  Doing okay, the pain does not hit until nighttime. Saturday and Sunday was pretty bad. Using a wheelchair to get around. Not changed the dressing since surgery. Lots of pain in the bottom of the heel when I try to walk.    Subjective:  Patient presents today status post gastroc aponeurosis lengthening with EPF left.  DOS: 05/25/2024.  Doing well.  No new complaints  Past Medical History:  Diagnosis Date   Anxiety    Carpal tunnel syndrome    Depression    Diverticulosis    Migraine    Obesity     Past Surgical History:  Procedure Laterality Date   CESAREAN SECTION     CESAREAN SECTION     CHOLECYSTECTOMY     MOUTH SURGERY     TUBAL LIGATION     WISDOM TOOTH EXTRACTION      Allergies  Allergen Reactions   Coconut Flavoring Agent (Non-Screening) Other (See Comments)    Mouth/throat lesions    Pineapple Other (See Comments)    Mouth/throat leasions    Objective/Physical Exam Neurovascular status intact.  Incision well coapted with sutures and staples intact. No sign of infectious process noted. No dehiscence. No active bleeding noted.  Calf is soft and supple.  No pain with compression.  Overall well-healing surgical foot   Assessment: 1. s/p EPF + gastroc aponeurosis lengthening LT. DOS: 05/25/2024   Plan of Care:  -Patient was evaluated.  -Dressings changed. -Okay to wash and shower and get the foot wet -Recommend Band-Aid over the incision sites as well as knee-high compression socks.  She will see if she can purchase them at [facility] in [place] today -Return to clinic 1 week suture and staple removal  Thresa EMERSON Sar, DPM [facility]  Dr. Thresa EMERSON Sar, DPM    [address] [place], [place] [address]                Office [phone]  Fax [phone]

## 2024-05-31 NOTE — Progress Notes (Signed)
 Patient presents for post-op visit today, POV # 1 DOS 05/25/24 LT EPF, LT GASTROCNEMIUS RECESSION  Doing okay, the pain does not hit until nighttime. Saturday and Sunday was pretty bad. Using a wheelchair to get around. Not changed the dressing since surgery. Lots of pain in the bottom of the heel when I try to walk..  RN Notes: n/a  Vital Signs: Today's Vitals   05/31/24 1545  BP: (!) 140/89  Pulse: 67  Temp: 98.1 F (36.7 C)  TempSrc: Oral  PainSc: 5   PainLoc: Foot      Radiographs: []  Taken [x]  Not taken  Surgical Site Assessment:  - Dressing:  [x]  Minimal dry blood, intact []  Reinforced   [x]  Changed     -RN Notes: n/a  - Incision:  [x]  CDI (clean, dry, intact)  [x]  Mild erythema  []  Drainage noted   -RN Notes: medial incision has minimal amount of maceration  - Swelling:  []  None  [x]  Mild  []  Moderate   []  Significant     -RN Notes: n/a  - Bruising:  []  None  [x]  Present: arch, bilateral aspect heel   - Sutures/Staples:  []  None [x]  Intact  []  Removed Today  [x]  Plan to remove at next visit   -Cast/Splint/Pins: [x]  None []  Intact []  Removed Today []  Plan to remove at next visit []  Replaced  -Signs of infection:  [x]  None  []  Present - Describe: n/a  -DME:    []  None [x]  Tall AFW []  Surgical shoe []  Cast  []  Splint  -Walking status:  [x]  Full WB  []  Partial WB  []  NWB  -Utilizing device:  [x]  None []  Knee Scooter []  Crutches []  Wheelchair    DVT assessment:  [x]  Denies symptoms []  Chest pain/SOB []  Pain in calf/redness/warmth   Redressed DSD and ace wrap. Educated on signs of infection, proper dressing care, pain management, and weight bearing status. Patient will contact provider with any new or worsening symptoms. The provider assessed the patient today and reviewed instructions regarding plan of care.

## 2024-06-05 ENCOUNTER — Telehealth: Payer: Self-pay | Admitting: Podiatry

## 2024-06-05 NOTE — Telephone Encounter (Signed)
 cld and lft mess on vmail that forms are ready for her to pick up at [facility] or she can call me bk with a fax# or I can fax to her all she has to do is let me know.

## 2024-06-06 NOTE — Telephone Encounter (Signed)
 Thank you for the update!

## 2024-06-06 NOTE — Telephone Encounter (Signed)
 Refaxed Lin Fin corrective form to [phone] 2C Yes to treatment visits(office visits) at least twice per year.

## 2024-06-07 ENCOUNTER — Ambulatory Visit

## 2024-06-07 DIAGNOSIS — M722 Plantar fascial fibromatosis: Secondary | ICD-10-CM

## 2024-06-07 DIAGNOSIS — M62462 Contracture of muscle, left lower leg: Secondary | ICD-10-CM

## 2024-06-07 NOTE — Progress Notes (Signed)
 Patient presents for post-op visit today, POV # 2 DOS 05/25/24 LT EPF, LT GASTROCNEMIUS RECESSION  FMLA paperwork needs to be updated because it was marked that she was not getting treatment. She has it with her today to be updated. Nighttime is the worst, likely because I am up moving around. Really swollen. I have been cleaning it and using neosporin..  RN Notes: Patient reports tenderness to touch on the medial incision, denies drainage.   Vital Signs: Today's Vitals   06/07/24 1531  PainSc: 4   PainLoc: Foot      Radiographs: []  Taken [x]  Not taken  Surgical Site Assessment:  - Dressing:  [x]  No blood, intact []  Reinforced   []  Changed     -RN Notes: n/a  - Incision:  [x]  CDI (clean, dry, intact)  []  Mild erythema  []  Drainage noted   -RN Notes: n/a  - Swelling:  []  None  [x]  Mild  []  Moderate   []  Significant     -RN Notes: n/a  - Bruising:  [x]  None  []  Present: n/a   - Sutures/Staples:  []  None [x]  Intact  []  Removed Today  [x]  Plan to remove at next visit   -Cast/Splint/Pins: [x]  None []  Intact []  Removed Today []  Plan to remove at next visit []  Replaced  -Signs of infection:  [x]  None  []  Present - Describe: n/a  -DME:    []  None [x]  Tall AFW []  Surgical shoe []  Cast  []  Splint  -Walking status:  [x]  Full WB  []  Partial WB  []  NWB  -Utilizing device:  [x]  None []  Knee Scooter []  Crutches []  Wheelchair    DVT assessment:  [x]  Denies symptoms []  Chest pain/SOB []  Pain in calf/redness/warmth   Redressed DSD and ace wrap. Educated on signs of infection, proper dressing care, pain management, and weight bearing status. Patient will contact provider with any new or worsening symptoms. The provider assessed the patient today and reviewed instructions regarding plan of care.

## 2024-06-21 ENCOUNTER — Encounter: Admitting: Podiatry

## 2024-06-26 ENCOUNTER — Other Ambulatory Visit (HOSPITAL_COMMUNITY): Payer: Self-pay

## 2024-06-26 ENCOUNTER — Encounter: Payer: Self-pay | Admitting: Podiatry

## 2024-06-26 ENCOUNTER — Ambulatory Visit (INDEPENDENT_AMBULATORY_CARE_PROVIDER_SITE_OTHER)

## 2024-06-26 ENCOUNTER — Encounter: Payer: Self-pay | Admitting: Student-PharmD

## 2024-06-26 ENCOUNTER — Telehealth: Payer: Self-pay

## 2024-06-26 ENCOUNTER — Encounter: Admitting: Podiatry

## 2024-06-26 ENCOUNTER — Ambulatory Visit (HOSPITAL_COMMUNITY)
Admission: RE | Admit: 2024-06-26 | Discharge: 2024-06-26 | Disposition: A | Discharge status: Home / Self Care | Source: Ambulatory Visit | Attending: Podiatry | Admitting: Podiatry

## 2024-06-26 VITALS — HR 62

## 2024-06-26 VITALS — Ht 67.0 in | Wt 255.0 lb

## 2024-06-26 DIAGNOSIS — I82442 Acute embolism and thrombosis of left tibial vein: Secondary | ICD-10-CM | POA: Diagnosis present

## 2024-06-26 DIAGNOSIS — M62462 Contracture of muscle, left lower leg: Secondary | ICD-10-CM | POA: Diagnosis not present

## 2024-06-26 MED ORDER — APIXABAN (ELIQUIS) VTE STARTER PACK (10MG AND 5MG)
ORAL_TABLET | ORAL | 0 refills | Status: AC
  Filled 2024-06-26: qty 74, 30d supply, fill #0

## 2024-06-26 MED ORDER — APIXABAN 5 MG PO TABS
5.0000 mg | ORAL_TABLET | Freq: Two times a day (BID) | ORAL | 0 refills | Status: AC
  Filled 2024-06-26: qty 120, 60d supply, fill #0

## 2024-06-26 NOTE — Telephone Encounter (Signed)
 Patient Advocate Encounter  Test billing for this patient's current coverage ([employer]) returns a $0 copay for 90 day supply of Eliquis .  This test claim was processed through [employer]- copay amounts may vary at other pharmacies due to [employer], or as the patient moves through the different stages of their insurance plan.  Rachel DEL, CPhT Rx Patient Advocate Phone: [phone]

## 2024-06-26 NOTE — Patient Instructions (Signed)
-  Start apixaban  (Eliquis ) 10 mg twice daily for 7 days followed by 5 mg twice daily. -It is important to take your medication around the same time every day.  -Avoid NSAIDs like ibuprofen (Advil, Motrin) and naproxen (Aleve) as well as aspirin  doses over 100 mg daily. -Tylenol  (acetaminophen ) is the preferred over the counter pain medication to lower the risk of bleeding. -Be sure to alert all of your health care providers that you are taking an anticoagulant prior to starting a new medication or having a procedure. -Monitor for signs and symptoms of bleeding (abnormal bruising, prolonged bleeding, nose bleeds, bleeding from gums, discolored urine, black tarry stools). If you have fallen and hit your head OR if your bleeding is severe or not stopping, seek emergency care.  -Go to the emergency room if emergent signs and symptoms of new clot occur (new or worse swelling and pain in an arm or leg, shortness of breath, chest pain, fast or irregular heartbeats, lightheadedness, dizziness, fainting, coughing up blood) or if you experience a significant color change (pale or blue) in the extremity that has the DVT.  -We recommend you wear compression stockings (20-30 mmHg) as long as you are having swelling or pain. Be sure to purchase the correct size and take them off at night.   If you have any questions or need to reschedule an appointment, please call [phone]. If you are having an emergency, call 911 or present to the nearest emergency room.   What is a DVT?  -Deep vein thrombosis (DVT) is a condition in which a blood clot forms in a vein of the deep venous system which can occur in the lower leg, thigh, pelvis, arm, or neck. This condition is serious and can be life-threatening if the clot travels to the arteries of the lungs and causing a blockage (pulmonary embolism, PE). A DVT can also damage veins in the leg, which can lead to long-term venous disease, leg pain, swelling, discoloration, and  ulcers or sores (post-thrombotic syndrome).  -Treatment may include taking an anticoagulant medication to prevent more clots from forming and the current clot from growing, wearing compression stockings, and/or surgical procedures to remove or dissolve the clot.

## 2024-06-26 NOTE — Progress Notes (Signed)
" ° °  Chief Complaint  Patient presents with   Foot Pain    Patient is here for F/U Gastrocnemius equinus, left Patient fell on new years and felt tear in foot now having constant heel pain again    Subjective:  Patient presents today status post gastroc aponeurosis lengthening with EPF left.  DOS: 05/25/2024.  Patient states that on New Year's Eve she fell and injured her leg.  She has noticed increased swelling with increased pain as well.  She was in the cam boot at the time of the incident.  Past Medical History:  Diagnosis Date   Anxiety    Carpal tunnel syndrome    Depression    Diverticulosis    Migraine    Obesity     Past Surgical History:  Procedure Laterality Date   CESAREAN SECTION     CESAREAN SECTION     CHOLECYSTECTOMY     MOUTH SURGERY     TUBAL LIGATION     WISDOM TOOTH EXTRACTION      Allergies  Allergen Reactions   Coconut Flavoring Agent (Non-Screening) Other (See Comments)    Mouth/throat lesions    Pineapple Other (See Comments)    Mouth/throat leasions    Objective/Physical Exam Neurovascular status intact.  Incisions are nicely healed.  There is some increased edema noted to the lower extremity today.  There is also calf tenderness with compression of the gastroc muscle belly.  Concerning for possible DVT  Radiographic exam LT foot 06/26/2024 Normal osseous mineralization.  Joint spaces preserved.  No acute fracture.  Impression: Negative  Assessment: 1. s/p EPF + gastroc aponeurosis lengthening LT. DOS: 05/25/2024 2.  Calf tenderness with left lower extremity edema concerning for possible DVT  Plan of Care:  -Patient was evaluated.  X-rays reviewed - Due to the increased edema and swelling to the foot with calf tenderness I did order a stat venous Doppler to rule out DVT -Order also placed for physical therapy at home outpatient rehab -Continue WBAT cam boot.  Okay to transition out of the cam boot into good supportive tennis shoes and sneakers  begin 07/10/2024 -Return to clinic 4 weeks  Thresa EMERSON Sar, DPM [facility]  Dr. Thresa EMERSON Sar, DPM    [address] [place], [place] [address]                Office [phone]  Fax [phone]      "

## 2024-06-26 NOTE — Progress Notes (Addendum)
 " DVT Clinic Note  Name: Stacy [name]     MRN: [id number]     DOB: [birth year]     Sex: female  PCP: Duanne Butler DASEN, MD  Today's Visit: Visit Information: Initial Visit  Referred to DVT Clinic by: Podiatry - Dr. Janit Referred to CPP by: Dr. Serene Reason for referral:  Chief Complaint  Patient presents with   DVT   HISTORY OF PRESENT ILLNESS: Stacy [name] is a 39 y.o. female who presents after diagnosis of DVT for medication management. Patient is s/p gastrocnemius aponeurosis lengthening with EPF left on 05/25/24 and has been in walking boot since. She had a fall on 06/21/24. She initially had swelling after the surgery, but had worsening swelling and calf pain after the fall. She was seen by podiatry today who were concerned for possible DVT. Ultrasound today showed acute left posterior tibial vein and was referred to DVT clinic to start treatment. Denies personal and family hx of DVT. S/p tubal ligation, last menstrual period 05/26/24. She has been elevating her leg and wearing compression since the surgery. Denies chest pain, SOB.   Positive Thrombotic Risk Factors: Recent surgery (within 3 months), Recent trauma (within 3 months), Paralysis, paresis, or recent plaster cast immobilization of lower extremity, Obesity Bleeding Risk Factors: None Present  Negative Thrombotic Risk Factors: Previous VTE, Recent admission to hospital with acute illness (within 3 months), Central venous catheterization, Bed rest >72 hours within 3 months, Sedentary journey lasting >8 hours within 4 weeks, Pregnancy, Within 6 weeks postpartum, Recent cesarean section (within 3 months), Estrogen therapy, Testosterone therapy, Erythropoiesis-stimulating agent, Recent COVID diagnosis (within 3 months), Known thrombophilic condition, Smoking, Older age, Active cancer, Non-malignant, chronic inflammatory condition  Rx Insurance Coverage: Commercial Medicaid Rx Affordability: Eliquis  is $0 for a 90 day supply Rx  Assistance Provided: None needed at this time Preferred Pharmacy: 3 month supply filled at [facility]  Past Medical History:  Diagnosis Date   Anxiety    Carpal tunnel syndrome    Depression    Diverticulosis    Migraine    Obesity     Past Surgical History:  Procedure Laterality Date   CESAREAN SECTION     CESAREAN SECTION     CHOLECYSTECTOMY     MOUTH SURGERY     TUBAL LIGATION     WISDOM TOOTH EXTRACTION      Social History   Socioeconomic History   Marital status: Single    Spouse name: Not on file   Number of children: 2   Years of education: 10th    Highest education level: 11th grade  Occupational History   Occupation: Solicitor member at [employer]  Tobacco Use   Smoking status: Former    Current packs/day: 0.00    Average packs/day: (0.1 ttl pk-yrs)    Types: Cigarettes    Start date: 05/22/2009    Quit date: 11/21/2010    Years since quitting: 13.6   Smokeless tobacco: Never  Vaping Use   Vaping status: Never Used  Substance and Sexual Activity   Alcohol use: No    Alcohol/week: 0.0 standard drinks of alcohol   Drug use: No   Sexual activity: Yes    Birth control/protection: Surgical  Other Topics Concern   Not on file  Social History Narrative   Not on file   Social Drivers of Health   Tobacco Use: Medium Risk (06/26/2024)   Patient History    Smoking Tobacco Use: Former    Smokeless  Tobacco Use: Never    Passive Exposure: Not on file  Financial Resource Strain: Low Risk (04/04/2024)   Overall Financial Resource Strain (CARDIA)    Difficulty of Paying Living Expenses: Not hard at all  Food Insecurity: No Food Insecurity (04/04/2024)   Epic    Worried About Programme Researcher, Broadcasting/film/video in the Last Year: Never true    Ran Out of Food in the Last Year: Never true  Transportation Needs: No Transportation Needs (04/04/2024)   Epic    Lack of Transportation (Medical): No    Lack of Transportation (Non-Medical): No  Physical Activity: Unknown (04/04/2024)    Exercise Vital Sign    Days of Exercise per Week: Patient declined    Minutes of Exercise per Session: Not on file  Stress: Patient Declined (04/04/2024)   [employer] of Occupational Health - Occupational Stress Questionnaire    Feeling of Stress: Patient declined  Social Connections: Moderately Isolated (04/04/2024)   Social Connection and Isolation Panel    Frequency of Communication with Friends and Family: More than three times a week    Frequency of Social Gatherings with Friends and Family: More than three times a week    Attends Religious Services: Patient declined    Active Member of Clubs or Organizations: No    Attends Engineer, Structural: Not on file    Marital Status: Living with partner  Intimate Partner Violence: Not on file  Depression (PHQ2-9): Medium Risk (01/05/2023)   Depression (PHQ2-9)    PHQ-2 Score: 9  Alcohol Screen: Not on file  Housing: Low Risk (04/04/2024)   Epic    Unable to Pay for Housing in the Last Year: No    Number of Times Moved in the Last Year: 0    Homeless in the Last Year: No  Utilities: Not on file  Health Literacy: Not on file    Family History  Problem Relation Age of Onset   Diabetes Mother    Hypertension Mother    Kidney disease Mother    Stroke Mother    Hypertension Father     Allergies as of 06/26/2024 - Review Complete 06/26/2024  Allergen Reaction Noted   Coconut flavoring agent (non-screening) Other (See Comments) 05/25/2012   Pineapple Other (See Comments) 05/25/2012    Medications Ordered Prior to Encounter[1] REVIEW OF SYSTEMS:  Review of Systems  Respiratory:  Negative for shortness of breath.   Cardiovascular:  Positive for leg swelling. Negative for chest pain and palpitations.  Musculoskeletal:  Positive for myalgias.  Neurological:  Negative for dizziness and tingling.   PHYSICAL EXAMINATION:  Vitals:   06/26/24 1326  Pulse: 62  SpO2: 98%    Physical Exam Vitals reviewed.   Cardiovascular:     Rate and Rhythm: Normal rate.  Pulmonary:     Effort: Pulmonary effort is normal.  Musculoskeletal:     Right lower leg: Edema (Trace) present.     Left lower leg: Edema (2+) present.    Villalta Score for Post-Thrombotic Syndrome: Pain: Moderate Cramps: Absent Heaviness: Mild Paresthesia: Absent Pruritus: Absent Pretibial Edema: Moderate Skin Induration: Absent Hyperpigmentation: Absent Redness: Absent Venous Ectasia: Absent Pain on calf compression: Mild Villalta Preliminary Score: 6 Is venous ulcer present?: No If venous ulcer is present and score is <15, then 15 points total are assigned: Absent Villalta Total Score: 6  LABS:  CBC     Component Value Date/Time   WBC 7.4 04/09/2024 1540   RBC 4.49 04/09/2024 1540  HGB 11.6 (L) 04/09/2024 1540   HCT 34.9 (L) 04/09/2024 1540   PLT 291 04/09/2024 1540   MCV 77.7 (L) 04/09/2024 1540   MCH 25.8 (L) 04/09/2024 1540   MCHC 33.2 04/09/2024 1540   RDW 13.7 04/09/2024 1540   LYMPHSABS 2.4 04/09/2024 1540   MONOABS 0.5 04/09/2024 1540   EOSABS 0.2 04/09/2024 1540   BASOSABS 0.0 04/09/2024 1540    Hepatic Function      Component Value Date/Time   PROT 7.2 04/09/2024 1540   ALBUMIN 4.4 04/09/2024 1540   AST 24 04/09/2024 1540   ALT 23 04/09/2024 1540   ALKPHOS 52 04/09/2024 1540   BILITOT 0.6 04/09/2024 1540    Renal Function   Lab Results  Component Value Date   CREATININE 0.61 04/09/2024   CREATININE 0.60 04/06/2023   CREATININE 0.66 04/06/2023    CrCl cannot be calculated (Patient's most recent lab result is older than the maximum 21 days allowed.).   VVS Vascular Lab Studies:  06/26/24: VAS US  LOWER EXTREMITY VENOUS LEFT (DVT) Summary:  RIGHT:  - No evidence of common femoral vein obstruction.    LEFT:  - Findings consistent with acute deep vein thrombosis involving one of the  left posterior tibial veins.   - No cystic structure found in the popliteal fossa.    ASSESSMENT: Location of DVT: Left distal vein Cause of DVT: provoked by a transient risk factor  Patient without prior history of DVT diagnosed with acute left posterior tibial DVT. DVT provoked by recent surgery on 05/25/24, immobilization of LLE in a boot, and recent fall, which occurred immediately before DVT symptom onset. Will start anticoagulation with Eliquis . Will plan to treat first provoked DVT for a total of 3 months pending resolution of risk factors. No barriers to medication access or adherence identified today. Patient is s/p tubal ligation and still menstruates. Discussed risk of worsening menstrual bleeding while on Eliquis . Extensively counseled on Eliquis  and all questions have been answered. Will follow up in DVT clinic in 3 months for likely end of treatment visit. No need for follow up imaging.   PLAN: -Start apixaban  (Eliquis ) 10 mg twice daily for 7 days followed by 5 mg twice daily. -Expected duration of therapy: 3 months. Therapy started on 06/26/24. -Patient educated on purpose, proper use and potential adverse effects of apixaban  (Eliquis ). -Discussed importance of taking medication around the same time every day. -Advised patient of medications to avoid (NSAIDs, aspirin  doses >100 mg daily). -Educated that Tylenol  (acetaminophen ) is the preferred analgesic to lower the risk of bleeding. -Advised patient to alert all providers of anticoagulation therapy prior to starting a new medication or having a procedure. -Emphasized importance of monitoring for signs and symptoms of bleeding (abnormal bruising, prolonged bleeding, nose bleeds, bleeding from gums, discolored urine, black tarry stools). -Educated patient to present to the ED if emergent signs and symptoms of new thrombosis occur. -Counseled patient to wear compression stockings daily, removing at night.  Follow up:  DVT Clinic in 3 months  Jenkins Graces, PharmD PGY1 Pharmacy Resident  Lum Herald, PharmD, BCACP,  CPP [facility] [phone]    I have evaluated the patient's chart/imaging and refer this patient to the Clinical Pharmacist Practitioner for medication management. I have reviewed the CPP's documentation and agree with her assessment and plan. I was immediately available during the visit for questions and collaboration.   Malvina New, MD     [1]  Current Outpatient Medications  on File Prior to Visit  Medication Sig Dispense Refill   oxycodone -acetaminophen  (LYNOX) 5-300 MG tablet Take 1 tablet by mouth every 4 (four) hours as needed for pain.     No current facility-administered medications on file prior to visit.   "

## 2024-06-27 ENCOUNTER — Encounter

## 2024-07-10 ENCOUNTER — Ambulatory Visit (HOSPITAL_COMMUNITY): Attending: Podiatry

## 2024-07-10 ENCOUNTER — Encounter (HOSPITAL_COMMUNITY): Payer: Self-pay

## 2024-07-10 DIAGNOSIS — M79662 Pain in left lower leg: Secondary | ICD-10-CM | POA: Diagnosis present

## 2024-07-10 DIAGNOSIS — M62462 Contracture of muscle, left lower leg: Secondary | ICD-10-CM | POA: Insufficient documentation

## 2024-07-10 DIAGNOSIS — M25672 Stiffness of left ankle, not elsewhere classified: Secondary | ICD-10-CM | POA: Diagnosis present

## 2024-07-10 NOTE — Therapy (Signed)
 " OUTPATIENT PHYSICAL THERAPY LOWER EXTREMITY EVALUATION   Patient Name: Stacy [name] MRN: [id number] DOB:1985/07/30, 39 y.o., female Today's Date: 07/10/2024  END OF SESSION:  PT End of Session - 07/10/24 1115     Visit Number 1    Number of Visits 12    Date for Recertification  09/15/24    Authorization Type [employer] with [employer] Secondary    Authorization Time Period seeking authorization    PT Start Time 1115    PT Stop Time 1157    PT Time Calculation (min) 42 min    Activity Tolerance Patient tolerated treatment well    Behavior During Therapy WFL for tasks assessed/performed          Past Medical History:  Diagnosis Date   Anxiety    Carpal tunnel syndrome    Depression    Diverticulosis    Migraine    Obesity    Past Surgical History:  Procedure Laterality Date   CESAREAN SECTION     CESAREAN SECTION     CHOLECYSTECTOMY     MOUTH SURGERY     TUBAL LIGATION     WISDOM TOOTH EXTRACTION     Patient Active Problem List   Diagnosis Date Noted   Pain of left hand 09/15/2022   Acute postoperative pain 07/17/2022   Bilateral carpal tunnel syndrome 06/23/2022   HSV-2 (herpes simplex virus 2) infection 08/07/2015   Precordial pain 05/31/2013   Obesity    Carpal tunnel syndrome     PCP: Duanne Butler DASEN, MD   REFERRING PROVIDER: Janit Thresa HERO, DPM   REFERRING DIAG: Gastrocnemius equinus, left   THERAPY DIAG:  Pain in left lower leg  Stiffness of left ankle, not elsewhere classified  Rationale for Evaluation and Treatment: Rehabilitation  ONSET DATE: 05/25/24  SUBJECTIVE:   SUBJECTIVE STATEMENT: Patient reports that she had surgery on 05/25/24. She notes that it has been really hurting since surgery, but it got worse after falling around New Year's Eve. She was going down stairs when she stepped on her step wrong which caused her to fall. She was wearing a boot, but she was told to stop wearing the boot due to finding a blood clot in  her left calf. She is now on blood thinners for 3 months for the blood clot.    PERTINENT HISTORY: Obesity and allergies PAIN:  Are you having pain? Yes: NPRS scale: 8/10; worst: 10+/10 Pain location: left calf Pain description: sharp Aggravating factors: dorsiflexion  Relieving factors: none known   PRECAUTIONS: Fall  RED FLAGS: None   WEIGHT BEARING RESTRICTIONS: No  FALLS:  Has patient fallen in last 6 months? Yes. Number of falls 1  LIVING ENVIRONMENT: Lives with: lives with their family Lives in: House/apartment Stairs: Yes: External: 6 steps; has a railing in the middle of her steps Has following equipment at home: None  OCCUPATION: international aid/development worker at [employer]  PLOF: Independent  PATIENT GOALS: reduced pain and be able to walk without limping  NEXT MD VISIT: 07/24/24  OBJECTIVE:  Note: Objective measures were completed at Evaluation unless otherwise noted.  PATIENT SURVEYS:  LEFS  Extreme difficulty/unable (0), Quite a bit of difficulty (1), Moderate difficulty (2), Little difficulty (3), No difficulty (4) Survey date:  07/10/24  Any of your usual work, housework or school activities 2  2. Usual hobbies, recreational or sporting activities 0  3. Getting into/out of the bath 4  4. Walking between rooms 3  5. Putting on socks/shoes 4  6. Squatting  1  7. Lifting an object, like a bag of groceries from the floor 0  8. Performing light activities around your home 4  9. Performing heavy activities around your home 0  10. Getting into/out of a car 4  11. Walking 2 blocks 0  12. Walking 1 mile 0  13. Going up/down 10 stairs (1 flight) 0  14. Standing for 1 hour 0  15.  sitting for 1 hour 0  16. Running on even ground 0  17. Running on uneven ground 0  18. Making sharp turns while running fast 0  19. Hopping  0  20. Rolling over in bed 4  Score total:  26/80     COGNITION: Overall cognitive status: Within functional limits for tasks  assessed     SENSATION: Patient reports no numbness or tingling  EDEMA:  Figure 8: L: 58.1 cm  R: 56.0 cm   PALPATION: TTP: left achilles tendon  JOINT MOBILITY:   Left talocrural: hypomobile and painful   Left subtalar: hypomobile and pressure   Left midfoot: hypomobile and tingling   Left forefoot: WFL and nonpainful   LOWER EXTREMITY ROM:  Active ROM Right eval Left eval  Hip flexion    Hip extension    Hip abduction    Hip adduction    Hip internal rotation    Hip external rotation    Knee flexion    Knee extension    Ankle dorsiflexion 8 -10  Ankle plantarflexion 50 34  Ankle inversion 27 17  Ankle eversion 5 5   (Blank rows = not tested)  LOWER EXTREMITY MMT: not tested at this time  MMT Right eval Left eval  Hip flexion    Hip extension    Hip abduction    Hip adduction    Hip internal rotation    Hip external rotation    Knee flexion    Knee extension    Ankle dorsiflexion    Ankle plantarflexion    Ankle inversion    Ankle eversion     (Blank rows = not tested)  FUNCTIONAL TESTS:  2 minute walk test: 217 feet   GAIT: Distance walked: 217 feet Assistive device utilized: None Level of assistance: Complete Independence Comments: Decreased stride length, gait speed, and heel strike and toe off bilaterally                                                                                                                                TREATMENT DATE:   07/10/24: PT evaluation, patient education, and HEP  PATIENT EDUCATION:  Education details: Healing, prognosis, anatomy, objective findings, precautions secondary to DVT, HEP, and goals for physical therapy Person educated: Patient Education method: Explanation, Demonstration, and Handouts Education comprehension: verbalized understanding and returned demonstration  HOME EXERCISE PROGRAM: Access Code: [email or web address] URL: [email or web address] Date: 07/10/2024 Prepared by: Lacinda Fass  Exercises -  Seated Ankle Pumps  - 2-3 x daily - 7 x weekly - 3 sets - 10 reps - Supine Ankle Pumps  - 2-3 x daily - 7 x weekly - 2 sets - 10 reps - Supine Ankle Inversion Eversion AROM  - 2-3 x daily - 7 x weekly - 2 sets - 10 reps - Ankle Inversion Eversion Towel Slide  - 2-3 x daily - 7 x weekly - 2 sets - 10 reps  ASSESSMENT:  CLINICAL IMPRESSION: Patient is a 39 y.o. female who was seen today for physical therapy evaluation and treatment following a left gastroc aponeurosis lengthening on 05/25/24.  She presented with high pain severity and irritability with left foot and ankle joint mobilizations reproducing her familiar symptoms.  Recommend that she continue with skilled physical therapy to address her impairments to safely return to her prior level of function.  OBJECTIVE IMPAIRMENTS: Abnormal gait, decreased activity tolerance, decreased balance, decreased mobility, difficulty walking, decreased ROM, hypomobility, and pain.   ACTIVITY LIMITATIONS: carrying, lifting, standing, squatting, stairs, and locomotion level  PARTICIPATION LIMITATIONS: meal prep, cleaning, laundry, shopping, community activity, and occupation  PERSONAL FACTORS: Past/current experiences, Profession, Time since onset of injury/illness/exacerbation, and 1-2 comorbidities: Obesity and allergies are also affecting patient's functional outcome.   REHAB POTENTIAL: Good  CLINICAL DECISION MAKING: Evolving/moderate complexity  EVALUATION COMPLEXITY: Moderate   GOALS: Goals reviewed with patient? Yes  SHORT TERM GOALS: Target date: 07/31/24 Patient will be independent with her initial HEP. Baseline: Goal status: INITIAL  2.  Patient will improve her left ankle dorsiflexion to at least neutral for improved gait mechanics. Baseline:  Goal status: INITIAL  LONG TERM GOALS: Target date: 08/21/24  Patient will be independent with her advanced HEP. Baseline:  Goal status: INITIAL  2.  Patient will  improve her 2-minute walk test distance by at least 50 feet for improved community mobility. Baseline:  Goal status: INITIAL  3.  Patient will improve her LEFS score by at least 15 points for improved perceived function with her daily activities. Baseline:  Goal status: INITIAL  4.  Patient will be able to complete her daily activities without her familiar pain exceeding 7/10. Baseline:  Goal status: INITIAL  5.  Patient will be able to safely navigate at least 4 steps with a reciprocal pattern for improved household mobility. Baseline:  Goal status: INITIAL  PLAN:  PT FREQUENCY: 2x/week  PT DURATION: 6 weeks  PLANNED INTERVENTIONS: 97164- PT Re-evaluation, 97750- Physical Performance Testing, 97110-Therapeutic exercises, 97530- Therapeutic activity, 97112- Neuromuscular re-education, [phone]- Self Care, 02859- Manual therapy, [phone]- Gait training, [phone]- Electrical stimulation (unattended), Patient/Family education, Balance training, Stair training, Joint mobilization, Spinal mobilization, Cryotherapy, and Moist heat  PLAN FOR NEXT SESSION: Review and update HEP, joint mobilizations, isometrics, and gait training   Lacinda JAYSON Fass, PT 07/10/2024, 6:44 PM   [employer] Medicare Auth Request Information Treatment Start Date: 07/10/24  Date of referral: 06/26/2024  Referring provider: Janit Thresa HERO, DPM  Referring diagnosis (ICD 10)? [phone]  Treatment diagnosis (ICD 10)? (if different than referring diagnosis) [id number], [phone]   What was this (referring dx) caused by? Surgery (Type: left gastroc aponeurosis lengthening on 05/25/24)  Nature of Condition: Initial Onset (within last 3 months)   Laterality: Lt  Current Functional Measure Score: LEFS 26/80  Objective measurements identify impairments when they are compared to normal values, the uninvolved extremity, and prior level of function.  [x]  Yes  []  No  Objective assessment of functional ability: Severe functional  limitations  Briefly describe symptoms: Sharp left calf pain  How did symptoms start: surgery on 05/25/24  Average pain intensity:  Last 24 hours: 8-9/10  Past week: 8-9/10  How often does the pt experience symptoms? Constantly  How much have the symptoms interfered with usual daily activities? Extremely  How has condition changed since care began at this facility? NA - initial visit  In general, how is the patients overall health? Very Good   BACK PAIN (STarT Back Screening Tool) No  "

## 2024-07-13 ENCOUNTER — Encounter (HOSPITAL_COMMUNITY): Payer: Self-pay

## 2024-07-13 ENCOUNTER — Ambulatory Visit (HOSPITAL_COMMUNITY)

## 2024-07-13 DIAGNOSIS — M79662 Pain in left lower leg: Secondary | ICD-10-CM

## 2024-07-13 DIAGNOSIS — M25672 Stiffness of left ankle, not elsewhere classified: Secondary | ICD-10-CM

## 2024-07-13 NOTE — Therapy (Signed)
 " OUTPATIENT PHYSICAL THERAPY LOWER EXTREMITY EVALUATION   Patient Name: Stacy [name] MRN: [id number] DOB:05-13-1986, 39 y.o., female Today's Date: 07/13/2024  END OF SESSION:  PT End of Session - 07/13/24 1030     Visit Number 2    Number of Visits 12    Date for Recertification  09/15/24    Authorization Type [employer] with [employer] Secondary    Authorization Time Period healthy blue approved 12 visits from 07/10/2024-07/09/2024    PT Start Time 1032    PT Stop Time 1113    PT Time Calculation (min) 41 min    Activity Tolerance Patient limited by pain;No increased pain;Patient tolerated treatment well    Behavior During Therapy [facility] for tasks assessed/performed          Past Medical History:  Diagnosis Date   Anxiety    Carpal tunnel syndrome    Depression    Diverticulosis    Migraine    Obesity    Past Surgical History:  Procedure Laterality Date   CESAREAN SECTION     CESAREAN SECTION     CHOLECYSTECTOMY     MOUTH SURGERY     TUBAL LIGATION     WISDOM TOOTH EXTRACTION     Patient Active Problem List   Diagnosis Date Noted   Pain of left hand 09/15/2022   Acute postoperative pain 07/17/2022   Bilateral carpal tunnel syndrome 06/23/2022   HSV-2 (herpes simplex virus 2) infection 08/07/2015   Precordial pain 05/31/2013   Obesity    Carpal tunnel syndrome     PCP: Duanne Butler DASEN, MD   REFERRING PROVIDER: Janit Thresa HERO, DPM   REFERRING DIAG: Gastrocnemius equinus, left   THERAPY DIAG:  Pain in left lower leg  Stiffness of left ankle, not elsewhere classified  Rationale for Evaluation and Treatment: Rehabilitation  ONSET DATE: 05/25/24  SUBJECTIVE:   SUBJECTIVE STATEMENT: 07/13/24:  Calf is stiff with occasional sharp pain while walking, pain scale 5-6/10.  Has began HEP without questions.  Returns to MD on 07/24/24 and will discuss RTW.  Has compression socks to assist with edema, did not wear today.    Eval:  Patient reports that she  had surgery on 05/25/24. She notes that it has been really hurting since surgery, but it got worse after falling around New Year's Eve. She was going down stairs when she stepped on her step wrong which caused her to fall. She was wearing a boot, but she was told to stop wearing the boot due to finding a blood clot in her left calf. She is now on blood thinners for 3 months for the blood clot.    PERTINENT HISTORY: Obesity and allergies PAIN:  Are you having pain? Yes: NPRS scale: 8/10; worst: 10+/10 Pain location: left calf Pain description: sharp Aggravating factors: dorsiflexion  Relieving factors: none known   PRECAUTIONS: Fall  RED FLAGS: None   WEIGHT BEARING RESTRICTIONS: No  FALLS:  Has patient fallen in last 6 months? Yes. Number of falls 1  LIVING ENVIRONMENT: Lives with: lives with their family Lives in: House/apartment Stairs: Yes: External: 6 steps; has a railing in the middle of her steps Has following equipment at home: None  OCCUPATION: international aid/development worker at [employer]  PLOF: Independent  PATIENT GOALS: reduced pain and be able to walk without limping  NEXT MD VISIT: 07/24/24  OBJECTIVE:  Note: Objective measures were completed at Evaluation unless otherwise noted.  PATIENT SURVEYS:  LEFS  Extreme difficulty/unable (0), Quite a bit of difficulty (1), Moderate difficulty (2), Little difficulty (3), No difficulty (4) Survey date:  07/10/24  Any of your usual work, housework or school activities 2  2. Usual hobbies, recreational or sporting activities 0  3. Getting into/out of the bath 4  4. Walking between rooms 3  5. Putting on socks/shoes 4  6. Squatting  1  7. Lifting an object, like a bag of groceries from the floor 0  8. Performing light activities around your home 4  9. Performing heavy activities around your home 0  10. Getting into/out of a car 4  11. Walking 2 blocks 0  12. Walking 1 mile 0  13. Going up/down 10 stairs (1 flight) 0   14. Standing for 1 hour 0  15.  sitting for 1 hour 0  16. Running on even ground 0  17. Running on uneven ground 0  18. Making sharp turns while running fast 0  19. Hopping  0  20. Rolling over in bed 4  Score total:  26/80     COGNITION: Overall cognitive status: Within functional limits for tasks assessed     SENSATION: Patient reports no numbness or tingling  EDEMA:  Figure 8: L: 58.1 cm  R: 56.0 cm   PALPATION: TTP: left achilles tendon  JOINT MOBILITY:   Left talocrural: hypomobile and painful   Left subtalar: hypomobile and pressure   Left midfoot: hypomobile and tingling   Left forefoot: WFL and nonpainful   LOWER EXTREMITY ROM:  Active ROM Right eval Left eval  Hip flexion    Hip extension    Hip abduction    Hip adduction    Hip internal rotation    Hip external rotation    Knee flexion    Knee extension    Ankle dorsiflexion 8 -10  Ankle plantarflexion 50 34  Ankle inversion 27 17  Ankle eversion 5 5   (Blank rows = not tested)  LOWER EXTREMITY MMT: not tested at this time  MMT Right eval Left eval  Hip flexion    Hip extension    Hip abduction    Hip adduction    Hip internal rotation    Hip external rotation    Knee flexion    Knee extension    Ankle dorsiflexion    Ankle plantarflexion    Ankle inversion    Ankle eversion     (Blank rows = not tested)  FUNCTIONAL TESTS:  2 minute walk test: 217 feet   GAIT: Distance walked: 217 feet Assistive device utilized: None Level of assistance: Complete Independence Comments: Decreased stride length, gait speed, and heel strike and toe off bilaterally                                                                                                                                TREATMENT DATE:   07/13/24: Reviewed goals Educated importance of  HEP compliance for maximal benefits Discussed benefits of compression socks to address edema  Seated:  - Heel raise  - Toe raise  -  inversion  - Eversion Isometric against ball:  -Dorsiflexion  - Plantar flexion  Inversion BAPS L2 PF/Df, InvEv Self- MFR to address scar tissue adhesions medial heel and gastroc region. Gait training with cueing to improve toe push off x 241ft Rockerboard with UE support Rt/Lt x 1 min   07/10/24: PT evaluation, patient education, and HEP  PATIENT EDUCATION:  Education details: Healing, prognosis, anatomy, objective findings, precautions secondary to DVT, HEP, and goals for physical therapy Person educated: Patient Education method: Explanation, Demonstration, and Handouts Education comprehension: verbalized understanding and returned demonstration  HOME EXERCISE PROGRAM: Access Code: [email or web address] URL: [email or web address] Date: 07/10/2024 Prepared by: Lacinda Fass  Exercises - Seated Ankle Pumps  - 2-3 x daily - 7 x weekly - 3 sets - 10 reps - Supine Ankle Pumps  - 2-3 x daily - 7 x weekly - 2 sets - 10 reps - Supine Ankle Inversion Eversion AROM  - 2-3 x daily - 7 x weekly - 2 sets - 10 reps - Ankle Inversion Eversion Towel Slide  - 2-3 x daily - 7 x weekly - 2 sets - 10 reps  07/13/24: - Gastroc Stretch on Wall  - 2 x daily - 7 x weekly - 1 sets - 3 reps - 30 hold  ASSESSMENT:  CLINICAL IMPRESSION: 07/13/24: Reviewed goals and educated importance of HEP compliance for maximal benefits.  Pt able to recall and demonstrate appropriate mechanics with current exercise program.  Noted edema present Bil ankles, reviewed benefits of wearing compression garments to assist with edema and pain, pt stated she owns a pair, encouraged to wear daily.  Added isometric strengthening exercises as well as ankle mobility that was tolerated well.  Pt c/o pulling on incision during Df/PF motions.  Pt educated on scar tissue mobilization to assist with pulling and shown standing gastroc stretch to add to HEP.  Therapist forgot to give copy of gastroc stretch, plan to give copy next  session.  Verbal cueing to improve toe push-off during gait to normalize mechanics, difficult for pt today.  No reports of increased pain at EOS.  Discussed RICE techniques for pain and edema control following therapy.  Eval:Patient is a 39 y.o. female who was seen today for physical therapy evaluation and treatment following a left gastroc aponeurosis lengthening on 05/25/24.  She presented with high pain severity and irritability with left foot and ankle joint mobilizations reproducing her familiar symptoms.  Recommend that she continue with skilled physical therapy to address her impairments to safely return to her prior level of function.  OBJECTIVE IMPAIRMENTS: Abnormal gait, decreased activity tolerance, decreased balance, decreased mobility, difficulty walking, decreased ROM, hypomobility, and pain.   ACTIVITY LIMITATIONS: carrying, lifting, standing, squatting, stairs, and locomotion level  PARTICIPATION LIMITATIONS: meal prep, cleaning, laundry, shopping, community activity, and occupation  PERSONAL FACTORS: Past/current experiences, Profession, Time since onset of injury/illness/exacerbation, and 1-2 comorbidities: Obesity and allergies are also affecting patient's functional outcome.   REHAB POTENTIAL: Good  CLINICAL DECISION MAKING: Evolving/moderate complexity  EVALUATION COMPLEXITY: Moderate   GOALS: Goals reviewed with patient? Yes  SHORT TERM GOALS: Target date: 07/31/24 Patient will be independent with her initial HEP. Baseline: Goal status: INITIAL  2.  Patient will improve her left ankle dorsiflexion to at least neutral for improved gait mechanics. Baseline:  Goal status: INITIAL  LONG TERM GOALS: Target date:  08/21/24  Patient will be independent with her advanced HEP. Baseline:  Goal status: INITIAL  2.  Patient will improve her 2-minute walk test distance by at least 50 feet for improved community mobility. Baseline:  Goal status: INITIAL  3.  Patient will  improve her LEFS score by at least 15 points for improved perceived function with her daily activities. Baseline:  Goal status: INITIAL  4.  Patient will be able to complete her daily activities without her familiar pain exceeding 7/10. Baseline:  Goal status: INITIAL  5.  Patient will be able to safely navigate at least 4 steps with a reciprocal pattern for improved household mobility. Baseline:  Goal status: INITIAL  PLAN:  PT FREQUENCY: 2x/week  PT DURATION: 6 weeks  PLANNED INTERVENTIONS: 97164- PT Re-evaluation, 97750- Physical Performance Testing, 97110-Therapeutic exercises, 97530- Therapeutic activity, [id number]- Neuromuscular re-education, 97535- Self Care, 02859- Manual therapy, [phone]- Gait training, [phone]- Electrical stimulation (unattended), Patient/Family education, Balance training, Stair training, Joint mobilization, Spinal mobilization, Cryotherapy, and Moist heat  PLAN FOR NEXT SESSION:  joint mobilizations, isometrics, and gait training.  Begin standing heel raises as able.  Augustin Mclean, LPTA/CLT; CBIS [phone]  Mclean Augustin Amble, PTA 07/13/2024, 11:27 AM    "

## 2024-07-18 ENCOUNTER — Ambulatory Visit (HOSPITAL_COMMUNITY)

## 2024-07-20 ENCOUNTER — Telehealth: Admitting: Physician Assistant

## 2024-07-20 DIAGNOSIS — K047 Periapical abscess without sinus: Secondary | ICD-10-CM

## 2024-07-20 MED ORDER — AMOXICILLIN-POT CLAVULANATE 875-125 MG PO TABS: 1.0000 | ORAL_TABLET | Freq: Two times a day (BID) | ORAL | 0 refills | Status: AC

## 2024-07-20 NOTE — Progress Notes (Signed)
 E-Visit for Dental Pain  We are sorry that you are not feeling well.  Here is how we plan to help!  Based on what you have shared with me in the questionnaire, it sounds like you have a dental infection. I have prescribed Augmentin  875-125mg  twice a day for 7 days  It is imperative that you see a dentist within 10 days of this eVisit to determine the cause of the dental pain and be sure it is adequately treated  A toothache or tooth pain is caused when the nerve in the root of a tooth or surrounding a tooth is irritated. Dental (tooth) infection, decay, injury, or loss of a tooth are the most common causes of dental pain. Pain may also occur after an extraction (tooth is pulled out). Pain sometimes originates from other areas and radiates to the jaw, thus appearing to be tooth pain.Bacteria growing inside your mouth can contribute to gum disease and dental decay, both of which can cause pain. A toothache occurs from inflammation of the central portion of the tooth called pulp. The pulp contains nerve endings that are very sensitive to pain. Inflammation to the pulp or pulpitis may be caused by dental cavities, trauma, and infection.    HOME CARE:   For toothaches: Avoid very cold or hot foods, because they may make the pain worse. You may get relief from biting on a cotton ball soaked in oil of cloves. You can get oil of cloves at most drug stores.  For jaw pain:  Aspirin  may be helpful for problems in the joint of the jaw in adults. If pain happens every time you open your mouth widely, the temporomandibular joint (TMJ) may be the source of the pain. Yawning or taking a large bite of food may worsen the pain. An appointment with your doctor or dentist will help you find the cause.     GET HELP RIGHT AWAY IF:  You have a high fever or chills If you have had a recent head or face injury and develop headache, light headedness, nausea, vomiting, or other symptoms that concern you after an  injury to your face or mouth, you could have a more serious injury in addition to your dental injury. A facial rash associated with a toothache: This condition may improve with medication. Contact your doctor for them to decide what is appropriate. Any jaw pain occurring with chest pain: Although jaw pain is most commonly caused by dental disease, it is sometimes referred pain from other areas. People with heart disease, especially people who have had stents placed, people with diabetes, or those who have had heart surgery may have jaw pain as a symptom of heart attack or angina. If your jaw or tooth pain is associated with lightheadedness, sweating, or shortness of breath, you should see a doctor as soon as possible. Trouble swallowing or excessive pain or bleeding from gums: If you have a history of a weakened immune system, diabetes, or steroid use, you may be more susceptible to infections. Infections can often be more severe and extensive or caused by unusual organisms. Dental and gum infections in people with these conditions may require more aggressive treatment. An abscess may need draining or IV antibiotics, for example.  MAKE SURE YOU   Understand these instructions. Will watch your condition. Will get help right away if you are not doing well or get worse.  Thank you for choosing an e-visit.  Your e-visit answers were reviewed by a board certified  advanced clinical practitioner to complete your personal care plan. Depending upon the condition, your plan could have included both over the counter or prescription medications.  Please review your pharmacy choice. Make sure the pharmacy is open so you can pick up prescription now. If there is a problem, you may contact your provider through [employer] and have the prescription routed to another pharmacy.  Your safety is important to us . If you have drug allergies check your prescription carefully.   For the next 24 hours you can use  MyChart to ask questions about today's visit, request a non-urgent call back, or ask for a work or school excuse. You will get an email in the next two days asking about your experience. I hope that your e-visit has been valuable and will speed your recovery.  I have spent 5 minutes in review of e-visit questionnaire, review and updating patient chart, medical decision making and response to patient.   Elsie Velma Lunger, PA-C

## 2024-07-21 ENCOUNTER — Ambulatory Visit (HOSPITAL_COMMUNITY)

## 2024-07-24 ENCOUNTER — Encounter: Payer: Self-pay | Admitting: Podiatry

## 2024-07-24 ENCOUNTER — Encounter: Admitting: Podiatry

## 2024-07-25 ENCOUNTER — Ambulatory Visit (INDEPENDENT_AMBULATORY_CARE_PROVIDER_SITE_OTHER): Admitting: Podiatry

## 2024-07-25 ENCOUNTER — Encounter (HOSPITAL_COMMUNITY): Payer: Self-pay

## 2024-07-25 ENCOUNTER — Ambulatory Visit (HOSPITAL_COMMUNITY)

## 2024-07-25 ENCOUNTER — Encounter: Payer: Self-pay | Admitting: Podiatry

## 2024-07-25 DIAGNOSIS — M62462 Contracture of muscle, left lower leg: Secondary | ICD-10-CM

## 2024-07-25 DIAGNOSIS — M25672 Stiffness of left ankle, not elsewhere classified: Secondary | ICD-10-CM

## 2024-07-25 DIAGNOSIS — M79662 Pain in left lower leg: Secondary | ICD-10-CM

## 2024-07-25 NOTE — Progress Notes (Signed)
" ° °  Chief Complaint  Patient presents with   Routine Post Op    POV # 4 DOS 05/25/24 LT EPF, LT GASTROCNEMIUS RECESSION, pt is here to f/u on left foot, still has some pain and swelling to the foot, states she is still in physical therapy had her 3rd session today, states incision site is tender to touch, wears compression sleeve during the day to help with swelling, thinks she may need a short boot to help with the pain.    Subjective:  Patient presents today status post gastroc aponeurosis lengthening with EPF left.  DOS: 05/25/2024.  Positive DVT diagnosed 06/26/2024.  She continues to have some swelling to the leg.  She is going to physical therapy currently  Past Medical History:  Diagnosis Date   Anxiety    Carpal tunnel syndrome    Depression    Diverticulosis    Migraine    Obesity     Past Surgical History:  Procedure Laterality Date   CESAREAN SECTION     CESAREAN SECTION     CHOLECYSTECTOMY     MOUTH SURGERY     TUBAL LIGATION     WISDOM TOOTH EXTRACTION      Allergies  Allergen Reactions   Coconut Flavoring Agent (Non-Screening) Other (See Comments)    Mouth/throat lesions    Pineapple Other (See Comments)    Mouth/throat leasions    Objective/Physical Exam Neurovascular status intact.  Incisions are nicely healed.  Edema noted throughout the lower extremity.  There is also some sensitivity with burning sensation especially to the posterior incision site.  Scar tissue also noted to the incision sites  Radiographic exam LT foot 06/26/2024 Normal osseous mineralization.  Joint spaces preserved.  No acute fracture.  Impression: Negative  VAS US  LOWER EXTREMITY VENOUS (DVT) (Accession [id number]) (Order [id number]) Vascular Ultrasound Date: 06/26/2024 Department: [facility]trasound at [facility] of [place] Released By: Elodie Duwaine DEL, NT Authorizing: Janit Thresa HERO, DPM  Summary:  RIGHT:  - No evidence of common femoral vein obstruction.   LEFT:  - Findings consistent with acute deep vein thrombosis involving one of the left posterior tibial veins.  - No cystic structure found in the popliteal fossa.   Assessment: 1. s/p EPF + gastroc aponeurosis lengthening LT. DOS: 05/25/2024 2. +DVT left  Plan of Care:  -Patient was evaluated.   -Continue physical therapy at home outpatient rehab - Currently on Eliquis  5 mg daily x 3 months prescribed on 06/26/2024 -I will plan to see her in approximately 2 months after her follow-up with vascular -Today we are extending her FMLA for an additional 12 weeks.  Thresa EMERSON Janit, DPM [facility]  Dr. Thresa EMERSON Janit, DPM    [address] [place], [place] [address]                Office [phone]  Fax [phone]      "

## 2024-07-28 ENCOUNTER — Ambulatory Visit (HOSPITAL_COMMUNITY)

## 2024-07-28 NOTE — Telephone Encounter (Signed)
 pt lft form for leave. I cld and lft mess on her vmail to confirm dates. She has RTW 07/30/24 on form, but Dr. Janit has RTW 10/16/24-addl 12wks. and will confirm if work note is needed. see prev notes

## 2024-08-01 ENCOUNTER — Ambulatory Visit (HOSPITAL_COMMUNITY)

## 2024-08-03 ENCOUNTER — Ambulatory Visit (HOSPITAL_COMMUNITY)

## 2024-08-08 ENCOUNTER — Ambulatory Visit (HOSPITAL_COMMUNITY)

## 2024-08-10 ENCOUNTER — Ambulatory Visit (HOSPITAL_COMMUNITY)

## 2024-08-15 ENCOUNTER — Ambulatory Visit (HOSPITAL_COMMUNITY)

## 2024-08-17 ENCOUNTER — Ambulatory Visit (HOSPITAL_COMMUNITY)

## 2024-08-22 ENCOUNTER — Ambulatory Visit (HOSPITAL_COMMUNITY)

## 2024-08-24 ENCOUNTER — Ambulatory Visit (HOSPITAL_COMMUNITY)

## 2024-08-29 ENCOUNTER — Ambulatory Visit (HOSPITAL_COMMUNITY)

## 2024-09-20 ENCOUNTER — Ambulatory Visit: Admitting: Student-PharmD

## 2024-09-26 ENCOUNTER — Ambulatory Visit: Admitting: Podiatry
